# Patient Record
Sex: Male | Born: 1995 | Race: Black or African American | Hispanic: No | Marital: Single | Smoking: Current every day smoker
Health system: Southern US, Community
[De-identification: ages and names within clinical notes are randomized; demographics above are authoritative.]

## PROBLEM LIST (undated history)

## (undated) DIAGNOSIS — T7840XA Allergy, unspecified, initial encounter: Secondary | ICD-10-CM

## (undated) DIAGNOSIS — F32A Depression, unspecified: Secondary | ICD-10-CM

## (undated) DIAGNOSIS — K611 Rectal abscess: Secondary | ICD-10-CM

## (undated) DIAGNOSIS — R51 Headache: Secondary | ICD-10-CM

## (undated) DIAGNOSIS — F909 Attention-deficit hyperactivity disorder, unspecified type: Secondary | ICD-10-CM

## (undated) DIAGNOSIS — F419 Anxiety disorder, unspecified: Secondary | ICD-10-CM

## (undated) DIAGNOSIS — F329 Major depressive disorder, single episode, unspecified: Secondary | ICD-10-CM

## (undated) DIAGNOSIS — F319 Bipolar disorder, unspecified: Secondary | ICD-10-CM

## (undated) DIAGNOSIS — D849 Immunodeficiency, unspecified: Secondary | ICD-10-CM

## (undated) HISTORY — PX: TYMPANOSTOMY TUBE PLACEMENT: SHX32

---
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
## 2016-03-20 ENCOUNTER — Ambulatory Visit (HOSPITAL_COMMUNITY)
Admission: EM | Admit: 2016-03-20 | Discharge: 2016-03-20 | Disposition: A | Discharge status: Home / Self Care | Payer: Managed Care, Other (non HMO) | Attending: Emergency Medicine | Admitting: Emergency Medicine

## 2016-03-20 ENCOUNTER — Encounter (HOSPITAL_COMMUNITY): Payer: Self-pay | Admitting: *Deleted

## 2016-03-20 DIAGNOSIS — H66001 Acute suppurative otitis media without spontaneous rupture of ear drum, right ear: Secondary | ICD-10-CM

## 2016-03-20 HISTORY — DX: Major depressive disorder, single episode, unspecified: F32.9

## 2016-03-20 HISTORY — DX: Depression, unspecified: F32.A

## 2016-03-20 HISTORY — DX: Bipolar disorder, unspecified: F31.9

## 2016-03-20 MED ORDER — CEFDINIR 300 MG PO CAPS: 300.0000 mg | ORAL_CAPSULE | Freq: Two times a day (BID) | ORAL | 0 refills | Status: AC

## 2016-03-20 NOTE — Discharge Instructions (Signed)
You have an ear infection. Take Omnicef twice a day for 10 days. You can do Tylenol, Advil, or ibuprofen as needed for pain. This should improve over the next 24-48 hours. Follow-up as needed.

## 2016-03-20 NOTE — ED Triage Notes (Signed)
Started approx 1 wk ago with "head cold" and minor cough.  Last night started with bilat earache - R>L with decreased hearing.  Denies fevers.  Has been taking Benadryl, Contact.

## 2016-03-20 NOTE — ED Provider Notes (Signed)
[facility]    CSN: [id number] Arrival date & time: 03/20/16  1231     History   Chief Complaint Chief Complaint  Patient presents with  . Otalgia  . Nasal Congestion    HPI Devon Hernandez is a 20 y.o. male.   HPI   Patient is a 20 y.o. male who presents with cough and congestion x 1 week and otalgia since last night.  Cough started out productive of green sputum which has now turned yellow. Congestion is associated with clear nasal drainage. Right ear pain began last night, followed by left ear pain.  Right ear pain has been a constant 9/10 pain while left comes and goes.  Also notes loss of hearing in right ear. Nothing aggravates or relieves the pain. Has taken Benadryl with little relief. Hx of ear infection 7 months ago which he was treated for in the ED. No known sick contacts. No history of asthma. Smokes two black and milds per day x 5 years. Denies any fever, ST, chest pain, shortness of breath, abdominal pain, nausea, vomiting, diarrhea.   Past Medical History:  Diagnosis Date  . ADHD (attention deficit hyperactivity disorder)   . Allergy   . Anxiety   . Bipolar disorder ([place])   . Depression   . [id number])     Patient Active Problem List   Diagnosis Date Noted  . Polysubstance abuse 07/11/2013  . Altered mental status 07/09/2013  . MDD (major depressive disorder), recurrent episode, severe ([place]) 01/18/2013  . ADHD (attention deficit hyperactivity disorder), combined type 01/18/2013  . Conduct disorder, 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    Past Surgical History:  Procedure Laterality Date  . TYMPANOSTOMY TUBE PLACEMENT         Home Medications    Prior to Admission medications   Medication Sig Start Date End Date Taking? Authorizing Provider  cholecalciferol (VITAMIN D) 1000 units tablet Take 1,000 Units by mouth daily.   Yes Historical Provider, MD  docusate sodium (COLACE) 100 MG capsule Take 100 mg by mouth 2 (two) times daily as  needed for mild constipation.    Yes Historical Provider, MD  fluticasone (FLONASE) 50 MCG/ACT nasal spray Place 1 spray into both nostrils daily. As needed for congestion 09/11/15  Yes Roxanna Mew, PA-C  lamoTRIgine (LAMICTAL) 100 MG tablet Take 100 mg by mouth 2 (two) times daily.   Yes Historical Provider, MD  lithium carbonate 300 MG capsule Take 300 mg by mouth 3 (three) times daily with meals.   Yes Historical Provider, MD  MIRTAZAPINE PO Take by mouth.   Yes Historical Provider, MD  cefdinir (OMNICEF) 300 MG capsule Take 1 capsule (300 mg total) by mouth 2 (two) times daily. 03/20/16 03/30/16  Melony Overly, MD  Misc. Devices (HIBICLENS HAND PUMP 32OZ) MISC Bathe or shower using Hibiclens once a week 02/14/14   Lutricia Feil, PA    Family History Family History  Problem Relation Age of Onset  . Depression Mother   . Hypertension Mother   . Depression Father     Hx: PTSD  . Diabetes Maternal Aunt   . AVM Maternal Grandmother   . Aneurysm Maternal Grandmother   . Seizures Maternal Grandmother     Social History Social History  Substance Use Topics  . Smoking status: Current Every Day Smoker    Types: Cigars  . Smokeless tobacco: Never Used  . Alcohol use Yes     Comment: occasionally  Allergies   Amoxicillin and Penicillins   Review of Systems Review of Systems  Constitutional: Negative for activity change, chills, fatigue and fever.  HENT: Positive for congestion, ear pain, hearing loss and postnasal drip. Negative for ear discharge, sinus pain, sneezing, sore throat and tinnitus.   Eyes: Negative for pain.  Respiratory: Positive for cough. Negative for chest tightness, shortness of breath and wheezing.   Cardiovascular: Negative for chest pain.  Gastrointestinal: Negative for abdominal pain, constipation, diarrhea, nausea and vomiting.  Genitourinary: Negative for dysuria and frequency.  Musculoskeletal: Negative for neck stiffness.    Neurological: Negative for dizziness.     Physical Exam Triage Vital Signs ED Triage Vitals  Enc Vitals Group     BP 03/20/16 1334 132/83     Pulse Rate 03/20/16 1334 60     Resp 03/20/16 1334 16     Temp 03/20/16 1334 98.9 F (37.2 C)     Temp Source 03/20/16 1334 Oral     SpO2 03/20/16 1334 100 %     Weight --      Height --      Head Circumference --      Peak Flow --      Pain Score 03/20/16 1340 9     Pain Loc --      Pain Edu? --      Excl. in [place]? --    No data found.   Updated Vital Signs BP 132/83 (BP Location: Left Arm)   Pulse 60   Temp 98.9 F (37.2 C) (Oral)   Resp 16   SpO2 100%   Visual Acuity Right Eye Distance:   Left Eye Distance:   Bilateral Distance:    Right Eye Near:   Left Eye Near:    Bilateral Near:     Physical Exam  Constitutional: He is oriented to person, place, and time. He appears well-developed and well-nourished. No distress.  HENT:  Head: Normocephalic and atraumatic.  Right Ear: External ear and ear canal normal. There is tenderness. Tympanic membrane is erythematous and bulging. A middle ear effusion is present. Decreased hearing is noted.  Left Ear: External ear and ear canal normal. Tympanic membrane is erythematous. A middle ear effusion (clear effusion) is present.  Eyes: EOM are normal. Right eye exhibits no discharge. Left eye exhibits no discharge.  Neck: Normal range of motion. Neck supple.  Cardiovascular: Normal rate, regular rhythm and normal heart sounds.   Pulmonary/Chest: Effort normal and breath sounds normal. No respiratory distress. He has no wheezes. He has no rales.  Musculoskeletal: Normal range of motion.  Lymphadenopathy:    He has no cervical adenopathy.  Neurological: He is alert and oriented to person, place, and time.  Skin: Skin is warm and dry. He is not diaphoretic.  Psychiatric: He has a normal mood and affect. His behavior is normal. Judgment and thought content normal.  Nursing note and  vitals reviewed.    UC Treatments / Results  Labs (all labs ordered are listed, but only abnormal results are displayed) Labs Reviewed - No data to display  EKG  EKG Interpretation None       Radiology No results found.  Procedures Procedures (including critical care time)  Medications Ordered in UC Medications - No data to display   Initial Impression / Assessment and Plan / UC Course  I have reviewed the triage vital signs and the nursing notes.  Pertinent labs & imaging results that were available during my care of  the patient were reviewed by me and considered in my medical decision making (see chart for details).   Clinical Course    20 y.o. Presents for cough, congestion, and ear pain.  Cefdinir 300 mg bid x 10 days prescribed today for OM.   Advised patient that he has an ear infection. Take Omnicef twice a day for 10 days. Advised that he can do Tylenol, Advil, or ibuprofen as needed for pain. This should improve over the next 24-48 hours. Follow-up as needed.  Patient seen and examined with PA student. Note reviewed and edited as needed.  Final Clinical Impressions(s) / UC Diagnoses   Final diagnoses:  Acute suppurative otitis media of right ear without spontaneous rupture of tympanic membrane, recurrence not specified    New Prescriptions Discharge Medication List as of 03/20/2016  2:34 PM       Melony Overly, MD 03/20/16 2056

## 2016-04-21 ENCOUNTER — Emergency Department (HOSPITAL_COMMUNITY)
Admission: EM | Admit: 2016-04-21 | Discharge: 2016-04-21 | Disposition: A | Discharge status: Home / Self Care | Payer: Managed Care, Other (non HMO) | Attending: Emergency Medicine | Admitting: Emergency Medicine

## 2016-04-21 ENCOUNTER — Encounter (HOSPITAL_COMMUNITY): Payer: Self-pay | Admitting: Nurse Practitioner

## 2016-04-21 DIAGNOSIS — K6289 Other specified diseases of anus and rectum: Secondary | ICD-10-CM | POA: Diagnosis present

## 2016-04-21 DIAGNOSIS — F17290 Nicotine dependence, other tobacco product, uncomplicated: Secondary | ICD-10-CM | POA: Insufficient documentation

## 2016-04-21 DIAGNOSIS — F909 Attention-deficit hyperactivity disorder, unspecified type: Secondary | ICD-10-CM | POA: Insufficient documentation

## 2016-04-21 DIAGNOSIS — K5900 Constipation, unspecified: Secondary | ICD-10-CM | POA: Diagnosis not present

## 2016-04-21 DIAGNOSIS — K644 Residual hemorrhoidal skin tags: Secondary | ICD-10-CM

## 2016-04-21 MED ORDER — HYDROCORTISONE 2.5 % RE CREA: TOPICAL_CREAM | RECTAL | 0 refills | Status: DC

## 2016-04-21 MED ORDER — DOCUSATE SODIUM 100 MG PO CAPS: 100.0000 mg | ORAL_CAPSULE | Freq: Two times a day (BID) | ORAL | 0 refills | Status: DC

## 2016-04-21 MED ORDER — MAGNESIUM CITRATE PO SOLN
1.0000 | Freq: Once | ORAL | Status: AC
  Administered 2016-04-21: 1 via ORAL
  Filled 2016-04-21: qty 296

## 2016-04-21 NOTE — ED Triage Notes (Signed)
Pt presents with c/o rectal pain.he reports a four day history of constipation and straining to move his bowels. He noticed a painful "bump" to rectal area a few days ago that has become increasingly painful and swollen since he has been straining.

## 2016-04-21 NOTE — ED Notes (Signed)
I&D tray at bedside.

## 2016-04-21 NOTE — Discharge Instructions (Signed)
It was my pleasure taking care of you today!   Increase hydration and follow a high fiber diet. Use a stool softener such as Colace to help relieve pain with bowel movements. Sitz baths and Epsom salt baths will also help improve symptoms. Apply topical hydrocortisone cream twice daily.   If symptoms persist longer than 1 week, please follow up with your primary care provider. Please return to ER for new or worsening symptoms, any additional concerns.

## 2016-04-21 NOTE — ED Notes (Signed)
Pt is in stable condition upon d/c and ambulates from ED. 

## 2016-04-21 NOTE — ED Provider Notes (Signed)
[facility] DEPT Provider Note   CSN: [id number] Arrival date & time: 04/21/16  1115   By signing my name below, I, [name], attest that this documentation has been prepared under the direction and in the presence of [facility], PA-C. Electronically Signed: [name], Scribe. 04/21/16. 11:58 AM. History   Chief Complaint Chief Complaint  Patient presents with  . Rectal Pain    HPI HPI Comments: Devon Hernandez is a 20 y.o. male who presents to the Emergency Department complaining of a constant, gradually worsening painful bump to the right side of his rectum that he noticed 5 days ago. Pt has associated constipation with his last BM occurring 7 days ago. He is passing gas. Pt denies similar rectal pain in the past. He states standing alleviates his pain; sitting down or touching the area aggravates his pain. Pt denies any fevers or chills.   The history is provided by the patient. No language interpreter was used.    Past Medical History:  Diagnosis Date  . ADHD (attention deficit hyperactivity disorder)   . Allergy   . Anxiety   . Bipolar disorder ([place]on Hernandez)   . Depression   . [id number])     Patient Active Problem List   Diagnosis Date Noted  . Polysubstance abuse 07/11/2013  . Altered mental status 07/09/2013  . MDD (major depressive disorder), recurrent episode, severe (Devon Hernandez) 01/18/2013  . ADHD (attention deficit hyperactivity disorder), combined type 01/18/2013  . Conduct disorder, 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    Past Surgical History:  Procedure Laterality Date  . TYMPANOSTOMY TUBE PLACEMENT         Home Medications    Prior to Admission medications   Medication Sig Start Date End Date Taking? Authorizing Provider  cholecalciferol (VITAMIN D) 1000 units tablet Take 1,000 Units by mouth daily.    Historical Provider, MD  docusate sodium (COLACE) 100 MG capsule Take 1 capsule (100 mg total) by mouth every 12 (twelve) hours. 04/21/16   Jaime Pilcher  Ward, PA-C  fluticasone (FLONASE) 50 MCG/ACT nasal spray Place 1 spray into both nostrils daily. As needed for congestion 09/11/15   Roxanna Mew, PA-C  hydrocortisone (ANUSOL-HC) 2.5 % rectal cream Apply rectally 2 times daily 04/21/16   [facility] Ward, PA-C  lamoTRIgine (LAMICTAL) 100 MG tablet Take 100 mg by mouth 2 (two) times daily.    Historical Provider, MD  lithium carbonate 300 MG capsule Take 300 mg by mouth 3 (three) times daily with meals.    Historical Provider, MD  MIRTAZAPINE PO Take by mouth.    Historical Provider, MD  Misc. Devices (HIBICLENS HAND PUMP 32OZ) MISC Bathe or shower using Hibiclens once a week 02/14/14   Lutricia Feil, PA    Family History Family History  Problem Relation Age of Onset  . Depression Mother   . Hypertension Mother   . Depression Father     Hx: PTSD  . Diabetes Maternal Aunt   . AVM Maternal Grandmother   . Aneurysm Maternal Grandmother   . Seizures Maternal Grandmother     Social History Social History  Substance Use Topics  . Smoking status: Current Every Day Smoker    Types: Cigars  . Smokeless tobacco: Never Used  . Alcohol use Yes     Comment: occasionally     Allergies   Amoxicillin and Penicillins   Review of Systems Review of Systems  Constitutional: Negative for chills and fever.  Gastrointestinal: Positive for constipation and rectal  pain. Negative for abdominal pain and diarrhea.     Physical Exam Updated Vital Signs BP 137/86 (BP Location: Right Arm)   Pulse 67   Temp 97.9 F (36.6 C) (Oral)   Resp 14   SpO2 99%   Physical Exam  Constitutional: He is oriented to person, place, and time. He appears well-developed and well-nourished. No distress.  HENT:  Head: Normocephalic and atraumatic.  Cardiovascular: Normal rate, regular rhythm and normal heart sounds.   No murmur heard. Pulmonary/Chest: Effort normal and breath sounds normal. No respiratory distress.  Abdominal: Soft. He  exhibits no distension. There is no tenderness.  Genitourinary: Rectal exam shows external hemorrhoid and tenderness. Rectal exam shows no internal hemorrhoid and no fissure.  Musculoskeletal: He exhibits no edema.  Neurological: He is alert and oriented to person, place, and time.  Skin: Skin is warm and dry.  Nursing note and vitals reviewed.    ED Treatments / Results   DIAGNOSTIC STUDIES: Oxygen Saturation is 99% on room air, normal by my interpretation.    COORDINATION OF CARE: 11:59 AM Discussed treatment plan with pt at bedside and pt agreed to plan.   Labs (all labs ordered are listed, but only abnormal results are displayed) Labs Reviewed - No data to display  EKG  EKG Interpretation None       Radiology No results found.  Procedures Procedures (including critical care time)  Medications Ordered in ED Medications  magnesium citrate solution 1 Bottle (1 Bottle Oral Given 04/21/16 1207)     Initial Impression / Assessment and Plan / ED Course  I have reviewed the triage vital signs and the nursing notes.  Pertinent labs & imaging results that were available during my care of the patient were reviewed by me and considered in my medical decision making (see chart for details).  Clinical Course    Devon Hernandez is a 20 y.o. male who presents to ED for external hemorrhoid and constipation. Bowel health discussed. Mag citrate given in ED. Stool softener and hydrocortisone rectal cream provided. Miralax PRN. Follow up with PCP if symptoms persist. Return precautions discussed and all questions answered.    Final Clinical Impressions(s) / ED Diagnoses   Final diagnoses:  External hemorrhoid  Constipation, unspecified constipation type    New Prescriptions Discharge Medication List as of 04/21/2016 12:01 PM    START taking these medications   Details  hydrocortisone (ANUSOL-HC) 2.5 % rectal cream Apply rectally 2 times daily, Print       I personally  performed the services described in this documentation, which was scribed in my presence. The recorded information has been reviewed and is accurate.    [facility] Ward, PA-C 04/21/16 1323    Tanna Furry, MD 04/27/16 [phone]

## 2016-06-20 ENCOUNTER — Ambulatory Visit (HOSPITAL_COMMUNITY)
Admission: EM | Admit: 2016-06-20 | Discharge: 2016-06-20 | Disposition: A | Discharge status: Home / Self Care | Payer: PRIVATE HEALTH INSURANCE | Attending: Internal Medicine | Admitting: Internal Medicine

## 2016-06-20 ENCOUNTER — Encounter (HOSPITAL_COMMUNITY): Payer: Self-pay | Admitting: Emergency Medicine

## 2016-06-20 DIAGNOSIS — H00014 Hordeolum externum left upper eyelid: Secondary | ICD-10-CM | POA: Diagnosis not present

## 2016-06-20 MED ORDER — ERYTHROMYCIN 5 MG/GM OP OINT: TOPICAL_OINTMENT | OPHTHALMIC | 0 refills | Status: DC

## 2016-06-20 NOTE — ED Provider Notes (Signed)
CSN: [id number]     Arrival date & time 06/20/16  1806 History   None    Chief Complaint  Patient presents with  . Eye Pain   (Consider location/radiation/quality/duration/timing/severity/associated sxs/prior Treatment) 21 year old male patient presents to clinic with 24-hour history of swelling in the left eye lid. He denies any changes in his patient, no eye pain, no light sensitivity, no eye discharge or crusting. He denies any trauma to the area, however he has been wearing nonprescription contact lenses for cosmetic purposes.   The history is provided by the patient.    Past Medical History:  Diagnosis Date  . ADHD (attention deficit hyperactivity disorder)   . Allergy   . Anxiety   . Bipolar disorder ([place])   . Depression   . [id number])    Past Surgical History:  Procedure Laterality Date  . TYMPANOSTOMY TUBE PLACEMENT     Family History  Problem Relation Age of Onset  . Depression Mother   . Hypertension Mother   . Depression Father     Hx: PTSD  . Diabetes Maternal Aunt   . AVM Maternal Grandmother   . Aneurysm Maternal Grandmother   . Seizures Maternal Grandmother    Social History  Substance Use Topics  . Smoking status: Current Every Day Smoker    Types: Cigars  . Smokeless tobacco: Never Used  . Alcohol use Yes     Comment: occasionally    Review of Systems  Reason unable to perform ROS: as covered in HPI.  All other systems reviewed and are negative.   Allergies  Amoxicillin and Penicillins  Home Medications   Prior to Admission medications   Medication Sig Start Date End Date Taking? Authorizing Provider  erythromycin ophthalmic ointment Place a 1/2 inch ribbon of ointment into the lower eyelid 2-3 times a day. 06/20/16   Barnet Glasgow, NP   Meds Ordered and Administered this Visit  Medications - No data to display  BP 126/73 (BP Location: Right Arm)   Pulse 68   Temp 98.4 F (36.9 C) (Oral)   Resp 16   SpO2 100%  No data  found.   Physical Exam  Constitutional: He is oriented to person, place, and time. He appears well-developed and well-nourished. No distress.  HENT:  Head: Normocephalic and atraumatic.  Right Ear: External ear normal.  Left Ear: External ear normal.  Mouth/Throat: Oropharynx is clear and moist.  Eyes: Pupils are equal, round, and reactive to light. Lids are everted and swept, no foreign bodies found. Left eye exhibits hordeolum. Left eye exhibits no discharge and no exudate. Right conjunctiva is not injected. Right conjunctiva has no hemorrhage. Left conjunctiva is not injected. Left conjunctiva has no hemorrhage. Right eye exhibits normal extraocular motion. Left eye exhibits normal extraocular motion.  Hordeolum present left upper eyelid.  Neurological: He is alert and oriented to person, place, and time.  Skin: Skin is warm and dry. Capillary refill takes less than 2 seconds. He is not diaphoretic.  Psychiatric: He has a normal mood and affect.  Nursing note and vitals reviewed.   Urgent Care Course     Procedures (including critical care time)  Labs Review Labs Reviewed - No data to display  Imaging Review No results found.   Visual Acuity Review  Right Eye Distance: 20/50 Left Eye Distance: 20/40 Bilateral Distance: 20/40  Right Eye Near:   Left Eye Near:    Bilateral Near:         MDM  1. Hordeolum externum of left upper eyelid    You have a style in your left upper eye lid. Apply a warm compress for 15 minutes at a time 4 times a day. I have also prescribed erythromycin eye ointment, apply a 1/2 inch strip to your eye 2-3 times a day. Should your symptoms fail to resolve, follow up with your primary care provider or an ophthalmologist as needed.      Barnet Glasgow, NP 06/20/16 2010

## 2016-06-20 NOTE — Discharge Instructions (Signed)
You have a style in your left upper eye lid. Apply a warm compress for 15 minutes at a time 4 times a day. I have also prescribed erythromycin eye ointment, apply a 1/2 inch strip to your eye 2-3 times a day. Should your symptoms fail to resolve, follow up with your primary care provider or an ophthalmologist as needed.

## 2016-06-20 NOTE — ED Triage Notes (Signed)
The patient presented to the [facility] with a complaint of left eye pain and swelling that started today.

## 2017-02-28 ENCOUNTER — Ambulatory Visit (HOSPITAL_COMMUNITY)
Admission: EM | Admit: 2017-02-28 | Discharge: 2017-02-28 | Disposition: A | Discharge status: Home / Self Care | Payer: 59 | Attending: Family Medicine | Admitting: Family Medicine

## 2017-02-28 ENCOUNTER — Encounter (HOSPITAL_COMMUNITY): Payer: Self-pay | Admitting: Emergency Medicine

## 2017-02-28 DIAGNOSIS — Z9889 Other specified postprocedural states: Secondary | ICD-10-CM | POA: Diagnosis not present

## 2017-02-28 DIAGNOSIS — K629 Disease of anus and rectum, unspecified: Secondary | ICD-10-CM | POA: Insufficient documentation

## 2017-02-28 DIAGNOSIS — F419 Anxiety disorder, unspecified: Secondary | ICD-10-CM | POA: Insufficient documentation

## 2017-02-28 DIAGNOSIS — Z8249 Family history of ischemic heart disease and other diseases of the circulatory system: Secondary | ICD-10-CM | POA: Insufficient documentation

## 2017-02-28 DIAGNOSIS — F319 Bipolar disorder, unspecified: Secondary | ICD-10-CM | POA: Insufficient documentation

## 2017-02-28 DIAGNOSIS — R4182 Altered mental status, unspecified: Secondary | ICD-10-CM | POA: Diagnosis not present

## 2017-02-28 DIAGNOSIS — Z833 Family history of diabetes mellitus: Secondary | ICD-10-CM | POA: Insufficient documentation

## 2017-02-28 DIAGNOSIS — Z818 Family history of other mental and behavioral disorders: Secondary | ICD-10-CM | POA: Insufficient documentation

## 2017-02-28 DIAGNOSIS — Z79899 Other long term (current) drug therapy: Secondary | ICD-10-CM | POA: Diagnosis not present

## 2017-02-28 DIAGNOSIS — F17290 Nicotine dependence, other tobacco product, uncomplicated: Secondary | ICD-10-CM | POA: Diagnosis not present

## 2017-02-28 DIAGNOSIS — K649 Unspecified hemorrhoids: Secondary | ICD-10-CM | POA: Insufficient documentation

## 2017-02-28 DIAGNOSIS — Z820 Family history of epilepsy and other diseases of the nervous system: Secondary | ICD-10-CM | POA: Diagnosis not present

## 2017-02-28 DIAGNOSIS — F909 Attention-deficit hyperactivity disorder, unspecified type: Secondary | ICD-10-CM | POA: Insufficient documentation

## 2017-02-28 DIAGNOSIS — Z880 Allergy status to penicillin: Secondary | ICD-10-CM | POA: Diagnosis not present

## 2017-02-28 NOTE — Discharge Instructions (Signed)
May continue to use preparation h to rectal lesion for symptoms. We will notify you have any positive test results. Please follow on your MyChart. Keep stools soft. Push fluids. If symptoms persist or do not improve follow up with your primary care provider

## 2017-02-28 NOTE — ED Provider Notes (Signed)
[facility]    CSN: [id number] Arrival date & time: 02/28/17  1326     History   Chief Complaint Chief Complaint  Patient presents with  . Hemorrhoids    HPI Devon Hernandez is a 21 y.o. male.   Simran presents with complaints of rectal pain and itching which has been present for the past three weeks. Pain with having BM and with wiping of the area. He states he has multiple loose stools a day. Has had hemorrhoids in the past but states this feels different. Tried preparation h and it caused burning so has not used since. Denies abdominal pain nausea vomiting. He has felt fatigued. Denies penile lesions or discharge. Without fevers. He has not been sexually active in 6 months. He has been sexually active with males, and has engaged in anal intercourse. Denies previous std's or known exposure.   ROS per HPI.       Past Medical History:  Diagnosis Date  . ADHD (attention deficit hyperactivity disorder)   . Allergy   . Anxiety   . Bipolar disorder ([place])   . Depression   . NWGNFAOZ(308.6)     Patient Active Problem List   Diagnosis Date Noted  . Polysubstance abuse ([place]) 07/11/2013  . Altered mental status 07/09/2013  . MDD (major depressive disorder), recurrent episode, severe ([place]) 01/18/2013  . ADHD (attention deficit hyperactivity disorder), combined type 01/18/2013  . Conduct disorder, 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    Past Surgical History:  Procedure Laterality Date  . TYMPANOSTOMY TUBE PLACEMENT         Home Medications    Prior to Admission medications   Medication Sig Start Date End Date Taking? Authorizing Provider  LamoTRIgine (LAMICTAL PO) Take by mouth.   Yes [provider]  LITHIUM PO Take by mouth.   Yes [provider]  Mirtazapine (REMERON PO) Take by mouth.   Yes [provider]  erythromycin ophthalmic ointment Place a 1/2 inch ribbon of ointment into the lower eyelid 2-3 times a day. 06/20/16   Barnet Glasgow, NP    Family History Family History  Problem Relation Age of Onset  . Depression Mother   . Hypertension Mother   . Depression Father        Hx: PTSD  . Diabetes Maternal Aunt   . AVM Maternal Grandmother   . Aneurysm Maternal Grandmother   . Seizures Maternal Grandmother     Social History Social History  Substance Use Topics  . Smoking status: Current Every Day Smoker    Types: Cigars  . Smokeless tobacco: Never Used  . Alcohol use Yes     Comment: occasionally     Allergies   Amoxicillin and Penicillins   Review of Systems Review of Systems   Physical Exam Triage Vital Signs ED Triage Vitals  Enc Vitals Group     BP 02/28/17 1356 (!) 121/58     Pulse Rate 02/28/17 1356 84     Resp 02/28/17 1356 18     Temp 02/28/17 1356 98.8 F (37.1 C)     Temp Source 02/28/17 1356 Oral     SpO2 02/28/17 1356 100 %     Weight --      Height --      Head Circumference --      Peak Flow --      Pain Score 02/28/17 1355 5     Pain Loc --      Pain Edu? --  Excl. in GC? --    No data found.   Updated Vital Signs BP (!) 121/58 (BP Location: Left Arm)   Pulse 84   Temp 98.8 F (37.1 C) (Oral)   Resp 18   SpO2 100%   Visual Acuity Right Eye Distance:   Left Eye Distance:   Bilateral Distance:    Right Eye Near:   Left Eye Near:    Bilateral Near:     Physical Exam  Constitutional: He is oriented to person, place, and time. He appears well-developed and well-nourished. No distress.  Cardiovascular: Normal rate and regular rhythm.   Pulmonary/Chest: Effort normal and breath sounds normal.  Abdominal: Soft. There is no tenderness.  Genitourinary:     Genitourinary Comments: Approximately 1.5 cm flat raised lesion near rectum, appears to be open and moist. Light pink in color.   Neurological: He is oriented to person, place, and time.  Skin: Skin is warm and dry.  Vitals reviewed.    UC Treatments / Results  Labs (all labs ordered are  listed, but only abnormal results are displayed) Labs Reviewed  HSV CULTURE AND TYPING  URINE CYTOLOGY ANCILLARY ONLY    EKG  EKG Interpretation None       Radiology No results found.  Procedures Procedures (including critical care time)  Medications Ordered in UC Medications - No data to display   Initial Impression / Assessment and Plan / UC Course  I have reviewed the triage vital signs and the nursing notes.  Pertinent labs & imaging results that were available during my care of the patient were reviewed by me and considered in my medical decision making (see chart for details).     Swabbed lesion for HSV at this time. Discussed external hemorrhoid vs herpes. Obtained urine STI screen as well as patient unsure when last screen was completed. Recommended supportive cares at this time, preparation h, sitz baths. Will notify of any positive findings. If symptoms worsen or do not improve in the next week to return to be seen or to follow up with PCP. Patient verbalized understanding and agreeable to plan.    Zigmund Gottron, NP 02/28/2017 2:52 PM   Final Clinical Impressions(s) / UC Diagnoses   Final diagnoses:  Rectal lesion    New Prescriptions New Prescriptions   No medications on file     Controlled Substance Prescriptions [place] Controlled Substance Registry consulted? Not Applicable   Zigmund Gottron, NP 02/28/17 1452

## 2017-02-28 NOTE — ED Notes (Signed)
Patient just walked into lobby from outside

## 2017-02-28 NOTE — ED Triage Notes (Addendum)
History of hemorrhoids and says this is what he thinks is going on today.  Patient has multiple complaints.

## 2017-03-01 LAB — URINE CYTOLOGY ANCILLARY ONLY
Chlamydia: NEGATIVE
Neisseria Gonorrhea: NEGATIVE
TRICH (WINDOWPATH): NEGATIVE

## 2017-03-02 LAB — HSV CULTURE AND TYPING

## 2017-03-30 ENCOUNTER — Ambulatory Visit (INDEPENDENT_AMBULATORY_CARE_PROVIDER_SITE_OTHER): Payer: 59 | Admitting: Emergency Medicine

## 2017-03-30 ENCOUNTER — Other Ambulatory Visit: Payer: Self-pay

## 2017-03-30 ENCOUNTER — Encounter: Payer: Self-pay | Admitting: Emergency Medicine

## 2017-03-30 VITALS — BP 120/80 | HR 86 | Temp 98.2°F | Resp 16 | Ht 70.0 in | Wt 131.4 lb

## 2017-03-30 DIAGNOSIS — A630 Anogenital (venereal) warts: Secondary | ICD-10-CM | POA: Diagnosis not present

## 2017-03-30 DIAGNOSIS — B079 Viral wart, unspecified: Secondary | ICD-10-CM | POA: Diagnosis not present

## 2017-03-30 DIAGNOSIS — K6289 Other specified diseases of anus and rectum: Secondary | ICD-10-CM | POA: Insufficient documentation

## 2017-03-30 NOTE — Patient Instructions (Addendum)
     IF you received an x-ray today, you will receive an invoice from [facility]. Please contact [facility] at [phone] with questions or concerns regarding your invoice.   IF you received labwork today, you will receive an invoice from [facility]. Please contact [facility] at [phone] with questions or concerns regarding your invoice.   Our billing staff will not be able to assist you with questions regarding bills from these companies.  You will be contacted with the lab results as soon as they are available. The fastest way to get your results is to activate your My Chart account. Instructions are located on the last page of this paperwork. If you have not heard from [place] regarding the results in 2 weeks, please contact this office.      Human Papillomavirus Human papillomavirus (HPV) is the most common sexually transmitted infection (STI). It is easy to pass it from person to person (contagious). HPV can cause cervical cancer, anal cancer, and genital warts. The genital warts can be seen and felt. Also, there may be wartlike regions in the throat. HPV may not have any symptoms. It is possible to have HPV for a long time and not know it. You may pass HPV on to others without knowing it. Follow these instructions at home:  Take medicines as told by your doctor.  Use over-the-counter creams for itching as told by your doctor.  Keep all follow-up visits. Make sure to get Pap tests as told by your doctor.  Do not touch or scratch the warts.  Do not treat genital warts with medicines used for treating hand warts.  Do not have sex while you are getting treatment.  Do not douche or use tampons during treatment of HPV.  Tell your sex partner about your infection because he or she may also need treatment.  If you get pregnant, tell your doctor that you had HPV. Your doctor will watch your pregnancy closely. This is important to keep your baby safe.  After  treatment, use condoms during sex to prevent future infections.  Have only one sex partner.  Have a sex partner who does not have other sex partners. Contact a doctor if:  The treated skin is red, swollen, or painful.  You have a fever.  You feel ill.  You feel lumps or pimple-like areas in and around your genital area.  You have bleeding of the vagina or the area that was treated.  You have pain during sex. This information is not intended to replace advice given to you by your health care provider. Make sure you discuss any questions you have with your health care provider. Document Released: 03/31/2008 Document Revised: 09/24/2015 Document Reviewed: 07/24/2013 Elsevier Interactive Patient Education  2017 [employer].

## 2017-03-30 NOTE — Progress Notes (Signed)
Devon Hernandez 21 y.o.   Chief Complaint  Patient presents with  . Rectal Pain    x one month, itches     HISTORY OF PRESENT ILLNESS: This is a 21 y.o. male complaining of itchy tender growths to perianal and scrotal areas x several months.  HPI   Prior to Admission medications   Medication Sig Start Date End Date Taking? Authorizing Provider  LamoTRIgine (LAMICTAL PO) Take by mouth.   Yes [provider]  LITHIUM PO Take by mouth.   Yes [provider]  Mirtazapine (REMERON PO) Take by mouth.   Yes [provider]  erythromycin ophthalmic ointment Place a 1/2 inch ribbon of ointment into the lower eyelid 2-3 times a day. Patient not taking: Reported on 03/30/2017 06/20/16   Barnet Glasgow, NP    Allergies  Allergen Reactions  . Amoxicillin Hives and Diarrhea  . Penicillins     Per Dad    Patient Active Problem List   Diagnosis Date Noted  . Polysubstance abuse ([place]) 07/11/2013  . Altered mental status 07/09/2013  . MDD (major depressive disorder), recurrent episode, severe ([place]) 01/18/2013  . ADHD (attention deficit hyperactivity disorder), combined type 01/18/2013  . Conduct disorder, 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    Past Medical History:  Diagnosis Date  . ADHD (attention deficit hyperactivity disorder)   . Allergy   . Anxiety   . Bipolar disorder ([place])   . Depression   . UXLKGMWN(027.2)     Past Surgical History:  Procedure Laterality Date  . TYMPANOSTOMY TUBE PLACEMENT      Social History   Socioeconomic History  . Marital status: Single    Spouse name: Not on file  . Number of children: Not on file  . Years of education: Not on file  . Highest education level: Not on file  Social Needs  . Financial resource strain: Not on file  . Food insecurity - worry: Not on file  . Food insecurity - inability: Not on file  . Transportation needs - medical: Not on file  . Transportation needs - non-medical: Not on file    Occupational History  . Not on file  Tobacco Use  . Smoking status: Current Every Day Smoker    Types: Cigars  . Smokeless tobacco: Never Used  Substance and Sexual Activity  . Alcohol use: Yes    Comment: occasionally  . Drug use: No    Comment: Hx trying THC and smoking marijuana per record  . Sexual activity: No  Other Topics Concern  . Not on file  Social History Narrative   Mom currently has full custody but Devon Hernandez has gone to live with his dad. Tonight was the first night that he was at dad's house. He admitted to marijuana use tonight, unclear when. Smoke exposure at home. No pets at home.    Family History  Problem Relation Age of Onset  . Depression Mother   . Hypertension Mother   . Depression Father        Hx: PTSD  . Diabetes Maternal Aunt   . AVM Maternal Grandmother   . Aneurysm Maternal Grandmother   . Seizures Maternal Grandmother      Review of Systems  Constitutional: Negative for chills and fever.  HENT: Negative for sore throat.   Eyes: Negative for discharge and redness.  Respiratory: Negative for cough and shortness of breath.   Cardiovascular: Negative for palpitations.  Gastrointestinal: Negative for abdominal pain, diarrhea, nausea and vomiting.  Genitourinary: Negative for dysuria, frequency and hematuria.  Skin: Positive for itching and rash.  Neurological: Negative.  Negative for dizziness and headaches.  Endo/Heme/Allergies: Negative.   All other systems reviewed and are negative.  Vitals:   03/30/17 1601  BP: 120/80  Pulse: 86  Resp: 16  Temp: 98.2 F (36.8 C)  SpO2: 98%     Physical Exam  Constitutional: He appears well-developed and well-nourished.  HENT:  Head: Normocephalic and atraumatic.  Eyes: Conjunctivae are normal. Pupils are equal, round, and reactive to light.  Neck: Normal range of motion.  Cardiovascular: Normal rate.  Pulmonary/Chest: Effort normal.  Genitourinary:  Genitourinary Comments: +HPV-like lesions  to perianal area; scrotal area shows hypopigmented areas and some raised lesions   Musculoskeletal: Normal range of motion.  Neurological: He is alert.  Skin: Skin is warm and dry. Capillary refill takes less than 2 seconds.  Psychiatric: He has a normal mood and affect. His behavior is normal.  Vitals reviewed.    ASSESSMENT & PLAN: Devon Hernandez was seen today for rectal pain.  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 Please contact [facility] Radiology at [phone] with questions or concerns regarding your invoice.   IF you received labwork today, you will receive an invoice from [facility]. Please contact [facility] at [phone] with questions or concerns regarding your invoice.   Our billing staff will not be able to assist you with questions regarding bills from these companies.  You will be contacted with the lab results as soon as they are available. The fastest way to get your results is to activate your My Chart account. Instructions are located on the last page of this paperwork. If you have not heard from [place] regarding the results in 2 weeks, please contact this office.      Human Papillomavirus Human papillomavirus (HPV) is the most common sexually transmitted infection (STI). It is easy to pass it from person to person (contagious). HPV can cause cervical cancer, anal cancer, and genital warts. The genital warts can be seen and felt. Also, there may be wartlike regions in the throat. HPV may not have any symptoms. It is possible to have HPV for a long time and not know it. You may pass HPV on to others without knowing it. Follow these instructions at home:  Take medicines as told by your doctor.  Use  over-the-counter creams for itching as told by your doctor.  Keep all follow-up visits. Make sure to get Pap tests as told by your doctor.  Do not touch or scratch the warts.  Do not treat genital warts with medicines used for treating hand warts.  Do not have sex while you are getting treatment.  Do not douche or use tampons during treatment of HPV.  Tell your sex partner about your infection because he or she may also need treatment.  If you get pregnant, tell your doctor that you had HPV. Your doctor will watch your pregnancy closely. This is important to keep your baby safe.  After treatment, use condoms during sex to prevent future infections.  Have only one sex partner.  Have a sex partner who does not have other sex partners. Contact a  doctor if:  The treated skin is red, swollen, or painful.  You have a fever.  You feel ill.  You feel lumps or pimple-like areas in and around your genital area.  You have bleeding of the vagina or the area that was treated.  You have pain during sex. This information is not intended to replace advice given to you by your health care provider. Make sure you discuss any questions you have with your health care provider. Document Released: 03/31/2008 Document Revised: 09/24/2015 Document Reviewed: 07/24/2013 Elsevier Interactive Patient Education  2017 Elsevier Inc.      Agustina Caroli, MD [facility]

## 2017-04-03 ENCOUNTER — Telehealth: Payer: Self-pay | Admitting: Emergency Medicine

## 2017-04-03 NOTE — Telephone Encounter (Signed)
Copied from [place] [phone]. Topic: Referral - Status >> Apr 03, 2017 11:39 AM Burnis Medin, NT wrote: Reason for CRM: Pt. Called back about wanting to check the status of his referral to the Dermatologist. Also pt wanted to know if the referral can be set up with someone that accepts medicaid. Pt would like a call back.

## 2017-04-06 NOTE — Telephone Encounter (Signed)
We are unable to refer with medicaid due to [place] not being primary care on medicaid card. I let pt know this and he was okay with using his other insurance. Sending to [place] and [place] [facility] on 12/6

## 2017-09-09 ENCOUNTER — Ambulatory Visit (HOSPITAL_COMMUNITY)
Admission: EM | Admit: 2017-09-09 | Discharge: 2017-09-09 | Disposition: A | Discharge status: Home / Self Care | Payer: Medicaid Other | Attending: Family Medicine | Admitting: Family Medicine

## 2017-09-09 ENCOUNTER — Other Ambulatory Visit: Payer: Self-pay

## 2017-09-09 ENCOUNTER — Encounter (HOSPITAL_COMMUNITY): Payer: Self-pay

## 2017-09-09 DIAGNOSIS — J029 Acute pharyngitis, unspecified: Secondary | ICD-10-CM

## 2017-09-09 MED ORDER — AZITHROMYCIN 250 MG PO TABS: 250.0000 mg | ORAL_TABLET | Freq: Every day | ORAL | 0 refills | Status: DC

## 2017-09-09 MED ORDER — IPRATROPIUM BROMIDE 0.06 % NA SOLN: 2.0000 | Freq: Four times a day (QID) | NASAL | 0 refills | Status: DC

## 2017-09-09 NOTE — ED Triage Notes (Signed)
Pt presents today with sore throat and what feels like a lump on the right side of his throat. Hurts to swallow and eat. Has had this problem for a month. Has a cough but no other symptoms. States last night he though his throat was going to close.

## 2017-09-09 NOTE — ED Provider Notes (Signed)
[place]    CSN: [id number] Arrival date & time: 09/09/17  1300     History   Chief Complaint Chief Complaint  Patient presents with  . Sore Throat    HPI Devon Hernandez is a 22 y.o. male.   22 year old male comes in for 1 month history of URI symptoms. States has had sore throat with worse to the right side. Also with cough. Denies rhinorrhea, nasal congestion. Denies fever, chills, night sweats. States felt worse last night as if throat was closing and came in for evaluation. Denies trouble breathing, shortness of breath, wheezing. Denies tripoding, drooling. Took aleve with good improvement. Current every day smoker.      Past Medical History:  Diagnosis Date  . ADHD (attention deficit hyperactivity disorder)   . Allergy   . Anxiety   . Bipolar disorder ([place])   . Depression   . MBTDHRCB(638.4)     Patient Active Problem List   Diagnosis Date Noted  . 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 Perianal wart 03/30/2017  . Polysubstance abuse ([place]) 07/11/2013  . Altered mental status 07/09/2013  . MDD (major depressive disorder), recurrent episode, severe ([place]) 01/18/2013  . ADHD (attention deficit hyperactivity disorder), combined type 01/18/2013  . Conduct disorder, 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    Past Surgical History:  Procedure Laterality Date  . TYMPANOSTOMY TUBE PLACEMENT         Home Medications    Prior to Admission medications   Medication Sig Start Date End Date Taking? Authorizing Provider  LamoTRIgine (LAMICTAL PO) Take by mouth.   Yes [provider]  LITHIUM PO Take by mouth.   Yes [provider]  Mirtazapine (REMERON PO) Take by mouth.   Yes [provider]  azithromycin (ZITHROMAX) 250 MG tablet Take 1 tablet (250 mg total) by mouth daily. Take first 2 tablets together, then 1 every day until finished. 09/09/17   Tasia Catchings, Jasmaine Rochel V, PA-C  ipratropium (ATROVENT) 0.06 % nasal spray Place 2  sprays into both nostrils 4 (four) times daily. 09/09/17   Ok Edwards, PA-C    Family History Family History  Problem Relation Age of Onset  . Depression Mother   . Hypertension Mother   . Depression Father        Hx: PTSD  . Diabetes Maternal Aunt   . AVM Maternal Grandmother   . Aneurysm Maternal Grandmother   . Seizures Maternal Grandmother     Social History Social History   Tobacco Use  . Smoking status: Current Every Day Smoker    Types: Cigars  . Smokeless tobacco: Never Used  Substance Use Topics  . Alcohol use: Yes    Comment: occasionally  . Drug use: No    Types: Marijuana    Comment: Hx trying THC and smoking marijuana per record     Allergies   Amoxicillin and Penicillins   Review of Systems Review of Systems  Reason unable to perform ROS: See HPI as above.     Physical Exam Triage Vital Signs ED Triage Vitals [09/09/17 1442]  Enc Vitals Group     BP 120/90     Pulse Rate 96     Resp 16     Temp 98.3 F (36.8 C)     Temp src      SpO2 100 %     Weight      Height      Head Circumference  Peak Flow      Pain Score 4     Pain Loc      Pain Edu?      Excl. in [place]?    No data found.  Updated Vital Signs BP 120/90   Pulse 96   Temp 98.3 F (36.8 C)   Resp 16   SpO2 100%   Physical Exam  Constitutional: He is oriented to person, place, and time. He appears well-developed and well-nourished.  Non-toxic appearance. He does not appear ill. No distress.  HENT:  Head: Normocephalic and atraumatic.  Right Ear: External ear and ear canal normal. Tympanic membrane is not erythematous and not bulging.  Left Ear: External ear and ear canal normal. Tympanic membrane is not erythematous and not bulging.  Nose: Nose normal. Right sinus exhibits no maxillary sinus tenderness and no frontal sinus tenderness. Left sinus exhibits no maxillary sinus tenderness and no frontal sinus tenderness.  Mouth/Throat: Uvula is midline, oropharynx is clear and  moist and mucous membranes are normal. No trismus in the jaw. No uvula swelling. Tonsils are 2+ on the right. Tonsils are 2+ on the left. No tonsillar exudate.  Bilateral TM opaque.   Eyes: Pupils are equal, round, and reactive to light. Conjunctivae are normal.  Neck: Normal range of motion. Neck supple.  Cardiovascular: Normal rate, regular rhythm and normal heart sounds. Exam reveals no gallop and no friction rub.  No murmur heard. Pulmonary/Chest: Effort normal and breath sounds normal. He has no decreased breath sounds. He has no wheezes. He has no rhonchi. He has no rales.  Lymphadenopathy:       Head (right side): Submandibular adenopathy present.       Head (left side): Submandibular adenopathy present.  Neurological: He is alert and oriented to person, place, and time.  Skin: Skin is warm and dry.  Psychiatric: He has a normal mood and affect. His behavior is normal. Judgment normal.     UC Treatments / Results  Labs (all labs ordered are listed, but only abnormal results are displayed) Labs Reviewed - No data to display  EKG None  Radiology No results found.  Procedures Procedures (including critical care time)  Medications Ordered in UC Medications - No data to display  Initial Impression / Assessment and Plan / UC Course  I have reviewed the triage vital signs and the nursing notes.  Pertinent labs & imaging results that were available during my care of the patient were reviewed by me and considered in my medical decision making (see chart for details).    Patient nontoxic appearance. Given 1 month history, will cover for tonsillitis with azithromycin. Other symptomatic treatment discussed. Push fluids. Return precautions given. Patient expresses understanding and agrees to plan.  Final Clinical Impressions(s) / UC Diagnoses   Final diagnoses:  Pharyngitis, unspecified etiology    ED Prescriptions    Medication Sig Dispense Auth. Provider   azithromycin  (ZITHROMAX) 250 MG tablet Take 1 tablet (250 mg total) by mouth daily. Take first 2 tablets together, then 1 every day until finished. 6 tablet Jomari Bartnik V, PA-C   ipratropium (ATROVENT) 0.06 % nasal spray Place 2 sprays into both nostrils 4 (four) times daily. 15 mL Tobin Chad, [place] 09/09/17 1510

## 2017-09-09 NOTE — Discharge Instructions (Signed)
Azithromycin as directed. Start atrovent nasal spray nasal congestion/drainage. You can use over the counter nasal saline rinse such as neti pot for nasal congestion. Keep hydrated, your urine should be clear to pale yellow in color. Tylenol/motrin for fever and pain. Monitor for any worsening of symptoms, chest pain, shortness of breath, wheezing, swelling of the throat, follow up for reevaluation.   For sore throat try using a honey-based tea. Use 3 teaspoons of honey with juice squeezed from half lemon. Place shaved pieces of ginger into 1/2-1 cup of water and warm over stove top. Then mix the ingredients and repeat every 4 hours as needed.

## 2018-01-17 ENCOUNTER — Other Ambulatory Visit: Payer: Self-pay

## 2018-01-17 ENCOUNTER — Encounter (HOSPITAL_COMMUNITY): Payer: Self-pay | Admitting: Emergency Medicine

## 2018-01-17 ENCOUNTER — Emergency Department (HOSPITAL_COMMUNITY)
Admission: EM | Admit: 2018-01-17 | Discharge: 2018-01-18 | Discharge status: Against Medical Advice | Payer: 59 | Attending: Emergency Medicine | Admitting: Emergency Medicine

## 2018-01-17 DIAGNOSIS — Z79899 Other long term (current) drug therapy: Secondary | ICD-10-CM | POA: Insufficient documentation

## 2018-01-17 DIAGNOSIS — Z5329 Procedure and treatment not carried out because of patient's decision for other reasons: Secondary | ICD-10-CM | POA: Insufficient documentation

## 2018-01-17 DIAGNOSIS — R112 Nausea with vomiting, unspecified: Secondary | ICD-10-CM | POA: Insufficient documentation

## 2018-01-17 DIAGNOSIS — R1031 Right lower quadrant pain: Secondary | ICD-10-CM | POA: Insufficient documentation

## 2018-01-17 DIAGNOSIS — F17290 Nicotine dependence, other tobacco product, uncomplicated: Secondary | ICD-10-CM | POA: Diagnosis not present

## 2018-01-17 LAB — URINALYSIS, ROUTINE W REFLEX MICROSCOPIC
Bilirubin Urine: NEGATIVE
Glucose, UA: NEGATIVE mg/dL
Hgb urine dipstick: NEGATIVE
KETONES UR: 20 mg/dL — AB
Leukocytes, UA: NEGATIVE
Nitrite: NEGATIVE
PH: 9 — AB (ref 5.0–8.0)
Protein, ur: 100 mg/dL — AB
Specific Gravity, Urine: 1.026 (ref 1.005–1.030)

## 2018-01-17 NOTE — ED Triage Notes (Signed)
Pt c/o lower abdominal pain and vomiting that started yesterday. States it has been taking him longer to initiate urine stream, also reports that his last BM was yesterday morning.

## 2018-01-18 ENCOUNTER — Emergency Department (HOSPITAL_COMMUNITY): Payer: 59

## 2018-01-18 ENCOUNTER — Emergency Department (HOSPITAL_COMMUNITY)
Admission: EM | Admit: 2018-01-18 | Discharge: 2018-01-18 | Disposition: A | Discharge status: Home / Self Care | Payer: 59 | Attending: Emergency Medicine | Admitting: Emergency Medicine

## 2018-01-18 ENCOUNTER — Encounter (HOSPITAL_COMMUNITY): Payer: Self-pay | Admitting: Emergency Medicine

## 2018-01-18 DIAGNOSIS — Z79899 Other long term (current) drug therapy: Secondary | ICD-10-CM | POA: Insufficient documentation

## 2018-01-18 DIAGNOSIS — R112 Nausea with vomiting, unspecified: Secondary | ICD-10-CM | POA: Insufficient documentation

## 2018-01-18 DIAGNOSIS — R1084 Generalized abdominal pain: Secondary | ICD-10-CM | POA: Insufficient documentation

## 2018-01-18 DIAGNOSIS — F17210 Nicotine dependence, cigarettes, uncomplicated: Secondary | ICD-10-CM | POA: Diagnosis not present

## 2018-01-18 LAB — COMPREHENSIVE METABOLIC PANEL
ALT: 12 U/L (ref 0–44)
AST: 23 U/L (ref 15–41)
Albumin: 4.5 g/dL (ref 3.5–5.0)
Alkaline Phosphatase: 54 U/L (ref 38–126)
Anion gap: 11 (ref 5–15)
BUN: 6 mg/dL (ref 6–20)
CHLORIDE: 101 mmol/L (ref 98–111)
CO2: 27 mmol/L (ref 22–32)
Calcium: 9.5 mg/dL (ref 8.9–10.3)
Creatinine, Ser: 0.92 mg/dL (ref 0.61–1.24)
Glucose, Bld: 128 mg/dL — ABNORMAL HIGH (ref 70–99)
POTASSIUM: 3.2 mmol/L — AB (ref 3.5–5.1)
Sodium: 139 mmol/L (ref 135–145)
Total Bilirubin: 1.5 mg/dL — ABNORMAL HIGH (ref 0.3–1.2)
Total Protein: 7.2 g/dL (ref 6.5–8.1)

## 2018-01-18 LAB — LIPASE, BLOOD: LIPASE: 25 U/L (ref 11–51)

## 2018-01-18 LAB — CBC
HCT: 44.1 % (ref 39.0–52.0)
HEMOGLOBIN: 14.4 g/dL (ref 13.0–17.0)
MCH: 29.1 pg (ref 26.0–34.0)
MCHC: 32.7 g/dL (ref 30.0–36.0)
MCV: 89.3 fL (ref 78.0–100.0)
Platelets: 229 10*3/uL (ref 150–400)
RBC: 4.94 MIL/uL (ref 4.22–5.81)
RDW: 13.2 % (ref 11.5–15.5)
WBC: 23.8 10*3/uL — ABNORMAL HIGH (ref 4.0–10.5)

## 2018-01-18 MED ORDER — ONDANSETRON 4 MG PO TBDP: 4.0000 mg | ORAL_TABLET | Freq: Three times a day (TID) | ORAL | 0 refills | Status: DC | PRN

## 2018-01-18 MED ORDER — IOPAMIDOL (ISOVUE-300) INJECTION 61%
100.0000 mL | Freq: Once | INTRAVENOUS | Status: AC | PRN
  Administered 2018-01-18: 100 mL via INTRAVENOUS

## 2018-01-18 MED ORDER — MORPHINE SULFATE (PF) 4 MG/ML IV SOLN
4.0000 mg | Freq: Once | INTRAVENOUS | Status: DC
  Filled 2018-01-18: qty 1

## 2018-01-18 MED ORDER — ONDANSETRON HCL 4 MG/2ML IJ SOLN
4.0000 mg | Freq: Once | INTRAMUSCULAR | Status: DC
  Filled 2018-01-18: qty 2

## 2018-01-18 MED ORDER — SODIUM CHLORIDE 0.9 % IV BOLUS
500.0000 mL | Freq: Once | INTRAVENOUS | Status: AC
  Administered 2018-01-18: 500 mL via INTRAVENOUS

## 2018-01-18 MED ORDER — DICYCLOMINE HCL 20 MG PO TABS: 20.0000 mg | ORAL_TABLET | Freq: Two times a day (BID) | ORAL | 0 refills | Status: DC | PRN

## 2018-01-18 MED ORDER — MORPHINE SULFATE (PF) 4 MG/ML IV SOLN
4.0000 mg | Freq: Once | INTRAVENOUS | Status: AC
  Administered 2018-01-18: 4 mg via INTRAVENOUS
  Filled 2018-01-18: qty 1

## 2018-01-18 MED ORDER — IOPAMIDOL (ISOVUE-300) INJECTION 61%
INTRAVENOUS | Status: AC
  Filled 2018-01-18: qty 100

## 2018-01-18 NOTE — ED Notes (Signed)
Pt family member is complaining of time. States that they have been here since 10:30. I explained that we had a critical patient we were taking care of. She said that she knows because she was in the hallway and could see that something was going on. Patient then states that she is going to start recording everything and brought out her phone. I then went to charge and got security. Patient then said he was going to leave AMA if she left. She said she was still turning video in to the news.

## 2018-01-18 NOTE — ED Notes (Signed)
Pt mother started to yell at staff about not treating her son yet. Pt mother advised staff was in another critical pt room who was needing life saving measures. Pt mother then got her cell phone out and started to record this nurse and other staff stating that we are not doing anything. Agricultural consultant and security notified of situation. Pt was educated on the need to stay for his scan and medications. Pt then got up and left AMA. MD Ward notified.

## 2018-01-18 NOTE — ED Notes (Signed)
Patient is tolerating oral fluids well, reports no N/V.

## 2018-01-18 NOTE — Discharge Instructions (Signed)
It was my pleasure taking care of you today!   Zofran as needed for nausea. Bentyl as needed for abdominal pain.   At this time, there does not appear to be an acute, emergent cause for your abdominal pain. However, this does not mean that your abdominal pain may not become an emergency in the future. It is VERY important that you monitor your symptoms and return to the Emergency Department if you develop any of the following symptoms:  You keep throwing up and can't keep fluids down.  You pass bloody or black tarry stools.  There is bright red blood in the stool. You do not seem to be getting better.  You have any questions or concerns.

## 2018-01-18 NOTE — ED Notes (Signed)
Assisted patient with ambulation to bathroom.

## 2018-01-18 NOTE — ED Provider Notes (Signed)
[facility] EMERGENCY DEPARTMENT Provider Note   CSN: [id number] Arrival date & time: 01/17/18  2245     History   Chief Complaint Chief Complaint  Patient presents with  . Abdominal Pain  . Emesis    HPI Devon Hernandez is a 22 y.o. male.  Patient presents to the emergency department with a chief complaint of abdominal pain.  He reports right lower and generalized lower abdominal pain which is been worsening since yesterday.  He reports associated nausea and vomiting.  He denies any dysuria or hematuria, but does report some urinary hesitancy.  States last bowel movement was yesterday.  He denies any fevers or chills.  He has not taken anything for his symptoms.  He denies any other associated symptoms.  The history is provided by the patient. No language interpreter was used.    Past Medical History:  Diagnosis Date  . ADHD (attention deficit hyperactivity disorder)   . Allergy   . Anxiety   . Bipolar disorder ([place])   . Depression   . VOJJKKXF(818.2)     Patient Active Problem List   Diagnosis Date Noted  . 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 Perianal wart 03/30/2017  . Polysubstance abuse ([place]) 07/11/2013  . Altered mental status 07/09/2013  . MDD (major depressive disorder), recurrent episode, severe ([place]) 01/18/2013  . ADHD (attention deficit hyperactivity disorder), combined type 01/18/2013  . Conduct disorder, 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    Past Surgical History:  Procedure Laterality Date  . TYMPANOSTOMY TUBE PLACEMENT          Home Medications    Prior to Admission medications   Medication Sig Start Date End Date Taking? Authorizing Provider  azithromycin (ZITHROMAX) 250 MG tablet Take 1 tablet (250 mg total) by mouth daily. Take first 2 tablets together, then 1 every day until finished. 09/09/17   Tasia Catchings, Amy V, PA-C  ipratropium (ATROVENT) 0.06 % nasal spray Place 2 sprays into both nostrils 4 (four) times  daily. 09/09/17   Tasia Catchings, Amy V, PA-C  LamoTRIgine (LAMICTAL PO) Take by mouth.    [provider]  LITHIUM PO Take by mouth.    [provider]  Mirtazapine (REMERON PO) Take by mouth.    [provider]    Family History Family History  Problem Relation Age of Onset  . Depression Mother   . Hypertension Mother   . Depression Father        Hx: PTSD  . Diabetes Maternal Aunt   . AVM Maternal Grandmother   . Aneurysm Maternal Grandmother   . Seizures Maternal Grandmother     Social History Social History   Tobacco Use  . Smoking status: Current Every Day Smoker    Types: Cigars  . Smokeless tobacco: Never Used  Substance Use Topics  . Alcohol use: Yes    Comment: occasionally  . Drug use: No    Types: Marijuana    Comment: Hx trying THC and smoking marijuana per record     Allergies   Amoxicillin and Penicillins   Review of Systems Review of Systems  All other systems reviewed and are negative.    Physical Exam Updated Vital Signs BP 120/74 (BP Location: Right Arm)   Pulse 66   Temp 98.9 F (37.2 C) (Oral)   Resp 16   SpO2 98%   Physical Exam  Constitutional: He is oriented to person, place, and time. He appears well-developed and well-nourished.  HENT:  Head: Normocephalic and atraumatic.  Eyes: Pupils are equal, round, and reactive to light. Conjunctivae and EOM are normal. Right eye exhibits no discharge. Left eye exhibits no discharge. No scleral icterus.  Neck: Normal range of motion. Neck supple. No JVD present.  Cardiovascular: Normal rate, regular rhythm and normal heart sounds. Exam reveals no gallop and no friction rub.  No murmur heard. Pulmonary/Chest: Effort normal and breath sounds normal. No respiratory distress. He has no wheezes. He has no rales. He exhibits no tenderness.  Abdominal: Soft. He exhibits no distension and no mass. There is tenderness in the right lower quadrant. There is no rebound and no guarding.    Musculoskeletal: Normal range of motion. He exhibits no edema or tenderness.  Neurological: He is alert and oriented to person, place, and time.  Skin: Skin is warm and dry.  Psychiatric: He has a normal mood and affect. His behavior is normal. Judgment and thought content normal.  Nursing note and vitals reviewed.    ED Treatments / Results  Labs (all labs ordered are listed, but only abnormal results are displayed) Labs Reviewed  COMPREHENSIVE METABOLIC PANEL - Abnormal; Notable for the following components:      Result Value   Potassium 3.2 (*)    Glucose, Bld 128 (*)    Total Bilirubin 1.5 (*)    All other components within normal limits  CBC - Abnormal; Notable for the following components:   WBC 23.8 (*)    All other components within normal limits  URINALYSIS, ROUTINE W REFLEX MICROSCOPIC - Abnormal; Notable for the following components:   APPearance CLOUDY (*)    pH 9.0 (*)    Ketones, ur 20 (*)    Protein, ur 100 (*)    Bacteria, UA RARE (*)    All other components within normal limits  LIPASE, BLOOD    EKG None  Radiology No results found.  Procedures Procedures (including critical care time)  Medications Ordered in ED Medications  morphine 4 MG/ML injection 4 mg (has no administration in time range)  ondansetron (ZOFRAN) injection 4 mg (has no administration in time range)     Initial Impression / Assessment and Plan / ED Course  I have reviewed the triage vital signs and the nursing notes.  Pertinent labs & imaging results that were available during my care of the patient were reviewed by me and considered in my medical decision making (see chart for details).     Patient with right lower quadrant pain and generalized lower abdominal pain since yesterday.  Pain is been worsening.  Significant leukocytosis to 23.8.  Will check CT to evaluate for appendicitis.  Delay in care due to code in adjacent room which required all resources.  Patient and  family aware.  3:18 AM Notified by RN that patient left AMA after being encouraged to leave by mother.  Informed that the mother started filming other patients and staff. I was informed after the fact and was not present for any the events that transpired. Mother and patient were aware that we were getting a CT to rule out appendicitis.   Final Clinical Impressions(s) / ED Diagnoses   Final diagnoses:  Right lower quadrant abdominal pain  Nausea and vomiting, intractability of vomiting not specified, unspecified vomiting type    ED Discharge Orders    None       Montine Circle, PA-C 01/18/18 [place], Delice Bison, DO 01/18/18 [phone]

## 2018-01-18 NOTE — ED Provider Notes (Signed)
[facility] DEPT Provider Note   CSN: [id number] Arrival date & time: 01/18/18  0316     History   Chief Complaint Chief Complaint  Patient presents with  . Abdominal Pain    HPI Devon Hernandez is a 22 y.o. male.  The history is provided by the patient and medical records. No language interpreter was used.   Devon Hernandez is a 22 y.o. male  with a PMH as listed below who presents to the Emergency Department complaining of generalized abdominal pain which began yesterday.  Associated with nausea and greater than 10 episodes of vomiting.  No bowel movement since yesterday.  No fever or chills.  No medications taken prior to arrival for symptoms.  No alleviating or aggravating factors noted.  Patient went to [facility] earlier, but left due to such a long wait.  He did get labs drawn at that time.   Past Medical History:  Diagnosis Date  . ADHD (attention deficit hyperactivity disorder)   . Allergy   . Anxiety   . Bipolar disorder ([place])   . Depression   . BOFBPZWC(585.2)     Patient Active Problem List   Diagnosis Date Noted  . 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 Perianal wart 03/30/2017  . Polysubstance abuse ([place]) 07/11/2013  . Altered mental status 07/09/2013  . MDD (major depressive disorder), recurrent episode, severe ([place]) 01/18/2013  . ADHD (attention deficit hyperactivity disorder), combined type 01/18/2013  . Conduct disorder, 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    Past Surgical History:  Procedure Laterality Date  . TYMPANOSTOMY TUBE PLACEMENT          Home Medications    Prior to Admission medications   Medication Sig Start Date End Date Taking? Authorizing Provider  azithromycin (ZITHROMAX) 250 MG tablet Take 1 tablet (250 mg total) by mouth daily. Take first 2 tablets together, then 1 every day until finished. Patient not taking: Reported on 01/18/2018 09/09/17   Ok Edwards, PA-C  dicyclomine (BENTYL) 20  MG tablet Take 1 tablet (20 mg total) by mouth 2 (two) times daily as needed for spasms. 01/18/18   Ward, Ozella Almond, PA-C  ipratropium (ATROVENT) 0.06 % nasal spray Place 2 sprays into both nostrils 4 (four) times daily. Patient not taking: Reported on 01/18/2018 09/09/17   Ok Edwards, PA-C  lamoTRIgine (LAMICTAL) 150 MG tablet Take 150 mg by mouth 2 (two) times daily. 11/27/17   [provider]  lithium carbonate 300 MG capsule Take 300 mg by mouth 2 (two) times daily. 11/01/17   [provider]  mirtazapine (REMERON) 15 MG tablet Take 15 mg by mouth at bedtime.    [provider]  ondansetron (ZOFRAN ODT) 4 MG disintegrating tablet Take 1 tablet (4 mg total) by mouth every 8 (eight) hours as needed for nausea or vomiting. 01/18/18   Ward, Ozella Almond, PA-C    Family History Family History  Problem Relation Age of Onset  . Depression Mother   . Hypertension Mother   . Depression Father        Hx: PTSD  . Diabetes Maternal Aunt   . AVM Maternal Grandmother   . Aneurysm Maternal Grandmother   . Seizures Maternal Grandmother     Social History Social History   Tobacco Use  . Smoking status: Current Every Day Smoker    Types: Cigars  . Smokeless tobacco: Never Used  Substance Use Topics  . Alcohol use: Yes  Comment: occasionally  . Drug use: No    Types: Marijuana    Comment: Hx trying THC and smoking marijuana per record     Allergies   Amoxicillin and Penicillins   Review of Systems Review of Systems  Gastrointestinal: Positive for abdominal pain, nausea and vomiting. Negative for constipation and diarrhea.  All other systems reviewed and are negative.    Physical Exam Updated Vital Signs BP 117/73   Pulse 76   Temp 99.5 F (37.5 C) (Oral)   Resp 16   SpO2 100%   Physical Exam  Constitutional: He is oriented to person, place, and time. He appears well-developed and well-nourished. No distress.  HENT:  Head: Normocephalic and  atraumatic.  Cardiovascular: Normal rate, regular rhythm and normal heart sounds.  No murmur heard. Pulmonary/Chest: Effort normal and breath sounds normal. No respiratory distress.  Abdominal: Soft. Bowel sounds are normal. He exhibits no distension.  Generalized abdominal tenderness, most significantly in the right lower quadrant.  Musculoskeletal: Normal range of motion.  Neurological: He is alert and oriented to person, place, and time.  Skin: Skin is warm and dry.  Nursing note and vitals reviewed.    ED Treatments / Results  Labs (all labs ordered are listed, but only abnormal results are displayed) Labs Reviewed - No data to display  EKG None  Radiology Ct Abdomen Pelvis W Contrast  Result Date: 01/18/2018 CLINICAL DATA:  Abdominal pain, nausea, and vomiting. EXAM: CT ABDOMEN AND PELVIS WITH CONTRAST TECHNIQUE: Multidetector CT imaging of the abdomen and pelvis was performed using the standard protocol following bolus administration of intravenous contrast. CONTRAST:  131mL ISOVUE-300 IOPAMIDOL (ISOVUE-300) INJECTION 61% COMPARISON:  None. FINDINGS: Lower chest: Lung bases are clear. Hepatobiliary: No focal liver abnormality is seen. No gallstones, gallbladder wall thickening, or biliary dilatation. Pancreas: Unremarkable. No pancreatic ductal dilatation or surrounding inflammatory changes. Spleen: Normal in size without focal abnormality. Adrenals/Urinary Tract: Adrenal glands are unremarkable. Kidneys are normal, without renal calculi, focal lesion, or hydronephrosis. Bladder is unremarkable. Stomach/Bowel: Stomach, small bowel, and colon are not abnormally distended. Scattered stool throughout the colon. No wall thickening or inflammatory changes are appreciated. Appendix is not specifically identified. Vascular/Lymphatic: No significant vascular findings are present. No enlarged abdominal or pelvic lymph nodes. Reproductive: Prostate is unremarkable. Other: There is a tiny amount  of free fluid in the low pelvis. This could be reactive and can be normal in male patients. No free air in the abdomen. Abdominal wall musculature appears intact. Musculoskeletal: No acute or significant osseous findings. IMPRESSION: No acute process demonstrated in the abdomen or pelvis. No evidence of bowel obstruction or inflammation. Appendix is not specifically identified but no inflammatory changes are appreciated in the right lower quadrant. There is a tiny amount of free fluid in the pelvis which could be reactive or can be normal in male patients. No free air is identified. Electronically Signed   By: Lucienne Capers M.D.   On: 01/18/2018 06:43    Procedures Procedures (including critical care time)  Medications Ordered in ED Medications  iopamidol (ISOVUE-300) 61 % injection (has no administration in time range)  sodium chloride 0.9 % bolus 500 mL (has no administration in time range)  morphine 4 MG/ML injection 4 mg (4 mg Intravenous Given 01/18/18 0539)  iopamidol (ISOVUE-300) 61 % injection 100 mL (100 mLs Intravenous Contrast Given 01/18/18 9233)     Initial Impression / Assessment and Plan / ED Course  I have reviewed the triage vital signs and  the nursing notes.  Pertinent labs & imaging results that were available during my care of the patient were reviewed by me and considered in my medical decision making (see chart for details).    Devon Hernandez is a 22 y.o. male who presents to ED for abdominal pain, nausea and vomiting.  Was seen at [facility] earlier today where labs were obtained.  These were reviewed and notable for leukocytosis of 23.8.  Given his elevated white count and tenderness to the right lower quadrant on exam, CT scan was obtained today.  CT reviewed with attending, Dr. Stark Jock, with no acute findings.  On reevaluation, patient is nontoxic-appearing and tolerating PO. Evaluation does not show pathology that would require ongoing emergent intervention or inpatient  treatment.  Strict return precautions discussed with patient and mother at bedside.  Will treat symptomatically and have patient follow-up with his primary care doctor.  All questions answered.  Final Clinical Impressions(s) / ED Diagnoses   Final diagnoses:  Generalized abdominal pain    ED Discharge Orders         Ordered    ondansetron (ZOFRAN ODT) 4 MG disintegrating tablet  Every 8 hours PRN     01/18/18 0708    dicyclomine (BENTYL) 20 MG tablet  2 times daily PRN     01/18/18 0708           Ward, Ozella Almond, PA-C 01/18/18 8657    Veryl Speak, MD 01/18/18 2300

## 2018-01-18 NOTE — ED Triage Notes (Signed)
Pt BIB mother from [facility]. Patient and mother state that labs were drawn and at midnight were told that they were going to do a CT and give patient medication but at 0300 nothing had been done so they left. Patient pale on arrival. Patient has been having lower abdominal/ pelvic pain since yesterday morning, unable to tolerate PO. Per [place] note, patient also with difficulty initiating urine stream. Last bowel movement yesterday (9/18)  morning

## 2018-03-15 ENCOUNTER — Other Ambulatory Visit: Payer: Self-pay

## 2018-03-15 ENCOUNTER — Ambulatory Visit (HOSPITAL_COMMUNITY)
Admission: EM | Admit: 2018-03-15 | Discharge: 2018-03-15 | Disposition: A | Discharge status: Home / Self Care | Payer: 59 | Attending: Family Medicine | Admitting: Family Medicine

## 2018-03-15 ENCOUNTER — Encounter (HOSPITAL_COMMUNITY): Payer: Self-pay

## 2018-03-15 DIAGNOSIS — K529 Noninfective gastroenteritis and colitis, unspecified: Secondary | ICD-10-CM

## 2018-03-15 DIAGNOSIS — R1084 Generalized abdominal pain: Secondary | ICD-10-CM

## 2018-03-15 MED ORDER — ONDANSETRON 4 MG PO TBDP: 4.0000 mg | ORAL_TABLET | Freq: Three times a day (TID) | ORAL | 0 refills | Status: DC | PRN

## 2018-03-15 NOTE — Discharge Instructions (Addendum)

## 2018-03-15 NOTE — ED Triage Notes (Signed)
Pt cc stomach and back pain started on Monday and by Tuesday the vomiting and diarrhea.

## 2018-03-15 NOTE — ED Provider Notes (Signed)
[place]   [id number] 03/15/18 Arrival Time: [place]:  1. Generalized abdominal pain   2. Gastroenteritis   Expect gradual resolution over the next 24-48 hours. No IVF at this time as he seems to be tolerating PO fluids much better. No signs of significant dehydration. Discussed.  Meds ordered this encounter  Medications  . ondansetron (ZOFRAN-ODT) 4 MG disintegrating tablet    Sig: Take 1 tablet (4 mg total) by mouth every 8 (eight) hours as needed for nausea or vomiting.    Dispense:  15 tablet    Refill:  0   Work note given. Discussed typical duration of symptoms for suspected viral GI illness. Will do his best to ensure adequate fluid intake in order to avoid dehydration. Will proceed to the Emergency Department for evaluation if unable to tolerate PO fluids regularly.  Follow-up Information    Robyne Peers, MD.   Specialty:  Family Medicine Why:  As needed. Contact information: [address] [place] [address] [phone]        [facility] EMERGENCY DEPARTMENT.   Specialty:  Emergency Medicine Why:  If symptoms worsen. Contact information: [id number] [place] [phone]         Reviewed expectations re: course of current medical issues. Questions answered. Outlined signs and symptoms indicating need for more acute intervention. Patient verbalized understanding. After Visit Summary given.   SUBJECTIVE: History from: patient.  NATHANEAL SOMMERS is a 22 y.o. male who presents with complaint of non-bloody intermittent nausea and vomiting of brown material with diarrhea. Onset approx 48-72 hours ago. Generalized bdominal discomfort: mild to moderate and cramping; stable. Symptoms are gradually improving since beginning. Mainly continued diarrhea/loose stools bothering him the most. No emesis over the past 24 hours. Very slight and transient  nausea. Aggravating factors: eating; "goes right through me". Alleviating factors: none. Associated symptoms: fatigue. He denies chills and fever. Appetite: decreased. PO intake: overall decreased; tolerating PO fluids better today. Ambulatory without assistance. Urinary symptoms: none. Last bowel movement this morning without blood. Sick contacts: none. Recent travel or camping: none. OTC treatment: none.  Past Surgical History:  Procedure Laterality Date  . TYMPANOSTOMY TUBE PLACEMENT     ROS: As per HPI. All other systems negative.   OBJECTIVE:  Vitals:   03/15/18 1016 03/15/18 1018  BP:  102/68  Pulse:  89  Resp:  18  Temp:  99.1 F (37.3 C)  TempSrc:  Oral  SpO2:  100%  Weight: 56.2 kg     General appearance: alert; no distress Oropharynx: is moist; no erythema Lungs: clear to auscultation bilaterally; unlabored Heart: regular rate and rhythm without murmer Abdomen: soft; non-distended; mild and vague generalized abdominal tenderness with palpation; reports "cramping" feeling; bowel sounds present; no masses or organomegaly; no guarding or rebound tenderness Back: no CVA tenderness Extremities: no edema; symmetrical with no gross deformities Skin: warm; dry Neurologic: normal gait without difficulty Psychological: alert and cooperative; normal mood and affect   Allergies  Allergen Reactions  . Amoxicillin Hives and Diarrhea  . Penicillins     Per Dad                                               Past Medical History:  Diagnosis Date  . ADHD (attention  deficit hyperactivity disorder)   . Allergy   . Anxiety   . Bipolar disorder ([place])   . Depression   . Headache(784.0)    Social History   Socioeconomic History  . Marital status: Single    Spouse name: Not on file  . Number of children: Not on file  . Years of education: Not on file  . Highest education level: Not on file  Occupational History  . Not on file  Social Needs  . Financial resource strain:  Not on file  . Food insecurity:    Worry: Not on file    Inability: Not on file  . Transportation needs:    Medical: Not on file    Non-medical: Not on file  Tobacco Use  . Smoking status: Current Every Day Smoker    Types: Cigars  . Smokeless tobacco: Never Used  Substance and Sexual Activity  . Alcohol use: Yes    Comment: occasionally  . Drug use: No    Types: Marijuana    Comment: Hx trying THC and smoking marijuana per record  . Sexual activity: Never  Lifestyle  . Physical activity:    Days per week: Not on file    Minutes per session: Not on file  . Stress: Not on file  Relationships  . Social connections:    Talks on phone: Not on file    Gets together: Not on file    Attends religious service: Not on file    Active member of club or organization: Not on file    Attends meetings of clubs or organizations: Not on file    Relationship status: Not on file  . Intimate partner violence:    Fear of current or ex partner: Not on file    Emotionally abused: Not on file    Physically abused: Not on file    Forced sexual activity: Not on file  Other Topics Concern  . Not on file  Social History Narrative   Mom currently has full custody but Khaled has gone to live with his dad. Tonight was the first night that he was at dad's house. He admitted to marijuana use tonight, unclear when. Smoke exposure at home. No pets at home.   Family History  Problem Relation Age of Onset  . Depression Mother   . Hypertension Mother   . Depression Father        Hx: PTSD  . Diabetes Maternal Aunt   . AVM Maternal Grandmother   . Aneurysm Maternal Grandmother   . Seizures Maternal Izetta Dakin, MD 03/15/18 1041

## 2018-06-19 ENCOUNTER — Other Ambulatory Visit: Payer: 59

## 2018-06-19 ENCOUNTER — Ambulatory Visit: Payer: 59

## 2018-06-19 ENCOUNTER — Other Ambulatory Visit: Payer: Self-pay

## 2018-06-19 DIAGNOSIS — B20 Human immunodeficiency virus [HIV] disease: Secondary | ICD-10-CM

## 2018-06-19 DIAGNOSIS — Z79899 Other long term (current) drug therapy: Secondary | ICD-10-CM

## 2018-06-19 DIAGNOSIS — Z113 Encounter for screening for infections with a predominantly sexual mode of transmission: Secondary | ICD-10-CM

## 2018-07-05 ENCOUNTER — Emergency Department (HOSPITAL_COMMUNITY)
Admission: EM | Admit: 2018-07-05 | Discharge: 2018-07-05 | Disposition: A | Discharge status: Home / Self Care | Payer: 59 | Attending: Emergency Medicine | Admitting: Emergency Medicine

## 2018-07-05 ENCOUNTER — Other Ambulatory Visit: Payer: Self-pay

## 2018-07-05 ENCOUNTER — Telehealth: Payer: Self-pay | Admitting: *Deleted

## 2018-07-05 ENCOUNTER — Emergency Department (HOSPITAL_COMMUNITY): Payer: 59

## 2018-07-05 ENCOUNTER — Ambulatory Visit: Payer: 59

## 2018-07-05 ENCOUNTER — Other Ambulatory Visit: Payer: 59

## 2018-07-05 DIAGNOSIS — K5900 Constipation, unspecified: Secondary | ICD-10-CM | POA: Insufficient documentation

## 2018-07-05 DIAGNOSIS — K6289 Other specified diseases of anus and rectum: Secondary | ICD-10-CM | POA: Insufficient documentation

## 2018-07-05 DIAGNOSIS — B20 Human immunodeficiency virus [HIV] disease: Secondary | ICD-10-CM

## 2018-07-05 DIAGNOSIS — Z79899 Other long term (current) drug therapy: Secondary | ICD-10-CM | POA: Insufficient documentation

## 2018-07-05 DIAGNOSIS — F17290 Nicotine dependence, other tobacco product, uncomplicated: Secondary | ICD-10-CM | POA: Insufficient documentation

## 2018-07-05 DIAGNOSIS — Z21 Asymptomatic human immunodeficiency virus [HIV] infection status: Secondary | ICD-10-CM

## 2018-07-05 DIAGNOSIS — Z113 Encounter for screening for infections with a predominantly sexual mode of transmission: Secondary | ICD-10-CM

## 2018-07-05 HISTORY — DX: Human immunodeficiency virus (HIV) disease: B20

## 2018-07-05 HISTORY — DX: Asymptomatic human immunodeficiency virus (hiv) infection status: Z21

## 2018-07-05 LAB — CBC WITH DIFFERENTIAL/PLATELET
Abs Immature Granulocytes: 0.04 10*3/uL (ref 0.00–0.07)
BASOS PCT: 1 %
Basophils Absolute: 0.1 10*3/uL (ref 0.0–0.1)
EOS ABS: 0 10*3/uL (ref 0.0–0.5)
Eosinophils Relative: 0 %
HCT: 44.2 % (ref 39.0–52.0)
Hemoglobin: 13.8 g/dL (ref 13.0–17.0)
Immature Granulocytes: 0 %
Lymphocytes Relative: 24 %
Lymphs Abs: 3.1 10*3/uL (ref 0.7–4.0)
MCH: 28.6 pg (ref 26.0–34.0)
MCHC: 31.2 g/dL (ref 30.0–36.0)
MCV: 91.5 fL (ref 80.0–100.0)
MONO ABS: 1.1 10*3/uL — AB (ref 0.1–1.0)
MONOS PCT: 8 %
NEUTROS PCT: 67 %
Neutro Abs: 8.4 10*3/uL — ABNORMAL HIGH (ref 1.7–7.7)
PLATELETS: 205 10*3/uL (ref 150–400)
RBC: 4.83 MIL/uL (ref 4.22–5.81)
RDW: 13.5 % (ref 11.5–15.5)
WBC: 12.7 10*3/uL — ABNORMAL HIGH (ref 4.0–10.5)
nRBC: 0 % (ref 0.0–0.2)

## 2018-07-05 LAB — BASIC METABOLIC PANEL
Anion gap: 8 (ref 5–15)
BUN: 10 mg/dL (ref 6–20)
CALCIUM: 9.2 mg/dL (ref 8.9–10.3)
CO2: 26 mmol/L (ref 22–32)
Chloride: 103 mmol/L (ref 98–111)
Creatinine, Ser: 0.85 mg/dL (ref 0.61–1.24)
GFR calc Af Amer: >60 mL/min (ref >60)
GLUCOSE: 91 mg/dL (ref 70–99)
Potassium: 3.7 mmol/L (ref 3.5–5.1)
Sodium: 137 mmol/L (ref 135–145)

## 2018-07-05 MED ORDER — LEVOFLOXACIN 750 MG PO TABS: 750.0000 mg | ORAL_TABLET | Freq: Every day | ORAL | 0 refills | Status: AC

## 2018-07-05 MED ORDER — HYDROCORTISONE 2.5 % RE CREA
TOPICAL_CREAM | Freq: Once | RECTAL | Status: AC
  Administered 2018-07-05: 1 via RECTAL
  Filled 2018-07-05: qty 28.35

## 2018-07-05 MED ORDER — SODIUM CHLORIDE (PF) 0.9 % IJ SOLN
INTRAMUSCULAR | Status: AC
  Administered 2018-07-05: 21:00:00
  Filled 2018-07-05: qty 50

## 2018-07-05 MED ORDER — METRONIDAZOLE 500 MG PO TABS: 500.0000 mg | ORAL_TABLET | Freq: Three times a day (TID) | ORAL | 0 refills | Status: AC

## 2018-07-05 MED ORDER — IOPAMIDOL (ISOVUE-300) INJECTION 61%
100.0000 mL | Freq: Once | INTRAVENOUS | Status: AC | PRN
  Administered 2018-07-05: 100 mL via INTRAVENOUS

## 2018-07-05 MED ORDER — IOPAMIDOL (ISOVUE-300) INJECTION 61%
INTRAVENOUS | Status: AC
  Filled 2018-07-05: qty 100

## 2018-07-05 NOTE — Discharge Instructions (Signed)
You have been seen today for rectal pain. Please read and follow all provided instructions. Return to the emergency room for worsening condition or new concerning symptoms.    1. Medications:  -Prescriptions sent to your pharmacy for Flagyl and Levaquin.  Please take as prescribed.  It is very important that you do not drink alcohol while taking Flagyl because of the side effect of extreme nausea and vomiting.  -You can take over-the-counter MiraLAX for constipation.  You should take this daily. Please take as directed.  Continue usual home medications Take medications as prescribed. Please review all of the medicines and only take them if you do not have an allergy to them.   2. Treatment: rest, drink plenty of fluids. You can take Sitz baths to help with pain.  3. Follow Up: You can follow-up outpatient with Dr. Marcello Moores in office for continued rectal pain despite treatment.  Please follow up with your primary doctor in 2-5 days for discussion of your diagnoses and further evaluation after today's visit; Call today to arrange your follow up.    It is also a possibility that you have an allergic reaction to any of the medicines that you have been prescribed - Everybody reacts differently to medications and while MOST people have no trouble with most medicines, you may have a reaction such as nausea, vomiting, rash, swelling, shortness of breath. If this is the case, please stop taking the medicine immediately and contact your physician.   ?

## 2018-07-05 NOTE — ED Triage Notes (Signed)
Started having rectal pain last night; recent bouts of constipation (since Thursday of last week). +bleeding. Rectal pain radiates down left leg. Denies any abdominal pain, nausea/vomiting. Pain 10/10.

## 2018-07-05 NOTE — ED Provider Notes (Signed)
[facility] DEPT Provider Note   CSN: [id number] Arrival date & time: 07/05/18  1650    History   Chief Complaint Chief Complaint  Patient presents with  . Rectal Bleeding    HPI Devon Hernandez is a 23 y.o. male with history of external hemorrhoids, constipation, bipolar disorder, anxiety presenting to emergency department today with chief complaint of rectal pain x1 day.  He describes the pain as sharp and constant.  It radiates down his left leg.  He rates the pain 10 out of 10 in severity. Patient states he first noticed what he thinks to be an external hemorrhoid 4 days ago over a was not painful at that time. He states the pain is worse with movement or touching the area and better when standing.   Patient admits to anal intercourse x1 week ago.  Patient has associated constipation with his last bowel movement being 7 days ago. He admits to passing gas.   He has not tried any over-the-counter medications for his constipation or possible hemorrhoid.  Patient was straining earlier today attempt to have bowel movement and noticed bright red blood on the tissue after wiping. Patient also reports he was getting blood drawn earlier today and had a near syncopal episode.  He denies cardiac history, chest pain, palpitations, shortness of breath. He denies any fever or chills.  History provided by the patient.       Past Medical History:  Diagnosis Date  . ADHD (attention deficit hyperactivity disorder)   . Allergy   . Anxiety   . Bipolar disorder ([place])   . Depression   . POEUMPNT(614.4)     Patient Active Problem List   Diagnosis Date Noted  . 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 Perianal wart 03/30/2017  . Polysubstance abuse ([place]) 07/11/2013  . Altered mental status 07/09/2013  . MDD (major depressive disorder), recurrent episode, severe ([place]) 01/18/2013  . ADHD (attention deficit hyperactivity disorder), combined type  01/18/2013  . Conduct disorder, 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    Past Surgical History:  Procedure Laterality Date  . TYMPANOSTOMY TUBE PLACEMENT          Home Medications    Prior to Admission medications   Medication Sig Start Date End Date Taking? Authorizing Provider  acetaminophen (TYLENOL) 325 MG tablet Take 650 mg by mouth every 6 (six) hours as needed for mild pain or headache.   Yes [provider]  mirtazapine (REMERON) 15 MG tablet Take 15 mg by mouth at bedtime.   Yes [provider]  azithromycin (ZITHROMAX) 250 MG tablet Take 1 tablet (250 mg total) by mouth daily. Take first 2 tablets together, then 1 every day until finished. Patient not taking: Reported on 01/18/2018 09/09/17   Ok Edwards, PA-C  dicyclomine (BENTYL) 20 MG tablet Take 1 tablet (20 mg total) by mouth 2 (two) times daily as needed for spasms. Patient not taking: Reported on 07/05/2018 01/18/18   Ward, Ozella Almond, PA-C  ipratropium (ATROVENT) 0.06 % nasal spray Place 2 sprays into both nostrils 4 (four) times daily. Patient not taking: Reported on 01/18/2018 09/09/17   Ok Edwards, PA-C  levofloxacin (LEVAQUIN) 750 MG tablet Take 1 tablet (750 mg total) by mouth daily for 7 days. 07/05/18 07/12/18  Michal Callicott E, PA-C  metroNIDAZOLE (FLAGYL) 500 MG tablet Take 1 tablet (500 mg total) by mouth 3 (three) times daily for 7 days. 07/05/18 07/12/18  Norah Devin, Harley Hallmark, PA-C  ondansetron (ZOFRAN-ODT) 4 MG disintegrating tablet Take 1 tablet (4 mg total) by mouth every 8 (eight) hours as needed for nausea or vomiting. Patient not taking: Reported on 07/05/2018 03/15/18   Vanessa Kick, MD    Family History Family History  Problem Relation Age of Onset  . Depression Mother   . Hypertension Mother   . Depression Father        Hx: PTSD  . Diabetes Maternal Aunt   . AVM Maternal Grandmother   . Aneurysm Maternal Grandmother   . Seizures Maternal Grandmother     Social History Social History     Tobacco Use  . Smoking status: Current Every Day Smoker    Types: Cigars  . Smokeless tobacco: Never Used  Substance Use Topics  . Alcohol use: Yes    Comment: occasionally  . Drug use: No    Types: Marijuana    Comment: Hx trying THC and smoking marijuana per record     Allergies   Amoxicillin and Penicillins   Review of Systems Review of Systems  Gastrointestinal: Positive for constipation and rectal pain.  All other systems reviewed and are negative.    Physical Exam Updated Vital Signs BP 130/78 (BP Location: Right Arm)   Pulse 79   Temp 99.1 F (37.3 C) (Oral)   Resp 18   Ht 5\' 9"  (1.753 m)   Wt 56.7 kg   SpO2 100%   BMI 18.46 kg/m   Physical Exam Vitals signs and nursing note reviewed.  Constitutional:      Appearance: He is well-developed. He is not toxic-appearing.  HENT:     Head: Normocephalic and atraumatic.  Eyes:     General: No scleral icterus.       Right eye: No discharge.        Left eye: No discharge.     Conjunctiva/sclera: Conjunctivae normal.  Neck:     Musculoskeletal: Normal range of motion.  Cardiovascular:     Rate and Rhythm: Normal rate and regular rhythm.     Pulses: Normal pulses.     Heart sounds: Normal heart sounds.  Pulmonary:     Effort: Pulmonary effort is normal.     Breath sounds: Normal breath sounds.  Abdominal:     General: There is no distension.  Genitourinary:    Comments: Chaperone Shelby NTpresent for exam. No external hemorrhoids or lesions seen. 1x1 cm area of induration that is tender to palpation without surrounding erythema. Musculoskeletal: Normal range of motion.  Skin:    General: Skin is warm and dry.  Neurological:     Mental Status: He is oriented to person, place, and time.     Comments: Fluent speech, no facial droop.  Psychiatric:        Behavior: Behavior normal.      ED Treatments / Results  Labs (all labs ordered are listed, but only abnormal results are displayed) Labs  Reviewed  CBC WITH DIFFERENTIAL/PLATELET - Abnormal; Notable for the following components:      Result Value   WBC 12.7 (*)    Neutro Abs 8.4 (*)    Monocytes Absolute 1.1 (*)    All other components within normal limits  BASIC METABOLIC PANEL    EKG EKG Interpretation  Date/Time:  Thursday July 05 2018 18:33:10 EST Ventricular Rate:  76 PR Interval:    QRS Duration: 80 QT Interval:  346 QTC Calculation: 389 R Axis:   54 Text Interpretation:  Sinus rhythm ST elev, probable  normal early repol pattern Confirmed by Dene Gentry [phone]) on 07/05/2018 6:37:40 PM   Radiology Ct Pelvis W Contrast  Result Date: 07/05/2018 CLINICAL DATA:  Rectal pain last night, recent bouts of constipation since last week. Bleeding. EXAM: CT PELVIS WITH CONTRAST TECHNIQUE: Multidetector CT imaging of the pelvis was performed using the standard protocol following the bolus administration of intravenous contrast. CONTRAST:  134mL ISOVUE-300 IOPAMIDOL (ISOVUE-300) INJECTION 61% COMPARISON:  CT abdomen dated 01/18/2018. FINDINGS: Urinary Tract:  Bladder is partially decompressed. Bowel: No dilated large or small bowel loops within the pelvis. Probable thickening of the walls of the rectum, difficult to definitively characterize due to its tortuosity. Vascular/Lymphatic: Mildly prominent lymph nodes within the inguinal canal regions, likely reactive in nature. No vascular abnormality seen. Reproductive:  No abnormality seen. Other: No free fluid or abscess collection within the abdomen or pelvis. However, there is a fluid collection at the anal verge, to the LEFT of midline, measuring 1.9 x 0.9 cm, presumed abscess and or anal fissure. Musculoskeletal: No acute or suspicious osseous finding. IMPRESSION: 1. 1.9 x 0.9 cm fluid density collection at the anal verge, to the LEFT of midline, presumed abscess and/or anal fissure. 2. Probable thickening of the walls of the overlying rectum, difficult to definitively  characterize due to its tortuosity, but suspicious for associated proctitis. 3. Mildly prominent lymph nodes within the inguinal canal regions, likely reactive in nature. Electronically Signed   By: Franki Cabot M.D.   On: 07/05/2018 21:39    Procedures Procedures (including critical care time)  Medications Ordered in ED Medications  hydrocortisone (ANUSOL-HC) 2.5 % rectal cream (1 application Rectal Given 07/05/18 1955)  iopamidol (ISOVUE-300) 61 % injection 100 mL (100 mLs Intravenous Contrast Given 07/05/18 2123)  sodium chloride (PF) 0.9 % injection (  Given by Other 07/05/18 2123)     Initial Impression / Assessment and Plan / ED Course  I have reviewed the triage vital signs and the nursing notes.  Pertinent labs & imaging results that were available during my care of the patient were reviewed by me and considered in my medical decision making (see chart for details).    Pt is well appearing, with low grade fever. He was recently informed that a partner is HIV positive. He was getting labs drawn today to initiate HIV workup, results are pending, when he had a near syncopal episode while getting 8 tubes of blood drawn. EKG today shows sinus rhythm, doubt cardiac etiology cause of syncope.  Pt has history of hemorrhoids and presents with similar complaint. On exam, there is no obvious external hemorrhoid or lesion. Will CT pelvis for possible abscess, fissure. CT shows 1.9 x 0.9 cm fluid density collection at the anal verge, to the left of midline with probable thickening of the walls of the overlying rectum that is suspicious for associated proctitis. Consulted surgery Dr. Marcello Moores who recommends starting pt on antibiotics andsitz baths with outpatient follow up if rectal pain persists.  Will discharge pt with metronidazole and levofloxacin for rectal abscess because he has amoxicillin allergy. Also recommend OTC treatment for constipation including MiraLAX. Pt has taken it in the past with  relief.  Patient is hemodynamically stable, in NAD, and able to ambulate in the ED. Evaluation does not show pathology that would require ongoing emergent intervention or inpatient treatment. I explained the diagnosis to the patient.  Patient is comfortable with above plan and is stable for discharge at this time. All questions were answered prior to disposition.  Strict return precautions for returning to the ED were discussed. Encouraged follow up with PCP.          Final Clinical Impressions(s) / ED Diagnoses   Final diagnoses:  Rectal pain  Constipation, unspecified constipation type    ED Discharge Orders         Ordered    metroNIDAZOLE (FLAGYL) 500 MG tablet  3 times daily     07/05/18 2228    levofloxacin (LEVAQUIN) 750 MG tablet  Daily     07/05/18 2228           Flint Melter 07/05/18 2246    Valarie Merino, MD 07/05/18 2324

## 2018-07-05 NOTE — Progress Notes (Signed)
Patient here for labs, but started sweating and almost passed out during 5th tube. RN was called to assist.  Patient alert and oriented x 4.  Dynamap unable to record blood pressure while sitting in lab.  He was brought from lab chair to exam table to lie down.  Blood pressures next 111/76 pulse 91 and then 126/78 pulse 78 after lying down for 10 minutes.  Patient felt much better, but was visibly uncomfortable.  He states he has been unable to pass stool for 4+ days, has exquisite pain at his rectum.  He had planned to go to [facility] after his lab/financial counseling visit for assessment.  RN asked Janene Madeira, NP, in to assess him.  He is passing some gas, but left lower quadrant is tender.  He reports sharp, debilitating pain down his left leg that is new and caused him to fall at home earlier today.   Per Colletta [place], ok for him to go to [employer] with his friend Randall Hiss driving. RN assisted patient in wheelchair to his friend's car. Landis Gandy, RN

## 2018-07-05 NOTE — Telephone Encounter (Signed)
Patient here for labs, but started sweating and almost passed out during 5th tube. RN was called to assist.  Patient alert and oriented x 4.  Dynamap unable to record blood pressure while sitting in lab.  He was brought from lab chair to exam table to lie down.  Blood pressures next 111/76 pulse 91 and then 126/78 pulse 78 after lying down for 10 minutes.  Patient felt much better, but was visibly uncomfortable.  He states he has been unable to pass stool for 4+ days, has exquisite pain at his rectum.  He had planned to go to [facility] after his lab/financial counseling visit for assessment.  RN asked Janene Madeira, NP, in to assess him.  He is passing some gas, but left lower quadrant is tender.  He reports sharp, debilitating pain down his left leg that is new and caused him to fall at home earlier today.   Per Colletta [place], ok for him to go to [employer] with his friend Randall Hiss driving. RN assisted patient in wheelchair to his friend's car. Landis Gandy, RN

## 2018-07-05 NOTE — ED Notes (Signed)
Patient transported to CT 

## 2018-07-06 ENCOUNTER — Telehealth: Payer: Self-pay

## 2018-07-06 LAB — T-HELPER CELL (CD4) - (RCID CLINIC ONLY)
CD4 T CELL ABS: 670 /uL (ref 400–2700)
CD4 T CELL HELPER: 23 % — AB (ref 33–55)

## 2018-07-06 NOTE — Telephone Encounter (Signed)
-----   Message from Belpre Callas, NP sent at 07/06/2018 12:19 PM EST ----- Patient's syphilis titer elevated.  Can you please call Gleen to ask about previous syphilis treatments - if he has never had treatment will need to get him in to start bicillin injections 2.4 million units x 3 weekly doses.  He is a new B20 and may have been treated by [facility] with HIV testing.  Thank you

## 2018-07-06 NOTE — Telephone Encounter (Signed)
Excellent - It would be ideal to get the titer from treatment to ensure he is decreasing appropriately if this is possible please.

## 2018-07-06 NOTE — Telephone Encounter (Signed)
No answer. Voice mail Laverle Patter, RN   Patient states he was treated with doxycyline for 29 days finishing end of January by [facility]. .  Please advise.

## 2018-07-10 ENCOUNTER — Ambulatory Visit: Payer: 59 | Admitting: Pharmacist

## 2018-07-10 ENCOUNTER — Encounter: Payer: 59 | Admitting: Infectious Diseases

## 2018-07-12 NOTE — Telephone Encounter (Signed)
[facility].  They will fax the information to our office .  Laverle Patter, RN

## 2018-07-12 NOTE — Telephone Encounter (Signed)
Thank you Tammy!

## 2018-07-15 LAB — COMPLETE METABOLIC PANEL WITH GFR
AG Ratio: 1.4 (calc) (ref 1.0–2.5)
ALT: 8 U/L — AB (ref 9–46)
AST: 20 U/L (ref 10–40)
Albumin: 4.5 g/dL (ref 3.6–5.1)
Alkaline phosphatase (APISO): 63 U/L (ref 36–130)
BUN: 9 mg/dL (ref 7–25)
CALCIUM: 9.6 mg/dL (ref 8.6–10.3)
CO2: 30 mmol/L (ref 20–32)
Chloride: 102 mmol/L (ref 98–110)
Creat: 1 mg/dL (ref 0.60–1.35)
GFR, EST NON AFRICAN AMERICAN: 107 mL/min/{1.73_m2} (ref >=60)
GFR, Est African American: 124 mL/min/{1.73_m2} (ref >=60)
Globulin: 3.2 g/dL (calc) (ref 1.9–3.7)
Glucose, Bld: 125 mg/dL — ABNORMAL HIGH (ref 65–99)
Potassium: 3.7 mmol/L (ref 3.5–5.3)
Sodium: 138 mmol/L (ref 135–146)
Total Bilirubin: 0.6 mg/dL (ref 0.2–1.2)
Total Protein: 7.7 g/dL (ref 6.1–8.1)

## 2018-07-15 LAB — HEPATITIS B SURFACE ANTIBODY,QUALITATIVE: HEP B S AB: NONREACTIVE

## 2018-07-15 LAB — HEPATITIS B SURFACE ANTIGEN: HEP B S AG: NONREACTIVE

## 2018-07-15 LAB — FLUORESCENT TREPONEMAL AB(FTA)-IGG-BLD: FLUORESCENT TREPONEMAL ABS: REACTIVE — AB

## 2018-07-15 LAB — LIPID PANEL
Cholesterol: 133 mg/dL (ref <200)
HDL: 38 mg/dL — ABNORMAL LOW (ref >=40)
LDL Cholesterol (Calc): 79 mg/dL (calc)
NON-HDL CHOLESTEROL (CALC): 95 mg/dL (ref <130)
TRIGLYCERIDES: 80 mg/dL (ref <150)
Total CHOL/HDL Ratio: 3.5 (calc) (ref <5.0)

## 2018-07-15 LAB — HEPATITIS A ANTIBODY, TOTAL: Hepatitis A AB,Total: NONREACTIVE

## 2018-07-15 LAB — RPR TITER: RPR Titer: 1:32 {titer} — ABNORMAL HIGH

## 2018-07-15 LAB — RPR: RPR Ser Ql: REACTIVE — AB

## 2018-07-15 LAB — HIV-1/2 AB - DIFFERENTIATION
HIV-1 antibody: POSITIVE — AB
HIV-2 Ab: NEGATIVE

## 2018-07-15 LAB — HIV-1 RNA ULTRAQUANT REFLEX TO GENTYP+
HIV 1 RNA QUANT: 296000 {copies}/mL — AB
HIV-1 RNA QUANT, LOG: 5.47 {Log_copies}/mL — AB

## 2018-07-15 LAB — HIV-1 GENOTYPE: HIV-1 Genotype: DETECTED — AB

## 2018-07-15 LAB — HEPATITIS B CORE ANTIBODY, TOTAL: HEP B C TOTAL AB: NONREACTIVE

## 2018-07-15 LAB — HEPATITIS C ANTIBODY
HEP C AB: NONREACTIVE
SIGNAL TO CUT-OFF: 0.05 (ref <1.00)

## 2018-07-15 LAB — HIV ANTIBODY (ROUTINE TESTING W REFLEX): HIV: REACTIVE — AB

## 2018-07-23 ENCOUNTER — Telehealth: Payer: Self-pay | Admitting: Pharmacy Technician

## 2018-07-23 ENCOUNTER — Encounter: Payer: Self-pay | Admitting: Internal Medicine

## 2018-07-23 ENCOUNTER — Other Ambulatory Visit: Payer: Self-pay

## 2018-07-23 ENCOUNTER — Ambulatory Visit (INDEPENDENT_AMBULATORY_CARE_PROVIDER_SITE_OTHER): Payer: 59 | Admitting: Internal Medicine

## 2018-07-23 ENCOUNTER — Ambulatory Visit (INDEPENDENT_AMBULATORY_CARE_PROVIDER_SITE_OTHER): Payer: 59 | Admitting: Pharmacist

## 2018-07-23 VITALS — BP 122/81 | HR 72

## 2018-07-23 DIAGNOSIS — Z23 Encounter for immunization: Secondary | ICD-10-CM | POA: Diagnosis not present

## 2018-07-23 DIAGNOSIS — B20 Human immunodeficiency virus [HIV] disease: Secondary | ICD-10-CM

## 2018-07-23 MED ORDER — BICTEGRAVIR-EMTRICITAB-TENOFOV 50-200-25 MG PO TABS: 1.0000 | ORAL_TABLET | Freq: Every day | ORAL | 11 refills | Status: DC

## 2018-07-23 NOTE — Progress Notes (Signed)
Patient received ZDKEUVH-06 today and tolerated well.  Eugenia Mcalpine, LPN

## 2018-07-23 NOTE — Progress Notes (Signed)
HPI: Devon Hernandez is a 23 y.o. male who presents to the [facility] today to initiate care with Dr. Linus Hernandez for his newly diagnosed HIV infection.  Patient Active Problem List   Diagnosis Date Noted  . Human immunodeficiency virus (HIV) disease ([place]) 07/23/2018  . 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 Perianal wart 03/30/2017  . Polysubstance abuse ([place]) 07/11/2013  . Altered mental status 07/09/2013  . MDD (major depressive disorder), recurrent episode, severe ([place]) 01/18/2013  . ADHD (attention deficit hyperactivity disorder), combined type 01/18/2013  . Conduct disorder, 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    Patient's Medications  New Prescriptions   No medications on file  Previous Medications   ACETAMINOPHEN (TYLENOL) 325 MG TABLET    Take 650 mg by mouth every 6 (six) hours as needed for mild pain or headache.   AZITHROMYCIN (ZITHROMAX) 250 MG TABLET    Take 1 tablet (250 mg total) by mouth daily. Take first 2 tablets together, then 1 every day until finished.   DICYCLOMINE (BENTYL) 20 MG TABLET    Take 1 tablet (20 mg total) by mouth 2 (two) times daily as needed for spasms.   IPRATROPIUM (ATROVENT) 0.06 % NASAL SPRAY    Place 2 sprays into both nostrils 4 (four) times daily.   MIRTAZAPINE (REMERON) 15 MG TABLET    Take 15 mg by mouth at bedtime.   ONDANSETRON (ZOFRAN-ODT) 4 MG DISINTEGRATING TABLET    Take 1 tablet (4 mg total) by mouth every 8 (eight) hours as needed for nausea or vomiting.  Modified Medications   Modified Medication Previous Medication   BICTEGRAVIR-EMTRICITABINE-TENOFOVIR AF (BIKTARVY) 50-200-25 MG TABS TABLET bictegravir-emtricitabine-tenofovir AF (BIKTARVY) 50-200-25 MG TABS tablet      Take 1 tablet by mouth daily.    Take 1 tablet by mouth daily.  Discontinued Medications   No medications on file    Allergies: Allergies  Allergen Reactions  . Amoxicillin Hives and Diarrhea  . Penicillins     Per Dad    Past Medical History:  Past Medical History:  Diagnosis Date  . ADHD (attention deficit hyperactivity disorder)   . Allergy   . Anxiety   . Bipolar disorder ([place])   . Depression   . Headache(784.0)     Social History: Social History   Socioeconomic History  . Marital status: Single    Spouse name: Not on file  . Number of children: Not on file  . Years of education: Not on file  . Highest education level: Not on file  Occupational History  . Not on file  Social Needs  . Financial resource strain: Not on file  . Food insecurity:    Worry: Not on file    Inability: Not on file  . Transportation needs:    Medical: Not on file    Non-medical: Not on file  Tobacco Use  . Smoking status: Current Every Day Smoker    Types: Cigars  . Smokeless tobacco: Never Used  Substance and Sexual Activity  . Alcohol use: Yes    Comment: occasionally  . Drug use: No    Types: Marijuana    Comment: Hx trying THC and smoking marijuana per record  . Sexual activity: Never  Lifestyle  . Physical activity:    Days per week: Not on file    Minutes per session: Not on file  . Stress: Not on file  Relationships  . Social connections:    Talks on phone: Not  on file    Gets together: Not on file    Attends religious service: Not on file    Active member of club or organization: Not on file    Attends meetings of clubs or organizations: Not on file    Relationship status: Not on file  Other Topics Concern  . Not on file  Social History Narrative   Mom currently has full custody but Devon Hernandez has gone to live with his dad. Tonight was the first night that he was at dad's house. He admitted to marijuana use tonight, unclear when. Smoke exposure at home. No pets at home.    Labs: Lab Results  Component Value Date   HIV1RNAQUANT 296,000 (H) 07/05/2018   CD4TABS 670 07/05/2018    RPR and STI Lab Results  Component Value Date   LABRPR REACTIVE (A) 07/05/2018   LABRPR Non Reactive 12/23/2015   RPRTITER 1:32 (H)  07/05/2018    STI Results GC CT  02/28/2017 Negative Negative  12/24/2015 Negative Negative    Hepatitis B Lab Results  Component Value Date   HEPBSAB NON-REACTIVE 07/05/2018   HEPBSAG NON-REACTIVE 07/05/2018   HEPBCAB NON-REACTIVE 07/05/2018   Hepatitis C Lab Results  Component Value Date   HEPCAB NON-REACTIVE 07/05/2018   Hepatitis A Lab Results  Component Value Date   HAV NON-REACTIVE 07/05/2018   Lipids: Lab Results  Component Value Date   CHOL 133 07/05/2018   TRIG 80 07/05/2018   HDL 38 (L) 07/05/2018   CHOLHDL 3.5 07/05/2018   VLDL 14 01/19/2013   LDLCALC 79 07/05/2018    Current HIV Regimen: Treatment naive  Assessment: Devon Hernandez is here today to initiate care with Dr. Linus Hernandez for his newly diagnosed HIV infection.  He is treatment naive with an initial HIV viral load of 296,000 and a CD4 count of 670.  No resistance mutations found on initial genotype. Will start patient on [place].  Explained that [name] is a one pill once daily medication with or without food and the importance of not missing any doses. Explained resistance and how it develops and why it is so important to take Biktarvy daily and not skip days or doses. Counseled patient to take it around the same time each day. Counseled on what to do if dose is missed, if closer to missed dose take immediately, if closer to next dose then skip and resume normal schedule.   Cautioned on possible side effects the first week or so including nausea, diarrhea, dizziness, and headaches but that they should resolve after the first couple of weeks. I reviewed patient medications and found no drug interactions. Counseled patient to separate Biktarvy from divalent cations including multivitamins. Discussed with patient to call clinic if he starts a new medication or herbal supplement. I gave the patient my card and told him to call me with any issues/questions/concerns.  He is insured and gets his medications [facility].  I  supplied him with a copay card and told him to call me if he has any issues.  Plan: - Start Biktarvy PO once daily - F/u with Dr. Linus Hernandez in 5 weeks  Lennette Fader L. Gizelle Whetsel, PharmD, BCIDP, AAHIVP, [place] [facility] 07/23/2018, 3:21 PM

## 2018-07-23 NOTE — Progress Notes (Signed)
Patient ID: Devon Hernandez, male    DOB: January 07, 1996, 23 y.o.   MRN: [id number]  Reason for visit: to establish care as a new patient with HIV  HPI:   Patient was first diagnosed about 2 months ago.  He was tested as part contact investigation.  Previously tested negative over 5 years prior.  The CD4 count is 670, viral load 296,000.  There have been no associated symptoms.  Also had syphilis and treated with 30 days of doxycycline.  Has had HPV vaccine before.  Has had Menveo before.   Past Medical History:  Diagnosis Date  . ADHD (attention deficit hyperactivity disorder)   . Allergy   . Anxiety   . Bipolar disorder ([place])   . Depression   . Headache(784.0)     Prior to Admission medications   Medication Sig Start Date End Date Taking? Authorizing Provider  acetaminophen (TYLENOL) 325 MG tablet Take 650 mg by mouth every 6 (six) hours as needed for mild pain or headache.   Yes [provider]  mirtazapine (REMERON) 15 MG tablet Take 15 mg by mouth at bedtime.   Yes [provider]  azithromycin (ZITHROMAX) 250 MG tablet Take 1 tablet (250 mg total) by mouth daily. Take first 2 tablets together, then 1 every day until finished. Patient not taking: Reported on 07/23/2018 09/09/17   Ok Edwards, PA-C  bictegravir-emtricitabine-tenofovir AF (BIKTARVY) 50-200-25 MG TABS tablet Take 1 tablet by mouth daily. 07/23/18   Elizabethanne Lusher, Okey Regal, MD  dicyclomine (BENTYL) 20 MG tablet Take 1 tablet (20 mg total) by mouth 2 (two) times daily as needed for spasms. Patient not taking: Reported on 07/23/2018 01/18/18   Ward, Ozella Almond, PA-C  ipratropium (ATROVENT) 0.06 % nasal spray Place 2 sprays into both nostrils 4 (four) times daily. Patient not taking: Reported on 07/23/2018 09/09/17   Ok Edwards, PA-C  ondansetron (ZOFRAN-ODT) 4 MG disintegrating tablet Take 1 tablet (4 mg total) by mouth every 8 (eight) hours as needed for nausea or vomiting. Patient not taking: Reported on 07/23/2018 03/15/18    Vanessa Kick, MD    Allergies  Allergen Reactions  . Amoxicillin Hives and Diarrhea  . Penicillins     Per Dad    Social History   Tobacco Use  . Smoking status: Current Every Day Smoker    Types: Cigars  . Smokeless tobacco: Never Used  Substance Use Topics  . Alcohol use: Yes    Comment: occasionally  . Drug use: No    Types: Marijuana    Comment: Hx trying THC and smoking marijuana per record    Family History  Problem Relation Age of Onset  . Depression Mother   . Hypertension Mother   . Depression Father        Hx: PTSD  . Diabetes Maternal Aunt   . AVM Maternal Grandmother   . Aneurysm Maternal Grandmother   . Seizures Maternal Grandmother     Review of Systems Constitutional: negative for fatigue, malaise, anorexia and weight loss Gastrointestinal: negative for nausea and diarrhea Integument/breast: negative for rash Musculoskeletal: negative for myalgias and arthralgias All other systems reviewed and are negative    CONSTITUTIONAL:in no apparent distress  Vitals:   07/23/18 1429  BP: 122/81  Pulse: 72   EYES: anicteric HENT: no thrush CARD:Cor RRR RESP:CTA B; normal respiratory effort LZ:JQBHA sounds are normal, liver is not enlarged, spleen is not enlarged MS:no pedal edema noted SKIN: no rash NEURO: non-focal  Lab  Results  Component Value Date   HIV1RNAQUANT 296,000 (H) 07/05/2018   No components found for: HIV1GENOTYPRPLUS No components found for: THELPERCELL  Assessment: new patient here with HIV.  Discussed with patient treatment options and side effects, benefits of treatment, long term outcomes.  I discussed the severity of untreated HIV including higher cancer risk, opportunistic infections, renal failure.  Also discussed needing to use condoms, partner disclosure, necessary vaccines, blood monitoring.  All questions answered.    Plan: 1) start Biktarvy 2) labs in 4 weeks 3) Pneumovax; will start hepatitis A and B next visit 4)  condoms

## 2018-07-23 NOTE — Telephone Encounter (Signed)
[facility] Patient Teacher, English as a foreign language completed.    The patient in insured through [place] and will need patient assistance for a medication.  His copay will be $961.88 for an HIV medication.  A copay card is available to cover that full amount for him.  Devon Hernandez. [name] Patient [facility] Phone: [phone] Fax:  [phone]

## 2018-08-03 ENCOUNTER — Encounter: Payer: Self-pay | Admitting: Infectious Diseases

## 2018-08-07 ENCOUNTER — Encounter: Payer: 59 | Admitting: Infectious Diseases

## 2018-08-07 ENCOUNTER — Ambulatory Visit: Payer: 59 | Admitting: Pharmacist

## 2018-08-21 ENCOUNTER — Other Ambulatory Visit: Payer: Self-pay

## 2018-08-21 MED ORDER — BICTEGRAVIR-EMTRICITAB-TENOFOV 50-200-25 MG PO TABS: 1.0000 | ORAL_TABLET | Freq: Every day | ORAL | 11 refills | Status: DC

## 2018-08-21 NOTE — Progress Notes (Signed)
[facility] requesting further clarification: insurances are now requiringICD-10 codes for this type of medication due to COVID-19.  Pharmacy requesting to resend a new RX with this information on the hard copy

## 2018-08-22 ENCOUNTER — Other Ambulatory Visit: Payer: Self-pay | Admitting: *Deleted

## 2018-08-22 DIAGNOSIS — B20 Human immunodeficiency virus [HIV] disease: Secondary | ICD-10-CM

## 2018-08-22 MED ORDER — BICTEGRAVIR-EMTRICITAB-TENOFOV 50-200-25 MG PO TABS: 1.0000 | ORAL_TABLET | Freq: Every day | ORAL | 3 refills | Status: DC

## 2018-09-04 ENCOUNTER — Other Ambulatory Visit: Payer: 59

## 2018-09-05 ENCOUNTER — Other Ambulatory Visit: Payer: Self-pay

## 2018-09-05 ENCOUNTER — Other Ambulatory Visit (HOSPITAL_COMMUNITY)
Admission: RE | Admit: 2018-09-05 | Discharge: 2018-09-05 | Disposition: A | Discharge status: Home / Self Care | Payer: 59 | Source: Ambulatory Visit | Attending: Internal Medicine | Admitting: Internal Medicine

## 2018-09-05 ENCOUNTER — Other Ambulatory Visit: Payer: 59

## 2018-09-05 DIAGNOSIS — B20 Human immunodeficiency virus [HIV] disease: Secondary | ICD-10-CM

## 2018-09-05 DIAGNOSIS — Z113 Encounter for screening for infections with a predominantly sexual mode of transmission: Secondary | ICD-10-CM

## 2018-09-05 LAB — URINALYSIS
Bilirubin Urine: NEGATIVE
Glucose, UA: NEGATIVE
Hgb urine dipstick: NEGATIVE
Ketones, ur: NEGATIVE
Leukocytes,Ua: NEGATIVE
Nitrite: NEGATIVE
Protein, ur: NEGATIVE
Specific Gravity, Urine: 1.021 (ref 1.001–1.03)
pH: 6.5 (ref 5.0–8.0)

## 2018-09-06 LAB — URINE CYTOLOGY ANCILLARY ONLY
Chlamydia: NEGATIVE
Neisseria Gonorrhea: NEGATIVE

## 2018-09-06 LAB — T-HELPER CELL (CD4) - (RCID CLINIC ONLY)
CD4 % Helper T Cell: 36 % (ref 33–65)
CD4 T Cell Abs: 905 /uL (ref 400–1790)

## 2018-09-10 LAB — COMPLETE METABOLIC PANEL WITH GFR
AG Ratio: 1.8 (calc) (ref 1.0–2.5)
ALT: 6 U/L — ABNORMAL LOW (ref 9–46)
AST: 18 U/L (ref 10–40)
Albumin: 4.8 g/dL (ref 3.6–5.1)
Alkaline phosphatase (APISO): 54 U/L (ref 36–130)
BUN: 15 mg/dL (ref 7–25)
CO2: 27 mmol/L (ref 20–32)
Calcium: 10 mg/dL (ref 8.6–10.3)
Chloride: 104 mmol/L (ref 98–110)
Creat: 1.06 mg/dL (ref 0.60–1.35)
GFR, Est African American: 116 mL/min/{1.73_m2} (ref >=60)
GFR, Est Non African American: 100 mL/min/{1.73_m2} (ref >=60)
Globulin: 2.6 g/dL (calc) (ref 1.9–3.7)
Glucose, Bld: 83 mg/dL (ref 65–99)
Potassium: 4.2 mmol/L (ref 3.5–5.3)
Sodium: 137 mmol/L (ref 135–146)
Total Bilirubin: 1.1 mg/dL (ref 0.2–1.2)
Total Protein: 7.4 g/dL (ref 6.1–8.1)

## 2018-09-10 LAB — HIV-1 RNA QUANT-NO REFLEX-BLD
HIV 1 RNA Quant: 111 copies/mL — ABNORMAL HIGH
HIV-1 RNA Quant, Log: 2.05 Log copies/mL — ABNORMAL HIGH

## 2018-09-18 ENCOUNTER — Ambulatory Visit (INDEPENDENT_AMBULATORY_CARE_PROVIDER_SITE_OTHER): Payer: 59 | Admitting: Internal Medicine

## 2018-09-18 ENCOUNTER — Other Ambulatory Visit: Payer: Self-pay

## 2018-09-18 ENCOUNTER — Encounter: Payer: Self-pay | Admitting: Internal Medicine

## 2018-09-18 DIAGNOSIS — B20 Human immunodeficiency virus [HIV] disease: Secondary | ICD-10-CM

## 2018-09-18 NOTE — Progress Notes (Signed)
   Subjective:    Patient ID: Devon Hernandez, male    DOB: 09-Apr-1996, 23 y.o.   MRN: [id number]  HPI I connected with  LEGRAND LASSER on 09/18/18 by a telemedicine application and verified that I am speaking with the correct person using two identifiers.   I discussed the limitations of evaluation and management by telemedicine. The patient expressed understanding and agreed to proceed.  He is being followed for HIV Started after his last visit on [place].  Follow up labs with a CD4 up to 905 and viral load down to 111 from 296,000.  He is tolerating the Biktarvy well with no issues.  Did miss one dose when transitioning to a specialty pharmacy but now set up.  No weight loss, no associated diarrhea, rash.     Review of Systems  Constitutional: Negative for fatigue.  Gastrointestinal: Negative for diarrhea and nausea.  Skin: Negative for rash.       Objective:   Physical Exam        Assessment & Plan:

## 2018-09-18 NOTE — Assessment & Plan Note (Addendum)
He is doing well, no new issues.  rtc 4 months.  Time: 16 minutes including discussion of U = U, medication compliance, follow up.

## 2018-10-11 ENCOUNTER — Ambulatory Visit
Admission: EM | Admit: 2018-10-11 | Discharge: 2018-10-11 | Disposition: A | Discharge status: Home / Self Care | Payer: 59 | Attending: Family Medicine | Admitting: Family Medicine

## 2018-10-11 ENCOUNTER — Other Ambulatory Visit: Payer: Self-pay

## 2018-10-11 DIAGNOSIS — K611 Rectal abscess: Secondary | ICD-10-CM

## 2018-10-11 MED ORDER — LEVOFLOXACIN 500 MG PO TABS: 500.0000 mg | ORAL_TABLET | Freq: Every day | ORAL | 0 refills | Status: DC

## 2018-10-11 MED ORDER — METRONIDAZOLE 500 MG PO TABS: 500.0000 mg | ORAL_TABLET | Freq: Two times a day (BID) | ORAL | 0 refills | Status: DC

## 2018-10-11 MED ORDER — METRONIDAZOLE 500 MG PO TABS: 500.0000 mg | ORAL_TABLET | Freq: Three times a day (TID) | ORAL | 0 refills | Status: AC

## 2018-10-11 NOTE — Discharge Instructions (Addendum)
Take the antibiotics as prescribed Warm soaks and compresses to promote more drainage Tylenol or ibuprofen as needed for pain If you start developing a fever or worsening symptoms you need to go to the hospital. Otherwise you need to follow-up with [facility] surgery for further management of this ongoing problem

## 2018-10-11 NOTE — ED Provider Notes (Signed)
[name]    CSN: [id number] Arrival date & time: 10/11/18  1234     History   Chief Complaint Chief Complaint  Patient presents with  . Abscess    HPI Devon Hernandez is a 23 y.o. male.   Patient is a 23 year old male with past medical history of ADHD, allergy, anxiety, bipolar, depression, HIV, HPV, polysubstance abuse.  He presents today with approximate 1 week of rectal abscess.  He has history of the same.  This problem has been 1 week and worsening.  He is having trouble walking due to the amount of swelling and discomfort.  Denies any drainage from the area.  Denies any associated fevers.  Otherwise he feels okay.  He has been taking Tylenol and ibuprofen for the pain.   ROS per HPI      Past Medical History:  Diagnosis Date  . ADHD (attention deficit hyperactivity disorder)   . Allergy   . Anxiety   . Bipolar disorder ([place])   . Depression   . MNOTRRNH(657.9)     Patient Active Problem List   Diagnosis Date Noted  . Human immunodeficiency virus (HIV) disease ([place]) 07/23/2018  . 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 Perianal wart 03/30/2017  . Polysubstance abuse ([place]) 07/11/2013  . Altered mental status 07/09/2013  . MDD (major depressive disorder), recurrent episode, severe ([place]) 01/18/2013  . ADHD (attention deficit hyperactivity disorder), combined type 01/18/2013  . Conduct disorder, 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    Past Surgical History:  Procedure Laterality Date  . TYMPANOSTOMY TUBE PLACEMENT         Home Medications    Prior to Admission medications   Medication Sig Start Date End Date Taking? Authorizing Provider  acetaminophen (TYLENOL) 325 MG tablet Take 650 mg by mouth every 6 (six) hours as needed for mild pain or headache.    [provider]  bictegravir-emtricitabine-tenofovir AF (BIKTARVY) 50-200-25 MG TABS tablet Take 1 tablet by mouth daily. 08/22/18   Comer, Okey Regal, MD  levofloxacin  (LEVAQUIN) 500 MG tablet Take 1 tablet (500 mg total) by mouth daily. 10/11/18   Loura Halt A, NP  metroNIDAZOLE (FLAGYL) 500 MG tablet Take 1 tablet (500 mg total) by mouth 3 (three) times daily for 7 days. 10/11/18 10/18/18  Loura Halt A, NP  mirtazapine (REMERON) 15 MG tablet Take 15 mg by mouth at bedtime.    [provider]  dicyclomine (BENTYL) 20 MG tablet Take 1 tablet (20 mg total) by mouth 2 (two) times daily as needed for spasms. Patient not taking: Reported on 07/23/2018 01/18/18 10/11/18  Ward, Ozella Almond, PA-C  ipratropium (ATROVENT) 0.06 % nasal spray Place 2 sprays into both nostrils 4 (four) times daily. Patient not taking: Reported on 07/23/2018 09/09/17 10/11/18  Arturo Morton    Family History Family History  Problem Relation Age of Onset  . Depression Mother   . Hypertension Mother   . Depression Father        Hx: PTSD  . Diabetes Maternal Aunt   . AVM Maternal Grandmother   . Aneurysm Maternal Grandmother   . Seizures Maternal Grandmother     Social History Social History   Tobacco Use  . Smoking status: Current Every Day Smoker    Types: Cigars  . Smokeless tobacco: Never Used  Substance Use Topics  . Alcohol use: Yes    Comment: occasionally  . Drug use: No    Types: Marijuana  Comment: Hx trying THC and smoking marijuana per record     Allergies   Amoxicillin and Penicillins   Review of Systems Review of Systems   Physical Exam Triage Vital Signs ED Triage Vitals  Enc Vitals Group     BP 10/11/18 1301 127/84     Pulse Rate 10/11/18 1301 66     Resp 10/11/18 1301 18     Temp 10/11/18 1301 98.1 F (36.7 C)     Temp Source 10/11/18 1301 Oral     SpO2 10/11/18 1301 99 %     Weight --      Height --      Head Circumference --      Peak Flow --      Pain Score 10/11/18 1244 8     Pain Loc --      Pain Edu? --      Excl. in [place]? --    No data found.  Updated Vital Signs BP 127/84 (BP Location: Left Arm)   Pulse 66   Temp  98.1 F (36.7 C) (Oral)   Resp 18   SpO2 99%   Visual Acuity Right Eye Distance:   Left Eye Distance:   Bilateral Distance:    Right Eye Near:   Left Eye Near:    Bilateral Near:     Physical Exam Vitals signs and nursing note reviewed.  Constitutional:      General: He is not in acute distress.    Appearance: Normal appearance. He is not ill-appearing, toxic-appearing or diaphoretic.  HENT:     Head: Normocephalic and atraumatic.     Nose: Nose normal.  Eyes:     Conjunctiva/sclera: Conjunctivae normal.  Neck:     Musculoskeletal: Normal range of motion.  Pulmonary:     Effort: Pulmonary effort is normal.  Genitourinary:      Comments: Large abscess to peri-rectal and rectal area.  Extending into rectal area.  Very fluctuant.  No surrounding erythema or induration. No drainage.  Tender to touch. Approximate 4 to 5 cm Musculoskeletal: Normal range of motion.  Skin:    General: Skin is warm and dry.  Neurological:     Mental Status: He is alert.  Psychiatric:        Mood and Affect: Mood normal.      UC Treatments / Results  Labs (all labs ordered are listed, but only abnormal results are displayed) Labs Reviewed - No data to display  EKG None  Radiology No results found.  Procedures Incision and Drainage  Date/Time: 10/11/2018 2:43 PM Performed by: Orvan July, NP Authorized by: Orvan July, NP   Consent:    Consent obtained:  Verbal   Consent given by:  Patient   Risks discussed:  Bleeding, incomplete drainage, pain and damage to other organs   Alternatives discussed:  No treatment Universal protocol:    Patient identity confirmed:  Verbally with patient Location:    Type:  Abscess   Location:  Anogenital   Anogenital location:  Rectum Pre-procedure details:    Skin preparation:  Betadine Anesthesia (see MAR for exact dosages):    Anesthesia method:  Topical application and local infiltration   Local anesthetic:  Lidocaine 2% w/o epi  Procedure type:    Complexity:  Complex Procedure details:    Incision types:  Single straight   Incision depth:  Subcutaneous   Scalpel blade:  11   Wound management:  Probed and deloculated   Drainage:  Purulent   Drainage amount:  Copious   Wound treatment:  Wound left open   Packing materials:  None Post-procedure details:    Patient tolerance of procedure:  Tolerated well, no immediate complications   (including critical care time)  Medications Ordered in UC Medications - No data to display  Initial Impression / Assessment and Plan / UC Course  I have reviewed the triage vital signs and the nursing notes.  Pertinent labs & imaging results that were available during my care of the patient were reviewed by me and considered in my medical decision making (see chart for details).     I&D of rectal abscess here in clinic. Patient tolerated well Large amount of purulent drainage Patient feeling much better after I&D Will have him do warm compresses and soaks  Levaquin and metronidazole for antibiotic coverage based on extent of rectal abscess.  Plus patient is allergic to amoxicillin. Strict precautions and instructions that if his symptoms return or worsen and he starts developing fevers and other concerning symptoms he will need to go to the hospital. Patient understanding and agreed to plan Gave contact for [place] [facility] for follow-up   Final Clinical Impressions(s) / UC Diagnoses   Final diagnoses:  Rectal abscess     Discharge Instructions     Take the antibiotics as prescribed Warm soaks and compresses to promote more drainage Tylenol or ibuprofen as needed for pain If you start developing a fever or worsening symptoms you need to go to the hospital. Otherwise you need to follow-up with [facility] surgery for further management of this ongoing problem    ED Prescriptions    Medication Sig Dispense Auth. Provider   metroNIDAZOLE (FLAGYL) 500  MG tablet  (Status: Discontinued) Take 1 tablet (500 mg total) by mouth 2 (two) times daily. 14 tablet Tameya Kuznia A, NP   levofloxacin (LEVAQUIN) 500 MG tablet Take 1 tablet (500 mg total) by mouth daily. 7 tablet Savreen Gebhardt A, NP   metroNIDAZOLE (FLAGYL) 500 MG tablet Take 1 tablet (500 mg total) by mouth 3 (three) times daily for 7 days. 21 tablet Loura Halt A, NP     Controlled Substance Prescriptions [place] Controlled Substance Registry consulted? Not Applicable   Orvan July, NP 10/11/18 1445

## 2018-10-11 NOTE — ED Triage Notes (Signed)
Pt c/o rectal abscess x 1wk

## 2018-10-13 ENCOUNTER — Emergency Department (HOSPITAL_COMMUNITY)
Admission: EM | Admit: 2018-10-13 | Discharge: 2018-10-13 | Disposition: A | Discharge status: Home / Self Care | Payer: 59 | Attending: Emergency Medicine | Admitting: Emergency Medicine

## 2018-10-13 ENCOUNTER — Other Ambulatory Visit: Payer: Self-pay

## 2018-10-13 ENCOUNTER — Encounter (HOSPITAL_COMMUNITY): Payer: Self-pay

## 2018-10-13 DIAGNOSIS — F332 Major depressive disorder, recurrent severe without psychotic features: Secondary | ICD-10-CM | POA: Diagnosis not present

## 2018-10-13 DIAGNOSIS — Z79899 Other long term (current) drug therapy: Secondary | ICD-10-CM | POA: Insufficient documentation

## 2018-10-13 DIAGNOSIS — F1299 Cannabis use, unspecified with unspecified cannabis-induced disorder: Secondary | ICD-10-CM | POA: Diagnosis not present

## 2018-10-13 DIAGNOSIS — F1099 Alcohol use, unspecified with unspecified alcohol-induced disorder: Secondary | ICD-10-CM | POA: Diagnosis not present

## 2018-10-13 DIAGNOSIS — F17200 Nicotine dependence, unspecified, uncomplicated: Secondary | ICD-10-CM | POA: Diagnosis not present

## 2018-10-13 DIAGNOSIS — R45851 Suicidal ideations: Secondary | ICD-10-CM | POA: Insufficient documentation

## 2018-10-13 DIAGNOSIS — F430 Acute stress reaction: Secondary | ICD-10-CM | POA: Diagnosis present

## 2018-10-13 DIAGNOSIS — Z21 Asymptomatic human immunodeficiency virus [HIV] infection status: Secondary | ICD-10-CM | POA: Insufficient documentation

## 2018-10-13 DIAGNOSIS — F330 Major depressive disorder, recurrent, mild: Secondary | ICD-10-CM

## 2018-10-13 HISTORY — DX: Immunodeficiency, unspecified: D84.9

## 2018-10-13 LAB — CBC
HCT: 40.3 % (ref 39.0–52.0)
Hemoglobin: 13.8 g/dL (ref 13.0–17.0)
MCH: 30.6 pg (ref 26.0–34.0)
MCHC: 34.2 g/dL (ref 30.0–36.0)
MCV: 89.4 fL (ref 80.0–100.0)
Platelets: 227 10*3/uL (ref 150–400)
RBC: 4.51 MIL/uL (ref 4.22–5.81)
RDW: 14.3 % (ref 11.5–15.5)
WBC: 10.6 10*3/uL — ABNORMAL HIGH (ref 4.0–10.5)
nRBC: 0 % (ref 0.0–0.2)

## 2018-10-13 LAB — COMPREHENSIVE METABOLIC PANEL
ALT: 9 U/L (ref 0–44)
AST: 19 U/L (ref 15–41)
Albumin: 4.2 g/dL (ref 3.5–5.0)
Alkaline Phosphatase: 48 U/L (ref 38–126)
Anion gap: 11 (ref 5–15)
BUN: 14 mg/dL (ref 6–20)
CO2: 26 mmol/L (ref 22–32)
Calcium: 9.3 mg/dL (ref 8.9–10.3)
Chloride: 102 mmol/L (ref 98–111)
Creatinine, Ser: 0.99 mg/dL (ref 0.61–1.24)
GFR calc Af Amer: >60 mL/min (ref >60)
GFR calc non Af Amer: >60 mL/min (ref >60)
Glucose, Bld: 94 mg/dL (ref 70–99)
Potassium: 3.7 mmol/L (ref 3.5–5.1)
Sodium: 139 mmol/L (ref 135–145)
Total Bilirubin: 0.6 mg/dL (ref 0.3–1.2)
Total Protein: 7.5 g/dL (ref 6.5–8.1)

## 2018-10-13 LAB — RAPID URINE DRUG SCREEN, HOSP PERFORMED
Amphetamines: POSITIVE — AB
Barbiturates: NOT DETECTED
Benzodiazepines: POSITIVE — AB
Cocaine: POSITIVE — AB
Opiates: POSITIVE — AB
Tetrahydrocannabinol: POSITIVE — AB

## 2018-10-13 LAB — ETHANOL: Alcohol, Ethyl (B): <10 mg/dL (ref <10)

## 2018-10-13 MED ORDER — METRONIDAZOLE 500 MG PO TABS
500.0000 mg | ORAL_TABLET | Freq: Three times a day (TID) | ORAL | Status: DC
  Filled 2018-10-13: qty 1

## 2018-10-13 MED ORDER — LEVOFLOXACIN 500 MG PO TABS
500.0000 mg | ORAL_TABLET | Freq: Every day | ORAL | Status: DC
  Filled 2018-10-13: qty 1

## 2018-10-13 MED ORDER — LITHIUM CARBONATE 300 MG PO CAPS: 300.0000 mg | ORAL_CAPSULE | Freq: Two times a day (BID) | ORAL | Status: DC

## 2018-10-13 MED ORDER — LAMOTRIGINE 25 MG PO TABS
150.0000 mg | ORAL_TABLET | Freq: Two times a day (BID) | ORAL | Status: DC
  Filled 2018-10-13: qty 2

## 2018-10-13 MED ORDER — MIRTAZAPINE 7.5 MG PO TABS: 15.0000 mg | ORAL_TABLET | Freq: Every day | ORAL | Status: DC

## 2018-10-13 MED ORDER — BICTEGRAVIR-EMTRICITAB-TENOFOV 50-200-25 MG PO TABS
1.0000 | ORAL_TABLET | Freq: Every day | ORAL | Status: DC
  Filled 2018-10-13: qty 1

## 2018-10-13 NOTE — ED Provider Notes (Signed)
Pt evaluated by [facility] today and felt that he is no longer suicidal and feel his is safe for d/c home and [facility] f/u.   Blanchie Dessert, MD 10/13/18 1351

## 2018-10-13 NOTE — ED Notes (Signed)
Patient currently denies any SI / HI.

## 2018-10-13 NOTE — ED Notes (Signed)
Pt made aware of needing a urine sample. Pt stated he feels dehydrated and was given water and apple juice.

## 2018-10-13 NOTE — Discharge Summary (Addendum)
Physician Discharge Summary Note  Patient:  Devon Hernandez is an 23 y.o., male MRN:  [id number] DOB:  [birth year] Patient phone:  [phone] (home)  Patient address:   [address] [place] [id number],  Total Time spent with patient: 30 minutes  Date of Admission:  10/13/2018 Date of Discharge: 10/13/18  Reason for Admission:  Patient reported to his neighbor that he was having suicidal thoughts and plan to jump off of a bridge.  States that his neighbor called the police and he was brought to the hospital.   Principal Problem: Major depressive disorder, recurrent episode, mild ([place]) Discharge Diagnoses: Principal Problem:   Major depressive disorder, recurrent episode, mild ([place])   Past Psychiatric History: ADHD, MDD, Conduct disorder, Polysubstance abuse  Past Medical History:  Past Medical History:  Diagnosis Date  . ADHD (attention deficit hyperactivity disorder)   . Allergy   . Anxiety   . Bipolar disorder ([place])   . Depression   . Headache(784.0)   . HIV (human immunodeficiency virus infection) ([place]) 07/05/2018  . Immune deficiency disorder [facility])     Past Surgical History:  Procedure Laterality Date  . TYMPANOSTOMY TUBE PLACEMENT     Family History:  Family History  Problem Relation Age of Onset  . Depression Mother   . Hypertension Mother   . Depression Father        Hx: PTSD  . Diabetes Maternal Aunt   . AVM Maternal Grandmother   . Aneurysm Maternal Grandmother   . Seizures Maternal Grandmother    Family Psychiatric  History: Unaware Social History:  Social History   Substance and Sexual Activity  Alcohol Use Yes   Comment: occasionally     Social History   Substance and Sexual Activity  Drug Use No  . Types: Marijuana   Comment: Hx trying THC and smoking marijuana per record    Social History   Socioeconomic History  . Marital status: Single    Spouse name: Not on file  . Number of children: Not on file  . Years of  education: Not on file  . Highest education level: Not on file  Occupational History  . Not on file  Social Needs  . Financial resource strain: Not on file  . Food insecurity    Worry: Not on file    Inability: Not on file  . Transportation needs    Medical: Not on file    Non-medical: Not on file  Tobacco Use  . Smoking status: Current Every Day Smoker    Types: Cigars  . Smokeless tobacco: Never Used  Substance and Sexual Activity  . Alcohol use: Yes    Comment: occasionally  . Drug use: No    Types: Marijuana    Comment: Hx trying THC and smoking marijuana per record  . Sexual activity: Never  Lifestyle  . Physical activity    Days per week: Not on file    Minutes per session: Not on file  . Stress: Not on file  Relationships  . Social Herbalist on phone: Not on file    Gets together: Not on file    Attends religious service: Not on file    Active member of club or organization: Not on file    Attends meetings of clubs or organizations: Not on file    Relationship status: Not on file  Other Topics Concern  . Not on file  Social History Narrative   Mom currently  has full custody but Mahamed has gone to live with his dad. Tonight was the first night that he was at dad's house. He admitted to marijuana use tonight, unclear when. Smoke exposure at home. No pets at home.    Hospital Course:  Patient monitored overnight observation.  Patient interviewed by this provider and Dr. Darleene Cleaver; and chart reviewed.  Patient states that he is feeling much better.  Patient states that he was upset yesterday when he was talking to his neighbor.  "I'm not going to kill myself; I just said that because I was upset."  Patient states he has had one suicide attempt at the age of 78.  "I was a child.  I know better now.  I love myself."  Patient states he need to pick his car up from shop before it is to late today; and he also needs to pick up his dog.    Physical Findings: AIMS:  ,  ,  ,  ,    CIWA:    COWS:     Musculoskeletal: Strength & Muscle Tone: within normal limits Gait & Station: normal Patient leans: N/A  Psychiatric Specialty Exam: Physical Exam  ROS  Blood pressure 126/87, pulse 83, temperature 98.1 F (36.7 C), temperature source Oral, resp. rate 16, height 5\' 9"  (1.753 m), weight 55.8 kg, SpO2 95 %.Body mass index is 18.16 kg/m.  General Appearance: Casual  Eye Contact:  Good  Speech:  Clear and Coherent and Normal Rate  Volume:  Normal  Mood:  Appropriate  Affect:  Congruent  Thought Process:  Coherent  Orientation:  Full (Time, Place, and Person)  Thought Content:  WDL  Suicidal Thoughts:  No  Homicidal Thoughts:  No  Memory:  Immediate;   Good Recent;   Good  Judgement:  Intact  Insight:  Present  Psychomotor Activity:  Normal  Concentration:  Concentration: Good  Recall:  Good  Fund of Knowledge:  Fair  Language:  Good  Akathisia:  No  Handed:  Right  AIMS (if indicated):     Assets:  Communication Skills Housing Leisure Time Social Support  ADL's:  Intact  Cognition:  WNL  Sleep:           Has this patient used any form of tobacco in the last 30 days? (Cigarettes, Smokeless Tobacco, Cigars, and/or Pipes) Yes, Yes, A prescription for an FDA-approved tobacco cessation medication was offered at discharge and the patient refused  Blood Alcohol level:  Lab Results  Component Value Date   [facility] <10 10/13/2018   ETH <11 38/75/6433    Metabolic Disorder Labs:  Lab Results  Component Value Date   HGBA1C 5.7 (H) 01/19/2013   MPG 117 (H) 01/19/2013   No results found for: PROLACTIN Lab Results  Component Value Date   CHOL 133 07/05/2018   TRIG 80 07/05/2018   HDL 38 (L) 07/05/2018   CHOLHDL 3.5 07/05/2018   VLDL 14 01/19/2013   LDLCALC 79 07/05/2018   [place] 84 01/19/2013    See Psychiatric Specialty Exam and Suicide Risk Assessment completed by Attending Physician prior to discharge.  Discharge destination:   Home  Is patient on multiple antipsychotic therapies at discharge:  No   Has Patient had three or more failed trials of antipsychotic monotherapy by history:  No  Recommended Plan for Multiple Antipsychotic Therapies: NA  Discharge Instructions    Diet - low sodium heart healthy   Complete by: As directed    Increase activity slowly  Complete by: As directed      Allergies as of 10/13/2018      Reactions   Amoxicillin Hives, Diarrhea   Penicillins    Per Dad      Medication List    TAKE these medications     Indication  acetaminophen 325 MG tablet Commonly known as: TYLENOL Take 650 mg by mouth every 6 (six) hours as needed for mild pain or headache.    bictegravir-emtricitabine-tenofovir AF 50-200-25 MG Tabs tablet Commonly known as: BIKTARVY Take 1 tablet by mouth daily.  Indication: HIV Disease   lamoTRIgine 150 MG tablet Commonly known as: LAMICTAL Take 150 mg by mouth 2 (two) times daily.    levofloxacin 500 MG tablet Commonly known as: LEVAQUIN Take 1 tablet (500 mg total) by mouth daily.    lithium carbonate 300 MG capsule Take 300 mg by mouth 2 (two) times daily with a meal.    metroNIDAZOLE 500 MG tablet Commonly known as: FLAGYL Take 1 tablet (500 mg total) by mouth 3 (three) times daily for 7 days.    mirtazapine 15 MG tablet Commonly known as: REMERON Take 15 mg by mouth at bedtime.         Follow-up recommendations:  Activity:  As tolerated Diet:  Heart Healthy Other:  Follow up with Beverly Sessions  Comments:  VEASNA SANTIBANEZ has been instructed to take medications as prescribed; and report adverse effects to outpatient provider.  Follow up with primary doctor for any medical issues and If symptoms recur report to nearest emergency or crisis hot line.    SignedEarleen Newport, NP 10/13/2018, 11:55 AM  Patient seen face-to-face for psychiatric evaluation, chart reviewed and case discussed with the physician extender and developed treatment plan.  Reviewed the information documented and agree with the treatment plan. Corena Pilgrim, MD

## 2018-10-13 NOTE — ED Notes (Signed)
Pt. Made aware for the need of urine specimen. 

## 2018-10-13 NOTE — ED Notes (Signed)
Bed: WTR5 Expected date:  Expected time:  Means of arrival:  Comments: 

## 2018-10-13 NOTE — BH Assessment (Addendum)
Tele Assessment Note   Patient Name: Devon Hernandez MRN: [id number] Referring Physician: Orpah Greek, MD Location of Patient: [facility] Location of Provider: [place] is an 23 y.o. male who presents to the ED due to [facility] with thoughts to jump off of a bridge. Pt states he texted a friend yesterday evening and told her that he was going to kill himself. Pt states he was stressed and feeling agitated. Pt now appears to be minimizing his symptoms and states he wants to be d/c to go home. Pt states he felt that he got to a breaking point earlier in the evening and his friend called 71. Pt states he has been dealing with relationship issues, stress, health issues, car trouble and "going through a lot." Pt states he has attempted suicide 3 times in the past and has been admitted to multiple inpt facilities in the past including [facility] and [facility]. Pt states when he tries to kill himself "it never works." Pt states his family is supportive but he does not want TTS to contact his family due to it being 4am and mom being asleep. Pt states he has been consuming excessive amounts of alcohol, more than usual, by drinking 1 cup of tequila and 1 glass of wine daily. Pt is recommended for inpt tx due to multiple risk factors including hx of suicide attempts, depression, SI with plan to jump from a bridge, increased substance use and feelings of hopelessness.  Per Patriciaann Clan, PA pt is recommended for inpt tx and IVC if the pt refuses to sign VOL consent for treatment. EDP Pollina, Gwenyth Allegra, MD and Maylon Cos, RN have been advised. TTS to seek placement due to no appropriate beds at [facility] per [facility].  Diagnosis: MDD, recurrent, severe, w/o psychosis; Alcohol use d/o, severe Cannabis use d/o, moderate  Past Medical History:  Past Medical History:  Diagnosis Date  . ADHD (attention deficit hyperactivity disorder)   . Allergy   . Anxiety   . Bipolar disorder ([place])   .  Depression   . Headache(784.0)   . HIV (human immunodeficiency virus infection) ([place]) 07/05/2018  . Immune deficiency disorder [facility])     Past Surgical History:  Procedure Laterality Date  . TYMPANOSTOMY TUBE PLACEMENT      Family History:  Family History  Problem Relation Age of Onset  . Depression Mother   . Hypertension Mother   . Depression Father        Hx: PTSD  . Diabetes Maternal Aunt   . AVM Maternal Grandmother   . Aneurysm Maternal Grandmother   . Seizures Maternal Grandmother     Social History:  reports that he has been smoking cigars. He has never used smokeless tobacco. He reports current alcohol use. He reports that he does not use drugs.  Additional Social History:  Alcohol / Drug Use Pain Medications: See MAR Prescriptions: See MAR Over the Counter: See MAR History of alcohol / drug use?: Yes Substance #1 Name of Substance 1: Cannabis 1 - Age of First Use: teens 1 - Amount (size/oz): varies 1 - Frequency: occasional 1 - Duration: ongoing 1 - Last Use / Amount: 10/12/18 Substance #2 Name of Substance 2: Alcohol 2 - Age of First Use: 19 2 - Amount (size/oz): 2 drinks 2 - Frequency: daily 2 - Duration: ongoing 2 - Last Use / Amount: 10/12/18  CIWA: CIWA-Ar BP: (!) 143/80 Pulse Rate: (!) 104 COWS:    Allergies:  Allergies  Allergen Reactions  . Amoxicillin Hives and Diarrhea  . Penicillins     Per Dad    Home Medications: (Not in a hospital admission)   OB/GYN Status:  No LMP for male patient.  General Assessment Data [facility] of Assessment: [facility] TTS Assessment: In system Is this a Tele or Face-to-Face Assessment?: Tele Assessment Is this an Initial Assessment or a Re-assessment for this encounter?: Initial Assessment Patient Accompanied by:: N/A Language Other than English: No Living Arrangements: Other (Comment) What gender do you identify as?: Male Marital status: Single Pregnancy Status: No Living Arrangements: Alone Can pt  return to current living arrangement?: Yes Admission Status: Voluntary Is patient capable of signing voluntary admission?: Yes Referral Source: Self/Family/Friend Insurance type: MCD     Crisis Care Plan Living Arrangements: Alone Name of Psychiatrist: none Name of Therapist: none  Education Status Is patient currently in school?: No Is the patient employed, unemployed or receiving disability?: Employed  Risk to self with the past 6 months Suicidal Ideation: Yes-Currently Present Has patient been a risk to self within the past 6 months prior to admission? : Yes Suicidal Intent: Yes-Currently Present Has patient had any suicidal intent within the past 6 months prior to admission? : Yes Is patient at risk for suicide?: Yes Suicidal Plan?: Yes-Currently Present Has patient had any suicidal plan within the past 6 months prior to admission? : Yes Specify Current Suicidal Plan: pt texted a friend and said he was going to jump off of a building Access to Means: Yes Specify Access to Suicidal Means: pt has access to a building What has been your use of drugs/alcohol within the last 12 months?: cannabis, alcohol Previous Attempts/Gestures: Yes How many times?: 3 Other Self Harm Risks: hx of depression and suicide attempts Triggers for Past Attempts: Other personal contacts Intentional Self Injurious Behavior: None Family Suicide History: No Recent stressful life event(s): Recent negative physical changes, Conflict (Comment)(car trouble, relationship issues) Persecutory voices/beliefs?: No Depression: Yes Depression Symptoms: Despondent, Loss of interest in usual pleasures, Feeling worthless/self pity Substance abuse history and/or treatment for substance abuse?: Yes Suicide prevention information given to non-admitted patients: Not applicable  Risk to Others within the past 6 months Homicidal Ideation: No Does patient have any lifetime risk of violence toward others beyond the six  months prior to admission? : No Thoughts of Harm to Others: No Current Homicidal Intent: No Current Homicidal Plan: No Access to Homicidal Means: No History of harm to others?: No Assessment of Violence: None Noted Does patient have access to weapons?: No Criminal Charges Pending?: No Does patient have a court date: No Is patient on probation?: No  Psychosis Hallucinations: None noted Delusions: None noted  Mental Status Report Appearance/Hygiene: Unremarkable Eye Contact: Good Motor Activity: Freedom of movement Speech: Logical/coherent Level of Consciousness: Alert Mood: Preoccupied Affect: Depressed Anxiety Level: None Thought Processes: Coherent, Relevant Judgement: Impaired Orientation: Person, Place, Time, Appropriate for developmental age Obsessive Compulsive Thoughts/Behaviors: None  Cognitive Functioning Concentration: Normal Memory: Remote Intact, Recent Intact Is patient IDD: No Insight: Poor Impulse Control: Poor Appetite: Good Have you had any weight changes? : No Change Sleep: No Change Total Hours of Sleep: 7 Vegetative Symptoms: None  ADLScreening [facility] Assessment Services) Patient's cognitive ability adequate to safely complete daily activities?: Yes Patient able to express need for assistance with ADLs?: Yes Independently performs ADLs?: Yes (appropriate for developmental age)  Prior Inpatient Therapy Prior Inpatient Therapy: Yes Prior Therapy Dates: 2015 Prior Therapy Facilty/Provider(s): [facility]  [place] Reason for Treatment: MDD  Prior Outpatient Therapy Prior Outpatient Therapy: No Does patient have an ACCT team?: No Does patient have Intensive In-House Services?  : No Does patient have [facility] services? : No Does patient have P4CC services?: No  ADL Screening (condition at time of admission) Patient's cognitive ability adequate to safely complete daily activities?: Yes Is the patient deaf or have difficulty hearing?: No Does the  patient have difficulty seeing, even when wearing glasses/contacts?: No Does the patient have difficulty concentrating, remembering, or making decisions?: No Patient able to express need for assistance with ADLs?: Yes Does the patient have difficulty dressing or bathing?: No Independently performs ADLs?: Yes (appropriate for developmental age) Does the patient have difficulty walking or climbing stairs?: No Weakness of Legs: None Weakness of Arms/Hands: None  Home Assistive Devices/Equipment Home Assistive Devices/Equipment: None    Abuse/Neglect Assessment (Assessment to be complete while patient is alone) Abuse/Neglect Assessment Can Be Completed: Yes Physical Abuse: Denies Verbal Abuse: Denies Sexual Abuse: Denies Exploitation of patient/patient's resources: Denies Self-Neglect: Denies     Regulatory affairs officer (For Healthcare) Does Patient Have a Medical Advance Directive?: No Would patient like information on creating a medical advance directive?: No - Patient declined          Disposition: Per Patriciaann Clan, PA pt is recommended for inpt tx and IVC if the pt refuses to sign VOL consent for treatment. EDP Pollina, Gwenyth Allegra, MD and Maylon Cos, RN have been advised. TTS to seek placement due to no appropriate beds at [facility] per [facility].  Disposition Initial Assessment Completed for this Encounter: Yes Disposition of Patient: Admit Type of inpatient treatment program: Adult Patient refused recommended treatment: No(pt to be IVC'd if he refuses to sign VOL consent)  This service was provided via telemedicine using a 2-way, interactive audio and video technology.  Names of all persons participating in this telemedicine service and their role in this encounter. Name: Devon Hernandez Role: Patient  Name: Lind Covert Role: TTS          Lyanne Co 10/13/2018 5:04 AM

## 2018-10-13 NOTE — ED Notes (Signed)
TTS Computer at bedside.

## 2018-10-13 NOTE — [facility] Suicide Risk Assessment (Cosign Needed)
Suicide Risk Assessment  Discharge Assessment   [facility] Discharge Suicide Risk Assessment   Principal Problem: Major depressive disorder, recurrent episode, mild ([place]) Discharge Diagnoses: Principal Problem:   Major depressive disorder, recurrent episode, mild ([place])   Total Time spent with patient: 30 minutes  Musculoskeletal: Strength & Muscle Tone: within normal limits Gait & Station: normal Patient leans: N/A  Psychiatric Specialty Exam: Physical Exam  ROS  Blood pressure 126/87, pulse 83, temperature 98.1 F (36.7 C), temperature source Oral, resp. rate 16, height 5\' 9"  (1.753 m), weight 55.8 kg, SpO2 95 %.Body mass index is 18.16 kg/m.  General Appearance: Casual  Eye Contact:  Good  Speech:  Clear and Coherent and Normal Rate  Volume:  Normal  Mood:  Appropriate  Affect:  Congruent  Thought Process:  Coherent  Orientation:  Full (Time, Place, and Person)  Thought Content:  WDL  Suicidal Thoughts:  No  Homicidal Thoughts:  No  Memory:  Immediate;   Good Recent;   Good  Judgement:  Intact  Insight:  Present  Psychomotor Activity:  Normal  Concentration:  Concentration: Good  Recall:  Good  Fund of Knowledge:  Fair  Language:  Good  Akathisia:  No  Handed:  Right  AIMS (if indicated):     Assets:  Armed forces logistics/support/administrative officer Housing Leisure Time Social Support  ADL's:  Intact  Cognition:  WNL  Sleep:        Mental Status Per Nursing Assessment::   On Admission:     Demographic Factors:  Male  Loss Factors: NA  Historical Factors: NA  Risk Reduction Factors:   Religious beliefs about death, Living with another person, especially a relative and Positive social support  Continued Clinical Symptoms:  Previous Psychiatric Diagnoses and Treatments  Cognitive Features That Contribute To Risk:  None    Suicide Risk:  Minimal: No identifiable suicidal ideation.  Patients presenting with no risk factors but with morbid ruminations; may be classified as  minimal risk based on the severity of the depressive symptoms    Plan Of Care/Follow-up recommendations:  Activity:  As tolerated Diet:  Heart Healthy Other:  Follow up with Warnell Forester Rankin, NP 10/13/2018, 12:01 PM

## 2018-10-13 NOTE — ED Notes (Signed)
Sitter at bedside.

## 2018-10-13 NOTE — ED Triage Notes (Signed)
Pt reports increased stress over the past few days. He states that tonight, he texted someone saying that he was going to jump off a bridge. His friend called the sheriff.  He now states that that was just a build up of his stress and not how he truly feels. He states that he feels fine now and denies SI/HI.

## 2018-10-13 NOTE — Progress Notes (Signed)
Per Patriciaann Clan, PA pt is recommended for inpt tx and IVC if the pt refuses to sign VOL consent for treatment. EDP Pollina, Gwenyth Allegra, MD and Maylon Cos, RN have been advised. TTS to seek placement due to no appropriate beds at [facility] per [facility].  Lind Covert, MSW, LCSW Therapeutic Triage Specialist  [phone]
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
## 2018-11-21 ENCOUNTER — Other Ambulatory Visit: Payer: Self-pay | Admitting: Internal Medicine

## 2018-11-21 DIAGNOSIS — B20 Human immunodeficiency virus [HIV] disease: Secondary | ICD-10-CM

## 2018-12-28 ENCOUNTER — Other Ambulatory Visit: Payer: Self-pay | Admitting: *Deleted

## 2018-12-28 DIAGNOSIS — B20 Human immunodeficiency virus [HIV] disease: Secondary | ICD-10-CM

## 2018-12-28 DIAGNOSIS — Z113 Encounter for screening for infections with a predominantly sexual mode of transmission: Secondary | ICD-10-CM

## 2018-12-31 ENCOUNTER — Other Ambulatory Visit: Payer: 59

## 2019-01-14 ENCOUNTER — Ambulatory Visit: Payer: 59 | Admitting: Internal Medicine

## 2019-02-12 ENCOUNTER — Other Ambulatory Visit: Payer: Self-pay

## 2019-02-12 ENCOUNTER — Encounter (HOSPITAL_COMMUNITY): Payer: Self-pay | Admitting: Emergency Medicine

## 2019-02-12 ENCOUNTER — Ambulatory Visit (HOSPITAL_COMMUNITY)
Admission: EM | Admit: 2019-02-12 | Discharge: 2019-02-12 | Disposition: A | Discharge status: Home / Self Care | Payer: BC Managed Care – PPO | Attending: Emergency Medicine | Admitting: Emergency Medicine

## 2019-02-12 DIAGNOSIS — R109 Unspecified abdominal pain: Secondary | ICD-10-CM

## 2019-02-12 DIAGNOSIS — M546 Pain in thoracic spine: Secondary | ICD-10-CM

## 2019-02-12 MED ORDER — TIZANIDINE HCL 4 MG PO TABS: 4.0000 mg | ORAL_TABLET | Freq: Three times a day (TID) | ORAL | 0 refills | Status: DC | PRN

## 2019-02-12 MED ORDER — LIDOCAINE 5 % EX PTCH: 1.0000 | MEDICATED_PATCH | CUTANEOUS | 0 refills | Status: DC

## 2019-02-12 NOTE — ED Triage Notes (Signed)
Patient has lower back pain that started after backing into a counter top, 02/10/2019.  Abdomen started hurting yesterday, upper abdomen, all the way across.  No nausea no vomiting

## 2019-02-12 NOTE — ED Provider Notes (Signed)
HPI  SUBJECTIVE:  Devon Hernandez is a 23 y.o. male who presents with nonmigratory, nonradiating lower thoracic, upper lumbar pain after backing up against the edge of a countertop.  States that he hit his spine on the edge of a counter.  He denies fall.  This occurred 2 days ago.  He describes the pain is intermittent, lasting up to 10 to 15 minutes, sharp, stabbing.  No fevers.  No saddle anesthesia, leg weakness, distal numbness or tingling, urinary or fecal incontinence, bruising.  He has tried 1000 mg of Tylenol twice a day with some improvement in his symptoms.  Symptoms are worse with bending forward.  Is not associated with torso rotation.  He also reports some abdominal pain that he attributes to constipation.  He has had abdominal pain like this before.  States that it is getting better after taking 2 stool softeners and having a bowel movement this morning.  He has a past medical history of HIV, polysubstance abuse, bipolar, schizophrenia.  No history of IV drug use, diabetes, hypertension, osteoporosis.  [id number], Wynonia Musty, MD     Past Medical History:  Diagnosis Date  . ADHD (attention deficit hyperactivity disorder)   . Allergy   . Anxiety   . Bipolar disorder ([place])   . Depression   . Headache(784.0)   . HIV (human immunodeficiency virus infection) ([place]) 07/05/2018  . Immune deficiency disorder [facility])     Past Surgical History:  Procedure Laterality Date  . TYMPANOSTOMY TUBE PLACEMENT      Family History  Problem Relation Age of Onset  . Depression Mother   . Hypertension Mother   . Depression Father        Hx: PTSD  . Diabetes Maternal Aunt   . AVM Maternal Grandmother   . Aneurysm Maternal Grandmother   . Seizures Maternal Grandmother     Social History   Tobacco Use  . Smoking status: Current Every Day Smoker    Types: Cigars  . Smokeless tobacco: Never Used  Substance Use Topics  . Alcohol use: Yes    Comment: occasionally  . Drug use: No    Types:  Marijuana    Comment: Hx trying THC and smoking marijuana per record    No current facility-administered medications for this encounter.   Current Outpatient Medications:  .  BIKTARVY 50-200-25 MG TABS tablet, TAKE 1 TABLET BY MOUTH DAILY., Disp: 30 tablet, Rfl: 3 .  lithium carbonate 300 MG capsule, Take 300 mg by mouth 2 (two) times daily with a meal., Disp: , Rfl:  .  mirtazapine (REMERON) 15 MG tablet, Take 15 mg by mouth at bedtime., Disp: , Rfl:  .  risperiDONE (RISPERDAL PO), Take by mouth., Disp: , Rfl:  .  lamoTRIgine (LAMICTAL) 150 MG tablet, Take 150 mg by mouth 2 (two) times daily., Disp: , Rfl:  .  lidocaine (LIDODERM) 5 %, Place 1 patch onto the skin daily. Remove & Discard patch within 12 hours or as directed by MD, Disp: 30 patch, Rfl: 0 .  tiZANidine (ZANAFLEX) 4 MG tablet, Take 1 tablet (4 mg total) by mouth every 8 (eight) hours as needed for muscle spasms., Disp: 30 tablet, Rfl: 0  Allergies  Allergen Reactions  . Amoxicillin Hives and Diarrhea  . Penicillins     Per Dad     ROS  As noted in HPI.   Physical Exam  BP (!) 107/57 (BP Location: Right Arm)   Pulse (!) 53   Temp 98.7  F (37.1 C) (Oral)   Resp 18   SpO2 99%   Constitutional: Well developed, well nourished, no acute distress Eyes:  EOMI, conjunctiva normal bilaterally HENT: Normocephalic, atraumatic,mucus membranes moist Respiratory: Normal inspiratory effort Cardiovascular: Normal rate GI: nondistended, appearance, soft, nontender.  No guarding, rebound. Back: Positive midline tenderness at T12/L1.  No bruising, swelling.  Bilateral paralumbar tenderness, muscle spasm.  No CVAT.  Sensation intact in bilateral bilateral lower extremities, baseline ROM with intact DP pulses, No pain with int/ext rotation flex/extension hips bilaterally. SLR neg bilaterally, DTR's symmetric and intact bilaterally KJ, Motor symmetric bilateral 5/5 hip flexion, quadriceps, hamstrings, EHL, foot dorsiflexion, foot  plantarflexion skin: No rash, skin intact Musculoskeletal: no deformities Neurologic: Alert & oriented x 3, no focal neuro deficits Psychiatric: Speech and behavior appropriate   ED Course   Medications - No data to display  No orders of the defined types were placed in this encounter.   No results found for this or any previous visit (from the past 24 hour(s)). No results found.  ED Clinical Impression  1. Acute midline thoracic back pain      ED Assessment/Plan  Patient attributes the abdominal pain he reported in the triage note to constipation.  States that he feels much better after taking 2 stool softeners and having a bowel movement today.  His abdomen is benign here.  Doubt vertebral fracture, so imaging was deferred today.  And agrees with this.  Sending home with ibuprofen 600 mg combined with 1 g of Tylenol 3-4 times a day as needed for pain, Zanaflex, patient is requiring lidocaine patches.  Will send home with a work note for today.  Follow-up with PMD as needed.  Discussed MDM, treatment plan, with patient.  patient agrees with plan.   Meds ordered this encounter  Medications  . tiZANidine (ZANAFLEX) 4 MG tablet    Sig: Take 1 tablet (4 mg total) by mouth every 8 (eight) hours as needed for muscle spasms.    Dispense:  30 tablet    Refill:  0  . lidocaine (LIDODERM) 5 %    Sig: Place 1 patch onto the skin daily. Remove & Discard patch within 12 hours or as directed by MD    Dispense:  30 patch    Refill:  0    *This clinic note was created using [employer]. Therefore, there may be occasional mistakes despite careful proofreading.   ?    Melynda Ripple, MD 02/13/19 [phone]

## 2019-02-12 NOTE — Discharge Instructions (Addendum)
Unfortunately, I cannot give you any NSAIDs because it interacts with your lithium and Biktarvy.  1 g of Tylenol 3 or 4 times a day as needed for pain.  The lidocaine patches and Zanaflex will also help.  Apply ice packs for the first 72 hours, then heat.

## 2019-03-11 ENCOUNTER — Other Ambulatory Visit: Payer: Self-pay

## 2019-03-11 ENCOUNTER — Ambulatory Visit
Admission: EM | Admit: 2019-03-11 | Discharge: 2019-03-11 | Disposition: A | Discharge status: Home / Self Care | Payer: BC Managed Care – PPO | Attending: Physician Assistant | Admitting: Physician Assistant

## 2019-03-11 DIAGNOSIS — R109 Unspecified abdominal pain: Secondary | ICD-10-CM | POA: Diagnosis not present

## 2019-03-11 MED ORDER — TIZANIDINE HCL 4 MG PO TABS: 4.0000 mg | ORAL_TABLET | Freq: Three times a day (TID) | ORAL | 0 refills | Status: DC | PRN
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
## 2019-03-11 NOTE — Discharge Instructions (Signed)
No alarming signs on exam. Ibuprofen/tylenol as needed for pain. Tizanidine as needed, this can make you drowsy, so do not take if you are going to drive, operate heavy machinery, or make important decisions. Ice/heat compresses as needed. This can take up to 3-4 weeks to completely resolve, but you should be feeling better each week. If having worsening abdominal pain, nausea/vomiting, blood in stool or urine, go to the ED for further evaluation. Otherwise follow up with PCP for reevaluation as needed.
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
## 2019-04-04 ENCOUNTER — Other Ambulatory Visit: Payer: 59

## 2019-04-05 ENCOUNTER — Encounter (HOSPITAL_COMMUNITY): Payer: Self-pay | Admitting: Emergency Medicine

## 2019-04-05 ENCOUNTER — Other Ambulatory Visit: Payer: Self-pay

## 2019-04-05 ENCOUNTER — Ambulatory Visit (HOSPITAL_COMMUNITY)
Admission: EM | Admit: 2019-04-05 | Discharge: 2019-04-05 | Disposition: A | Discharge status: Home / Self Care | Payer: BC Managed Care – PPO | Attending: Emergency Medicine | Admitting: Emergency Medicine

## 2019-04-05 ENCOUNTER — Other Ambulatory Visit: Payer: 59

## 2019-04-05 DIAGNOSIS — R432 Parageusia: Secondary | ICD-10-CM

## 2019-04-05 DIAGNOSIS — Z20828 Contact with and (suspected) exposure to other viral communicable diseases: Secondary | ICD-10-CM

## 2019-04-05 DIAGNOSIS — U071 COVID-19: Secondary | ICD-10-CM | POA: Insufficient documentation

## 2019-04-05 DIAGNOSIS — Z20822 Contact with and (suspected) exposure to covid-19: Secondary | ICD-10-CM

## 2019-04-05 DIAGNOSIS — R112 Nausea with vomiting, unspecified: Secondary | ICD-10-CM | POA: Diagnosis not present

## 2019-04-05 DIAGNOSIS — R430 Anosmia: Secondary | ICD-10-CM | POA: Insufficient documentation

## 2019-04-05 MED ORDER — ONDANSETRON HCL 4 MG PO TABS: 4.0000 mg | ORAL_TABLET | Freq: Three times a day (TID) | ORAL | 0 refills | Status: DC | PRN

## 2019-04-05 NOTE — ED Notes (Signed)
Wishes to speak with provider. Did not ask any other questions.

## 2019-04-05 NOTE — Discharge Instructions (Addendum)

## 2019-04-05 NOTE — ED Triage Notes (Signed)
Pt. States his mom got tested on 11/27 & it came back POSITIVE. His last contact with his mom was 11/28. He has loss all taste & smell, diaherra, vomiting, fatigue since 12/1. Wants COVID testing.

## 2019-04-05 NOTE — ED Provider Notes (Signed)
[place]    CSN: [id number] Arrival date & time: 04/05/19  1042      History   Chief Complaint Chief Complaint  Patient presents with  . COVID EXPOSURE    HPI Devon Hernandez is a 23 y.o. male.   Devon Hernandez 34 y old with Hx of HIV  presents to the urgent care with a complaint of nausea, vomiting, loss of taste and smell, was exposed to his stepdad that tested positive for COVID-19 on 03/29/19. Denies sick exposure to  flu or strep.  Denies recent travel. Denies aggravating or alleviating symptoms. Denies previous COVID infection.   Denies fever, chills, fatigue, nasal congestion, rhinorrhea, sore throat, cough, SOB, wheezing, chest pain,changes in bowel or bladder habits.  Patient stated he is stressed and worried due to been HIV positive. However, he stated he doesn't have a succidal though neither have a plan to harm himself or other  The history is provided by the patient. No language interpreter was used.    Past Medical History:  Diagnosis Date  . ADHD (attention deficit hyperactivity disorder)   . Allergy   . Anxiety   . Bipolar disorder ([place])   . Depression   . Headache(784.0)   . HIV (human immunodeficiency virus infection) ([place]) 07/05/2018  . Immune deficiency disorder [facility])     Patient Active Problem List   Diagnosis Date Noted  . Major depressive disorder, recurrent episode, mild ([place]) 10/13/2018  . Human immunodeficiency virus (HIV) disease ([place]) 07/23/2018  . 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 Perianal wart 03/30/2017  . Polysubstance abuse ([place]) 07/11/2013  . Altered mental status 07/09/2013  . MDD (major depressive disorder), recurrent episode, severe ([place]) 01/18/2013  . ADHD (attention deficit hyperactivity disorder), combined type 01/18/2013  . Conduct disorder, 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    Past Surgical History:  Procedure Laterality Date  . TYMPANOSTOMY TUBE PLACEMENT         Home Medications     Prior to Admission medications   Medication Sig Start Date End Date Taking? Authorizing Provider  BIKTARVY 50-200-25 MG TABS tablet TAKE 1 TABLET BY MOUTH DAILY. 11/21/18   Thayer Headings, MD  lamoTRIgine (LAMICTAL) 150 MG tablet Take 150 mg by mouth 2 (two) times daily.    [provider]  lithium carbonate 300 MG capsule Take 300 mg by mouth 2 (two) times daily with a meal.    [provider]  mirtazapine (REMERON) 15 MG tablet Take 15 mg by mouth at bedtime.    [provider]  risperiDONE (RISPERDAL PO) Take by mouth.    [provider]  tiZANidine (ZANAFLEX) 4 MG tablet Take 1 tablet (4 mg total) by mouth every 8 (eight) hours as needed for muscle spasms. 03/11/19   Tasia Catchings, Amy V, PA-C  dicyclomine (BENTYL) 20 MG tablet Take 1 tablet (20 mg total) by mouth 2 (two) times daily as needed for spasms. Patient not taking: Reported on 07/23/2018 01/18/18 10/11/18  Ward, Ozella Almond, PA-C  ipratropium (ATROVENT) 0.06 % nasal spray Place 2 sprays into both nostrils 4 (four) times daily. Patient not taking: Reported on 07/23/2018 09/09/17 10/11/18  Arturo Morton    Family History Family History  Problem Relation Age of Onset  . Depression Mother   . Hypertension Mother   . Depression Father        Hx: PTSD  . Diabetes Maternal Aunt   . AVM Maternal Grandmother   .  Aneurysm Maternal Grandmother   . Seizures Maternal Grandmother     Social History Social History   Tobacco Use  . Smoking status: Current Every Day Smoker    Types: Cigars  . Smokeless tobacco: Never Used  Substance Use Topics  . Alcohol use: Yes    Comment: occasionally  . Drug use: Yes    Types: Marijuana    Comment: Hx trying THC and smoking marijuana per record     Allergies   Amoxicillin and Penicillins   Review of Systems Review of Systems  Constitutional: Negative.   HENT: Negative.   Respiratory: Negative.   Cardiovascular: Negative.   Gastrointestinal: Positive for  nausea and vomiting.       Loss of taste and smell  ROS ALL other are negatives   Physical Exam Triage Vital Signs ED Triage Vitals  Enc Vitals Group     BP      Pulse      Resp      Temp      Temp src      SpO2      Weight      Height      Head Circumference      Peak Flow      Pain Score      Pain Loc      Pain Edu?      Excl. in [place]?    No data found.  Updated Vital Signs BP 115/83 (BP Location: Left Arm)   Pulse 67   Temp 99.1 F (37.3 C) (Oral)   Resp 17   Wt 130 lb (59 kg)   SpO2 100%   BMI 19.20 kg/m   Visual Acuity Right Eye Distance:   Left Eye Distance:   Bilateral Distance:    Right Eye Near:   Left Eye Near:    Bilateral Near:     Physical Exam Vitals signs and nursing note reviewed.  Constitutional:      General: He is not in acute distress.    Appearance: Normal appearance. He is normal weight. He is not ill-appearing.  HENT:     Head: Normocephalic.     Right Ear: Tympanic membrane, ear canal and external ear normal. There is no impacted cerumen.     Left Ear: Tympanic membrane, ear canal and external ear normal. There is no impacted cerumen.     Mouth/Throat:     Mouth: Mucous membranes are moist.     Pharynx: Oropharynx is clear.  Cardiovascular:     Rate and Rhythm: Normal rate and regular rhythm.     Pulses: Normal pulses.     Heart sounds: Normal heart sounds. No murmur.  Pulmonary:     Effort: Pulmonary effort is normal. No respiratory distress.     Breath sounds: Normal breath sounds.  Chest:     Chest wall: No tenderness.  Abdominal:     General: Abdomen is flat. Bowel sounds are normal.     Palpations: Abdomen is soft.  Neurological:     Mental Status: He is alert and oriented to person, place, and time.      UC Treatments / Results  Labs (all labs ordered are listed, but only abnormal results are displayed) Labs Reviewed  NOVEL CORONAVIRUS, NAA (HOSP ORDER, SEND-OUT TO REF LAB; TAT 18-24 HRS)    EKG    Radiology No results found.  Procedures Procedures (including critical care time)  Medications Ordered in UC Medications - No data to display  Initial Impression / Assessment and Plan / UC Course  I have reviewed the triage vital signs and the nursing notes.  Pertinent labs & imaging results that were available during my care of the patient were reviewed by me and considered in my medical decision making (see chart for details)  Patient is stable at discharge. Will call if COVID-19 test is positive. Patient was advised to follow up with PCP and behavioral health if needed   Final Clinical Impressions(s) / UC Diagnoses   Final diagnoses:  Non-intractable vomiting with nausea, unspecified vomiting type  Loss of taste  Loss of smell  Suspected COVID-19 virus infection     Discharge Instructions     COVID testing ordered.  It will take between 2-7 days for test results.  Someone will contact you regarding abnormal results.    In the meantime: You should remain isolated in your home for 10 days from symptom onset AND greater than 72 hours after symptoms resolution (absence of fever without the use of fever-reducing medication and improvement in respiratory symptoms), whichever is longer Get plenty of rest and push fluids Use medications daily for symptom relief Use OTC medications like ibuprofen or tylenol as needed fever or pain Call or go to the ED if you have any new or worsening symptoms such as fever, worsening cough, shortness of breath, chest tightness, chest pain, turning blue, changes in mental status, etc...    ED Prescriptions    None     PDMP not reviewed this encounter.   Emerson Monte, [place] 04/05/19 1421

## 2019-04-07 LAB — NOVEL CORONAVIRUS, NAA (HOSP ORDER, SEND-OUT TO REF LAB; TAT 18-24 HRS): SARS-CoV-2, NAA: DETECTED — AB

## 2019-04-08 ENCOUNTER — Telehealth (HOSPITAL_COMMUNITY): Payer: Self-pay | Admitting: Emergency Medicine

## 2019-04-08 NOTE — Telephone Encounter (Signed)
Your test for COVID-19 was positive, meaning that you were infected with the novel coronavirus and could give the germ to others.  Please continue isolation at home for at least 10 days since the start of your symptoms. If you do not have symptoms, please isolate at home for 10 days from the day you were tested. Once you complete your 10 day quarantine, you may return to normal activities as long as you've not had a fever for over 24 hours(without taking fever reducing medicine) and your symptoms are improving. Please continue good preventive care measures, including:  frequent hand-washing, avoid touching your face, cover coughs/sneezes, stay out of crowds and keep a 6 foot distance from others.  Go to the nearest hospital emergency room if fever/cough/breathlessness are severe or illness seems like a threat to life.  Patient contacted by phone and made aware of    results. Pt verbalized understanding and had all questions answered. Quarantine ends dec 10th

## 2019-04-19 ENCOUNTER — Telehealth: Payer: Self-pay

## 2019-04-19 NOTE — Telephone Encounter (Signed)
COVID-19 Pre-Screening Questions:  Do you currently have a fever (>100 F), chills or unexplained body aches? NO   Are you currently experiencing new cough, shortness of breath, sore throat, runny nose?NO .  Have you recently travelled outside the state of [place] in the last 14 days? NO .  Have you been in contact with someone that is currently pending confirmation of Covid19 testing or has been confirmed to have the Covid19 virus?  NO  **If the patient answers NO to ALL questions -  advise the patient to please call the clinic before coming to the office should any symptoms develop.     

## 2019-04-22 ENCOUNTER — Encounter: Payer: Self-pay | Admitting: Internal Medicine

## 2019-04-22 ENCOUNTER — Other Ambulatory Visit (HOSPITAL_COMMUNITY)
Admission: RE | Admit: 2019-04-22 | Discharge: 2019-04-22 | Disposition: A | Discharge status: Home / Self Care | Payer: BC Managed Care – PPO | Source: Ambulatory Visit | Attending: Internal Medicine | Admitting: Internal Medicine

## 2019-04-22 ENCOUNTER — Other Ambulatory Visit: Payer: Self-pay

## 2019-04-22 ENCOUNTER — Ambulatory Visit (INDEPENDENT_AMBULATORY_CARE_PROVIDER_SITE_OTHER): Payer: 59 | Admitting: Internal Medicine

## 2019-04-22 VITALS — BP 116/76 | HR 82 | Temp 97.5°F | Wt 139.0 lb

## 2019-04-22 DIAGNOSIS — B20 Human immunodeficiency virus [HIV] disease: Secondary | ICD-10-CM | POA: Insufficient documentation

## 2019-04-22 DIAGNOSIS — Z79899 Other long term (current) drug therapy: Secondary | ICD-10-CM | POA: Insufficient documentation

## 2019-04-22 DIAGNOSIS — Z113 Encounter for screening for infections with a predominantly sexual mode of transmission: Secondary | ICD-10-CM | POA: Diagnosis present

## 2019-04-22 NOTE — Progress Notes (Signed)
   Subjective:    Patient ID: Devon Hernandez, male    DOB: [birth year], 23 y.o.   MRN: [id number]  HPI Here for follow up of HIV He continues on Biktarvy and denies any missed doses.  Recently diagnosed with COVID, mild disease, no fever.  Just loss of smell/taste and fatigue.  Was tested on 12/4.  No new issues otherwise.  + syphilis in March.  Back on mental health medications via [facility] and doing well with that.    Review of Systems  Constitutional: Positive for fatigue. Negative for fever and unexpected weight change.  Gastrointestinal: Negative for diarrhea.  Psychiatric/Behavioral: Negative for dysphoric mood.       Objective:   Physical Exam Constitutional:      Appearance: Normal appearance.  Eyes:     General: No scleral icterus. Skin:    Findings: No rash.  Neurological:     General: No focal deficit present.     Mental Status: He is alert.  Psychiatric:        Mood and Affect: Mood normal.   SH: + tobacco        Assessment & Plan:

## 2019-04-22 NOTE — Assessment & Plan Note (Signed)
Will check the lipid panel today 

## 2019-04-22 NOTE — Assessment & Plan Note (Signed)
He is doing well with this and no issues with the [place].  Will check labs today and rtc 6 months.

## 2019-04-22 NOTE — Assessment & Plan Note (Addendum)
Will screen today Recent treated syphilis.

## 2019-04-23 LAB — URINE CYTOLOGY ANCILLARY ONLY
Chlamydia: NEGATIVE
Comment: NEGATIVE
Comment: NORMAL
Neisseria Gonorrhea: NEGATIVE

## 2019-04-23 LAB — T-HELPER CELL (CD4) - (RCID CLINIC ONLY)
CD4 % Helper T Cell: 38 % (ref 33–65)
CD4 T Cell Abs: 1583 /uL (ref 400–1790)

## 2019-04-23 LAB — CYTOLOGY, (ORAL, ANAL, URETHRAL) ANCILLARY ONLY
Chlamydia: NEGATIVE
Chlamydia: NEGATIVE
Comment: NEGATIVE
Comment: NEGATIVE
Comment: NORMAL
Comment: NORMAL
Neisseria Gonorrhea: NEGATIVE
Neisseria Gonorrhea: NEGATIVE

## 2019-04-24 ENCOUNTER — Other Ambulatory Visit: Payer: Self-pay | Admitting: Internal Medicine

## 2019-04-24 DIAGNOSIS — B20 Human immunodeficiency virus [HIV] disease: Secondary | ICD-10-CM

## 2019-04-30 ENCOUNTER — Telehealth: Payer: Self-pay | Admitting: *Deleted

## 2019-04-30 LAB — CBC WITH DIFFERENTIAL/PLATELET
Absolute Monocytes: 955 cells/uL — ABNORMAL HIGH (ref 200–950)
Basophils Absolute: 56 cells/uL (ref 0–200)
Basophils Relative: 0.5 %
Eosinophils Absolute: 155 cells/uL (ref 15–500)
Eosinophils Relative: 1.4 %
HCT: 41.6 % (ref 38.5–50.0)
Hemoglobin: 14.3 g/dL (ref 13.2–17.1)
Lymphs Abs: 4351 cells/uL — ABNORMAL HIGH (ref 850–3900)
MCH: 31.6 pg (ref 27.0–33.0)
MCHC: 34.4 g/dL (ref 32.0–36.0)
MCV: 92 fL (ref 80.0–100.0)
MPV: 10.2 fL (ref 7.5–12.5)
Monocytes Relative: 8.6 %
Neutro Abs: 5583 cells/uL (ref 1500–7800)
Neutrophils Relative %: 50.3 %
Platelets: 224 10*3/uL (ref 140–400)
RBC: 4.52 10*6/uL (ref 4.20–5.80)
RDW: 12.7 % (ref 11.0–15.0)
Total Lymphocyte: 39.2 %
WBC: 11.1 10*3/uL — ABNORMAL HIGH (ref 3.8–10.8)

## 2019-04-30 LAB — FLUORESCENT TREPONEMAL AB(FTA)-IGG-BLD: Fluorescent Treponemal ABS: REACTIVE — AB

## 2019-04-30 LAB — LIPID PANEL
Cholesterol: 131 mg/dL (ref <200)
HDL: 41 mg/dL (ref >=40)
LDL Cholesterol (Calc): 57 mg/dL (calc)
Non-HDL Cholesterol (Calc): 90 mg/dL (calc) (ref <130)
Total CHOL/HDL Ratio: 3.2 (calc) (ref <5.0)
Triglycerides: 279 mg/dL — ABNORMAL HIGH (ref <150)

## 2019-04-30 LAB — HIV-1 RNA QUANT-NO REFLEX-BLD
HIV 1 RNA Quant: 120 copies/mL — ABNORMAL HIGH
HIV-1 RNA Quant, Log: 2.08 Log copies/mL — ABNORMAL HIGH

## 2019-04-30 LAB — COMPLETE METABOLIC PANEL WITH GFR
AG Ratio: 2 (calc) (ref 1.0–2.5)
ALT: 7 U/L — ABNORMAL LOW (ref 9–46)
AST: 16 U/L (ref 10–40)
Albumin: 4.5 g/dL (ref 3.6–5.1)
Alkaline phosphatase (APISO): 58 U/L (ref 36–130)
BUN: 15 mg/dL (ref 7–25)
CO2: 26 mmol/L (ref 20–32)
Calcium: 9.6 mg/dL (ref 8.6–10.3)
Chloride: 106 mmol/L (ref 98–110)
Creat: 1.12 mg/dL (ref 0.60–1.35)
GFR, Est African American: 107 mL/min/{1.73_m2} (ref >=60)
GFR, Est Non African American: 93 mL/min/{1.73_m2} (ref >=60)
Globulin: 2.3 g/dL (calc) (ref 1.9–3.7)
Glucose, Bld: 86 mg/dL (ref 65–99)
Potassium: 4.2 mmol/L (ref 3.5–5.3)
Sodium: 141 mmol/L (ref 135–146)
Total Bilirubin: 0.5 mg/dL (ref 0.2–1.2)
Total Protein: 6.8 g/dL (ref 6.1–8.1)

## 2019-04-30 LAB — RPR TITER: RPR Titer: 1:8 {titer} — ABNORMAL HIGH

## 2019-04-30 LAB — RPR: RPR Ser Ql: REACTIVE — AB
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
## 2019-05-04 NOTE — Telephone Encounter (Signed)
1:8 is an improvement from previous and c/w successful treatment.

## 2019-05-06 NOTE — Telephone Encounter (Signed)
Relayed to patient. Thanks! 

## 2019-06-26 ENCOUNTER — Other Ambulatory Visit: Payer: Self-pay

## 2019-06-26 ENCOUNTER — Encounter: Payer: Self-pay | Admitting: Emergency Medicine

## 2019-06-26 ENCOUNTER — Ambulatory Visit
Admission: EM | Admit: 2019-06-26 | Discharge: 2019-06-26 | Disposition: A | Discharge status: Home / Self Care | Payer: BC Managed Care – PPO

## 2019-06-26 DIAGNOSIS — R1033 Periumbilical pain: Secondary | ICD-10-CM | POA: Diagnosis not present

## 2019-06-26 NOTE — Discharge Instructions (Signed)
No alarming signs on exam. Continue to monitor. If sudden worsening of abdominal pain, with return of nausea/vomiting, go to the emergency department for further evalution.
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
## 2019-06-30 ENCOUNTER — Encounter: Payer: Self-pay | Admitting: Emergency Medicine

## 2019-06-30 ENCOUNTER — Other Ambulatory Visit: Payer: Self-pay

## 2019-06-30 ENCOUNTER — Ambulatory Visit
Admission: EM | Admit: 2019-06-30 | Discharge: 2019-06-30 | Disposition: A | Discharge status: Home / Self Care | Payer: BC Managed Care – PPO

## 2019-06-30 DIAGNOSIS — S3991XD Unspecified injury of abdomen, subsequent encounter: Secondary | ICD-10-CM

## 2019-06-30 DIAGNOSIS — R1012 Left upper quadrant pain: Secondary | ICD-10-CM | POA: Diagnosis not present

## 2019-06-30 DIAGNOSIS — R1013 Epigastric pain: Secondary | ICD-10-CM | POA: Diagnosis not present

## 2019-06-30 NOTE — ED Triage Notes (Addendum)
Pt presents to [facility] for assessment of strong waves of pain continued after his visit a few days ago.  Patient states it has been intermittent in nature, and he will get sharp pains that last approx 60 seconds or less.  Patient states he had a very large bowel movement today and the pain became very intense, and now has remained constant.

## 2019-06-30 NOTE — Discharge Instructions (Addendum)
Recommend you go to ER for further evaluation 
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
## 2019-09-23 ENCOUNTER — Other Ambulatory Visit: Payer: Self-pay | Admitting: Internal Medicine

## 2019-09-23 DIAGNOSIS — B20 Human immunodeficiency virus [HIV] disease: Secondary | ICD-10-CM

## 2019-10-01 ENCOUNTER — Other Ambulatory Visit: Payer: 59

## 2019-10-04 ENCOUNTER — Ambulatory Visit
Admission: EM | Admit: 2019-10-04 | Discharge: 2019-10-04 | Disposition: A | Discharge status: Home / Self Care | Payer: BC Managed Care – PPO

## 2019-10-04 DIAGNOSIS — K610 Anal abscess: Secondary | ICD-10-CM

## 2019-10-04 MED ORDER — AMOXICILLIN-POT CLAVULANATE 500-125 MG PO TABS: 1.0000 | ORAL_TABLET | Freq: Three times a day (TID) | ORAL | 0 refills | Status: DC

## 2019-10-04 MED ORDER — LIDOCAINE VISCOUS HCL 2 % MT SOLN: 15.0000 mL | OROMUCOSAL | 0 refills | Status: DC | PRN

## 2019-10-04 NOTE — ED Provider Notes (Signed)
[name]    CSN: [id number] Arrival date & time: 10/04/19  1427      History   Chief Complaint Chief Complaint  Patient presents with  . Recurrent Skin Infections    HPI Devon Hernandez is a 24 y.o. male with history of HIV, bipolar disorder, allergies, ADHD presenting for recurrent perianal abscess.  States he noticed pain and mild swelling yesterday.  Denies fluctuance, discharge, painful bowel movements.  No trauma to area.  No blood in stool or on toilet paper.  Denies fever, abdominal pain, back pain.  States this is now the fourth time this is happened.  Patient last seen for rectal abscess 10/11/2018: Underwent I&D and completed course of Levaquin and metronidazole.  States he tolerated this regimen well.  Was instructed to follow-up with [place] surgery: Has not done so.  Past Medical History:  Diagnosis Date  . ADHD (attention deficit hyperactivity disorder)   . Allergy   . Anxiety   . Bipolar disorder ([place])   . Depression   . Headache(784.0)   . HIV (human immunodeficiency virus infection) ([place]) 07/05/2018  . Immune deficiency disorder [facility])     Patient Active Problem List   Diagnosis Date Noted  . Routine screening for STI (sexually transmitted infection) 04/22/2019  . Encounter for long-term (current) use of high-risk medication 04/22/2019  . Major depressive disorder, recurrent episode, mild ([place]) 10/13/2018  . Human immunodeficiency virus (HIV) disease ([place]) 07/23/2018  . 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 Perianal wart 03/30/2017  . Polysubstance abuse ([place]) 07/11/2013  . Altered mental status 07/09/2013  . MDD (major depressive disorder), recurrent episode, severe ([place]) 01/18/2013  . ADHD (attention deficit hyperactivity disorder), combined type 01/18/2013  . Conduct disorder, 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    Past Surgical History:  Procedure Laterality Date  . TYMPANOSTOMY TUBE PLACEMENT         Home  Medications    Prior to Admission medications   Medication Sig Start Date End Date Taking? Authorizing Provider  hydrOXYzine (ATARAX/VISTARIL) 25 MG tablet Take 25 mg by mouth 3 (three) times daily as needed.   Yes [provider]  amoxicillin-clavulanate (AUGMENTIN) 500-125 MG tablet Take 1 tablet (500 mg total) by mouth every 8 (eight) hours. 10/04/19   Hall-Potvin, [name], PA-C  BIKTARVY 50-200-25 MG TABS tablet TAKE 1 TABLET BY MOUTH DAILY. 09/23/19   Thayer Headings, MD  lamoTRIgine (LAMICTAL) 150 MG tablet Take 150 mg by mouth 2 (two) times daily.    [provider]  lidocaine (XYLOCAINE) 2 % solution Use as directed 15 mLs in the mouth or throat as needed for mouth pain. 10/04/19   Hall-Potvin, [name], PA-C  lithium carbonate 300 MG capsule Take 300 mg by mouth 2 (two) times daily with a meal.    [provider]  mirtazapine (REMERON) 15 MG tablet Take 15 mg by mouth at bedtime.    [provider]  risperiDONE (RISPERDAL PO) Take by mouth.    [provider]  dicyclomine (BENTYL) 20 MG tablet Take 1 tablet (20 mg total) by mouth 2 (two) times daily as needed for spasms. Patient not taking: Reported on 07/23/2018 01/18/18 10/11/18  Ward, Ozella Almond, PA-C  ipratropium (ATROVENT) 0.06 % nasal spray Place 2 sprays into both nostrils 4 (four) times daily. Patient not taking: Reported on 07/23/2018 09/09/17 10/11/18  Arturo Morton    Family History Family History  Problem Relation Age of Onset  .  Depression Mother   . Hypertension Mother   . Depression Father        Hx: PTSD  . Diabetes Maternal Aunt   . AVM Maternal Grandmother   . Aneurysm Maternal Grandmother   . Seizures Maternal Grandmother     Social History Social History   Tobacco Use  . Smoking status: Current Every Day Smoker    Types: Cigars  . Smokeless tobacco: Never Used  . Tobacco comment: Down to 1 a week  Substance Use Topics  . Alcohol use: Yes    Comment:  occasionally  . Drug use: Yes    Types: Marijuana    Comment: Hx trying THC and smoking marijuana per record     Allergies   Amoxicillin and Penicillins   Review of Systems As per HPI   Physical Exam Triage Vital Signs ED Triage Vitals [10/04/19 1436]  Enc Vitals Group     BP 132/78     Pulse Rate 85     Resp 16     Temp 98.3 F (36.8 C)     Temp Source Oral     SpO2 97 %     Weight      Height      Head Circumference      Peak Flow      Pain Score 3     Pain Loc      Pain Edu?      Excl. in [place]?    No data found.  Updated Vital Signs BP 132/78 (BP Location: Left Arm)   Pulse 85   Temp 98.3 F (36.8 C) (Oral)   Resp 16   SpO2 97%   Visual Acuity Right Eye Distance:   Left Eye Distance:   Bilateral Distance:    Right Eye Near:   Left Eye Near:    Bilateral Near:     Physical Exam Constitutional:      General: He is not in acute distress. HENT:     Head: Normocephalic and atraumatic.  Eyes:     General: No scleral icterus.    Pupils: Pupils are equal, round, and reactive to light.  Cardiovascular:     Rate and Rhythm: Normal rate.  Pulmonary:     Effort: Pulmonary effort is normal. No respiratory distress.     Breath sounds: No wheezing.  Genitourinary:    Comments: No external hemorrhoids, fissures, fistulas noted.  Patient does have left anterolateral anal abscess, approximately 1 cm in diameter, firm.  No fluctuance, opening, discharge. Skin:    Coloration: Skin is not jaundiced or pale.  Neurological:     Mental Status: He is alert and oriented to person, place, and time.      UC Treatments / Results  Labs (all labs ordered are listed, but only abnormal results are displayed) Labs Reviewed - No data to display  EKG   Radiology No results found.  Procedures Procedures (including critical care time)  Medications Ordered in UC Medications - No data to display  Initial Impression / Assessment and Plan / UC Course  I have  reviewed the triage vital signs and the nursing notes.  Pertinent labs & imaging results that were available during my care of the patient were reviewed by me and considered in my medical decision making (see chart for details).     Patient afebrile, nontoxic in office today.  Abscess x2 days without fluctuance.  Given location, induration, will avoid I&D at this time.  Discussed antibiotic regimen  with mother and patient: Augmentin VS ciprofloxacin plus metronidazole.  Mother denies true allergy to penicillin, amoxicillin.  Patient willing to try Augmentin, will take Benadryl and stop medication if any ADRs occur.  Would consider ciprofloxacin and metronidazole x1 week at that time.  Stressed importance of follow-up with surgery to evaluate for possible underlying fistula given recurrence.  Return precautions discussed, patient verbalized understanding and is agreeable to plan. Final Clinical Impressions(s) / UC Diagnoses   Final diagnoses:  Perianal abscess     Discharge Instructions     Keep area(s) clean and dry. Apply hot compress / towel for 5-10 minutes 3-5 times daily.  May also try sitz bath. Take antibiotic as prescribed with food - important to complete course. Return for worsening pain, redness, swelling, discharge, fever.    ED Prescriptions    Medication Sig Dispense Auth. Provider   amoxicillin-clavulanate (AUGMENTIN) 500-125 MG tablet Take 1 tablet (500 mg total) by mouth every 8 (eight) hours. 21 tablet Hall-Potvin, [name], PA-C   lidocaine (XYLOCAINE) 2 % solution Use as directed 15 mLs in the mouth or throat as needed for mouth pain. 100 mL Hall-Potvin, [name], PA-C     PDMP not reviewed this encounter.   Hall-Potvin, [name], [place] 10/04/19 1509

## 2019-10-04 NOTE — Discharge Instructions (Signed)
Keep area(s) clean and dry. Apply hot compress / towel for 5-10 minutes 3-5 times daily.  May also try sitz bath. Take antibiotic as prescribed with food - important to complete course. Return for worsening pain, redness, swelling, discharge, fever.
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
## 2019-10-16 ENCOUNTER — Encounter: Payer: 59 | Admitting: Internal Medicine

## 2019-10-18 ENCOUNTER — Other Ambulatory Visit: Payer: Self-pay

## 2019-10-18 ENCOUNTER — Ambulatory Visit (INDEPENDENT_AMBULATORY_CARE_PROVIDER_SITE_OTHER): Payer: BC Managed Care – PPO

## 2019-10-18 ENCOUNTER — Encounter: Payer: Self-pay | Admitting: Emergency Medicine

## 2019-10-18 ENCOUNTER — Ambulatory Visit
Admission: EM | Admit: 2019-10-18 | Discharge: 2019-10-18 | Disposition: A | Discharge status: Home / Self Care | Payer: BC Managed Care – PPO | Attending: Emergency Medicine | Admitting: Emergency Medicine

## 2019-10-18 DIAGNOSIS — R059 Cough, unspecified: Secondary | ICD-10-CM

## 2019-10-18 DIAGNOSIS — B20 Human immunodeficiency virus [HIV] disease: Secondary | ICD-10-CM | POA: Diagnosis not present

## 2019-10-18 DIAGNOSIS — Z20828 Contact with and (suspected) exposure to other viral communicable diseases: Secondary | ICD-10-CM | POA: Diagnosis not present

## 2019-10-18 DIAGNOSIS — R05 Cough: Secondary | ICD-10-CM | POA: Diagnosis not present

## 2019-10-18 LAB — POC SARS CORONAVIRUS 2 AG -  ED: SARS Coronavirus 2 Ag: NEGATIVE

## 2019-10-18 MED ORDER — BENZONATATE 100 MG PO CAPS: 100.0000 mg | ORAL_CAPSULE | Freq: Three times a day (TID) | ORAL | 0 refills | Status: DC

## 2019-10-18 MED ORDER — ALBUTEROL SULFATE HFA 108 (90 BASE) MCG/ACT IN AERS: 2.0000 | INHALATION_SPRAY | RESPIRATORY_TRACT | 0 refills | Status: DC | PRN

## 2019-10-18 MED ORDER — AEROCHAMBER PLUS FLO-VU MEDIUM MISC: 1.0000 | Freq: Once | 0 refills | Status: AC

## 2019-10-18 MED ORDER — DOXYCYCLINE HYCLATE 100 MG PO CAPS: 100.0000 mg | ORAL_CAPSULE | Freq: Two times a day (BID) | ORAL | 0 refills | Status: AC

## 2019-10-18 NOTE — Discharge Instructions (Signed)
Antibiotic twice daily with food x 5 days.  Tessalon for cough. Start flonase, atrovent nasal spray for nasal congestion/drainage. You can use over the counter nasal saline rinse such as neti pot for nasal congestion. Keep hydrated, your urine should be clear to pale yellow in color. Tylenol/motrin for fever and pain. Monitor for any worsening of symptoms, chest pain, shortness of breath, wheezing, swelling of the throat, go to the emergency department for further evaluation needed.
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
## 2019-10-20 LAB — NOVEL CORONAVIRUS, NAA: SARS-CoV-2, NAA: NOT DETECTED

## 2019-10-20 LAB — SARS-COV-2, NAA 2 DAY TAT

## 2019-10-23 ENCOUNTER — Encounter: Payer: BC Managed Care – PPO | Admitting: Internal Medicine

## 2019-12-25 ENCOUNTER — Other Ambulatory Visit: Payer: Self-pay | Admitting: Internal Medicine

## 2019-12-25 DIAGNOSIS — B20 Human immunodeficiency virus [HIV] disease: Secondary | ICD-10-CM

## 2020-01-15 ENCOUNTER — Other Ambulatory Visit: Payer: Self-pay | Admitting: Internal Medicine

## 2020-01-15 DIAGNOSIS — B20 Human immunodeficiency virus [HIV] disease: Secondary | ICD-10-CM

## 2020-02-05 ENCOUNTER — Other Ambulatory Visit: Payer: Self-pay

## 2020-02-05 DIAGNOSIS — Z113 Encounter for screening for infections with a predominantly sexual mode of transmission: Secondary | ICD-10-CM

## 2020-02-05 DIAGNOSIS — Z79899 Other long term (current) drug therapy: Secondary | ICD-10-CM

## 2020-02-05 DIAGNOSIS — B20 Human immunodeficiency virus [HIV] disease: Secondary | ICD-10-CM

## 2020-02-12 ENCOUNTER — Ambulatory Visit (INDEPENDENT_AMBULATORY_CARE_PROVIDER_SITE_OTHER): Payer: 59 | Admitting: Internal Medicine

## 2020-02-12 ENCOUNTER — Encounter: Payer: Self-pay | Admitting: Internal Medicine

## 2020-02-12 ENCOUNTER — Other Ambulatory Visit: Payer: 59

## 2020-02-12 ENCOUNTER — Other Ambulatory Visit: Payer: Self-pay

## 2020-02-12 ENCOUNTER — Other Ambulatory Visit (HOSPITAL_COMMUNITY)
Admission: RE | Admit: 2020-02-12 | Discharge: 2020-02-12 | Disposition: A | Discharge status: Home / Self Care | Payer: 59 | Source: Ambulatory Visit | Attending: Internal Medicine | Admitting: Internal Medicine

## 2020-02-12 VITALS — BP 142/86 | HR 69 | Temp 98.4°F | Ht 70.0 in

## 2020-02-12 DIAGNOSIS — Z113 Encounter for screening for infections with a predominantly sexual mode of transmission: Secondary | ICD-10-CM | POA: Diagnosis not present

## 2020-02-12 DIAGNOSIS — Z79899 Other long term (current) drug therapy: Secondary | ICD-10-CM

## 2020-02-12 DIAGNOSIS — B20 Human immunodeficiency virus [HIV] disease: Secondary | ICD-10-CM | POA: Diagnosis not present

## 2020-02-12 NOTE — Assessment & Plan Note (Signed)
Will screen today 

## 2020-02-12 NOTE — Assessment & Plan Note (Addendum)
He appears to be doing well with this and will confirm with labs today.  He will return in 6 months unless concerns.  He will come back and get the flu shot and COVID vaccine here this week together.

## 2020-02-12 NOTE — Progress Notes (Signed)
   Subjective:    Patient ID: Devon Hernandez, male    DOB: 10/09/1995, 24 y.o.   MRN: [id number]  HPI Here for follow up of HIV He continues on Biktarvy and denies any missed doses, despite sporadic follow up.  No labs prior to the visit.  Last CD4 was 1583 and viral load was 120.  He continues working two jobs, no longer at [employer].  He is interested in the hepatitis B vaccine study and met with the study coordinator.  He is in counseling and is intermittent with his Remeron and other medications.  He has not had the COVID or flu vaccine yet.    Review of Systems  Gastrointestinal: Negative for diarrhea and nausea.  Skin: Negative for rash.       Objective:   Physical Exam Constitutional:      Appearance: Normal appearance.  Eyes:     General: No scleral icterus. Pulmonary:     Effort: Pulmonary effort is normal.  Neurological:     General: No focal deficit present.     Mental Status: He is alert.  Psychiatric:        Mood and Affect: Mood normal.   SH: + tobacco        Assessment & Plan:

## 2020-02-12 NOTE — Assessment & Plan Note (Signed)
Lipid panel today

## 2020-02-13 ENCOUNTER — Other Ambulatory Visit: Payer: Self-pay | Admitting: Internal Medicine

## 2020-02-13 ENCOUNTER — Ambulatory Visit: Payer: 59

## 2020-02-13 ENCOUNTER — Telehealth: Payer: Self-pay | Admitting: *Deleted

## 2020-02-13 LAB — CYTOLOGY, (ORAL, ANAL, URETHRAL) ANCILLARY ONLY
Chlamydia: NEGATIVE
Chlamydia: POSITIVE — AB
Comment: NEGATIVE
Comment: NEGATIVE
Comment: NORMAL
Comment: NORMAL
Neisseria Gonorrhea: NEGATIVE
Neisseria Gonorrhea: NEGATIVE

## 2020-02-13 LAB — URINE CYTOLOGY ANCILLARY ONLY
Chlamydia: NEGATIVE
Comment: NEGATIVE
Comment: NORMAL
Neisseria Gonorrhea: NEGATIVE

## 2020-02-13 LAB — T-HELPER CELL (CD4) - (RCID CLINIC ONLY)
CD4 % Helper T Cell: 38 % (ref 33–65)
CD4 T Cell Abs: 1250 /uL (ref 400–1790)

## 2020-02-13 MED ORDER — AZITHROMYCIN 500 MG PO TABS: 1000.0000 mg | ORAL_TABLET | Freq: Once | ORAL | 0 refills | Status: AC

## 2020-02-13 NOTE — Telephone Encounter (Signed)
Relayed to patient. He will pick up the medication today. Landis Gandy, RN

## 2020-02-13 NOTE — Telephone Encounter (Signed)
-----   Message from Thayer Headings, MD sent at 02/13/2020 11:45 AM EDT ----- He is positive for chlamydia and I have sent in azithromycin to his pharmacy. thanks

## 2020-02-14 ENCOUNTER — Ambulatory Visit: Payer: 59

## 2020-02-15 LAB — CBC WITH DIFFERENTIAL/PLATELET
Absolute Monocytes: 664 cells/uL (ref 200–950)
Basophils Absolute: 49 cells/uL (ref 0–200)
Basophils Relative: 0.6 %
Eosinophils Absolute: 130 cells/uL (ref 15–500)
Eosinophils Relative: 1.6 %
HCT: 45.2 % (ref 38.5–50.0)
Hemoglobin: 15.7 g/dL (ref 13.2–17.1)
Lymphs Abs: 3726 cells/uL (ref 850–3900)
MCH: 31.8 pg (ref 27.0–33.0)
MCHC: 34.7 g/dL (ref 32.0–36.0)
MCV: 91.5 fL (ref 80.0–100.0)
MPV: 10.3 fL (ref 7.5–12.5)
Monocytes Relative: 8.2 %
Neutro Abs: 3532 cells/uL (ref 1500–7800)
Neutrophils Relative %: 43.6 %
Platelets: 257 10*3/uL (ref 140–400)
RBC: 4.94 10*6/uL (ref 4.20–5.80)
RDW: 12.9 % (ref 11.0–15.0)
Total Lymphocyte: 46 %
WBC: 8.1 10*3/uL (ref 3.8–10.8)

## 2020-02-15 LAB — RPR TITER: RPR Titer: 1:4 {titer} — ABNORMAL HIGH

## 2020-02-15 LAB — COMPLETE METABOLIC PANEL WITH GFR
AG Ratio: 2.1 (calc) (ref 1.0–2.5)
ALT: 11 U/L (ref 9–46)
AST: 22 U/L (ref 10–40)
Albumin: 5.2 g/dL — ABNORMAL HIGH (ref 3.6–5.1)
Alkaline phosphatase (APISO): 64 U/L (ref 36–130)
BUN: 9 mg/dL (ref 7–25)
CO2: 28 mmol/L (ref 20–32)
Calcium: 9.9 mg/dL (ref 8.6–10.3)
Chloride: 105 mmol/L (ref 98–110)
Creat: 1.1 mg/dL (ref 0.60–1.35)
GFR, Est African American: 109 mL/min/{1.73_m2} (ref >=60)
GFR, Est Non African American: 94 mL/min/{1.73_m2} (ref >=60)
Globulin: 2.5 g/dL (calc) (ref 1.9–3.7)
Glucose, Bld: 97 mg/dL (ref 65–99)
Potassium: 3.8 mmol/L (ref 3.5–5.3)
Sodium: 140 mmol/L (ref 135–146)
Total Bilirubin: 1.4 mg/dL — ABNORMAL HIGH (ref 0.2–1.2)
Total Protein: 7.7 g/dL (ref 6.1–8.1)

## 2020-02-15 LAB — LIPID PANEL
Cholesterol: 149 mg/dL (ref <200)
HDL: 45 mg/dL (ref >=40)
LDL Cholesterol (Calc): 88 mg/dL (calc)
Non-HDL Cholesterol (Calc): 104 mg/dL (calc) (ref <130)
Total CHOL/HDL Ratio: 3.3 (calc) (ref <5.0)
Triglycerides: 70 mg/dL (ref <150)

## 2020-02-15 LAB — HIV-1 RNA QUANT-NO REFLEX-BLD
HIV 1 RNA Quant: <20 Copies/mL
HIV-1 RNA Quant, Log: <1.3 Log cps/mL

## 2020-02-15 LAB — FLUORESCENT TREPONEMAL AB(FTA)-IGG-BLD: Fluorescent Treponemal ABS: REACTIVE — AB

## 2020-02-15 LAB — RPR: RPR Ser Ql: REACTIVE — AB

## 2020-02-17 ENCOUNTER — Other Ambulatory Visit: Payer: Self-pay

## 2020-02-17 ENCOUNTER — Encounter (INDEPENDENT_AMBULATORY_CARE_PROVIDER_SITE_OTHER): Payer: Self-pay | Admitting: *Deleted

## 2020-02-17 VITALS — BP 119/75 | HR 75 | Temp 98.6°F | Ht 70.0 in | Wt 127.2 lb

## 2020-02-17 DIAGNOSIS — Z006 Encounter for examination for normal comparison and control in clinical research program: Secondary | ICD-10-CM

## 2020-02-17 NOTE — Research (Signed)
Webber came in today to screen for the [employer] study. He had taken the consent home and reviewed it before coming in. He understands the study and is willing to be consented. Informed consent was obtained after we discussed the study in detail and answered all his questions. He agrees to the optional next day blood draw if he is randomized to that arm of the study. He has a history of ADHD, Bipolar depression. He said he was admitted to [facility] in [facility] for mental health and had been on treatment, but has not needed any in a long time. Denies active depression or other problems currently. The only medication he takes regularly is Biktarvy and an occassional tylenol, ibuprofen or claritin for allergies. He has documented evidence of being immunized for Hep B as a young child and tested negative for the antibody last year.  He knows he needs to get vaccinated for Covid and flu, but is willing to wait until after he enrolls on study. He is returning tomorrow for an exam with Claiborne Billings.

## 2020-02-18 ENCOUNTER — Encounter: Payer: 59 | Admitting: Physician Assistant

## 2020-02-18 LAB — CK: Total CK: 118 U/L (ref 44–196)

## 2020-02-18 LAB — BILIRUBIN, DIRECT: Bilirubin, Direct: 0.4 mg/dL — ABNORMAL HIGH (ref 0.0–0.2)

## 2020-02-18 LAB — COMPREHENSIVE METABOLIC PANEL
AG Ratio: 1.9 (calc) (ref 1.0–2.5)
ALT: 9 U/L (ref 9–46)
AST: 19 U/L (ref 10–40)
Albumin: 5.2 g/dL — ABNORMAL HIGH (ref 3.6–5.1)
Alkaline phosphatase (APISO): 60 U/L (ref 36–130)
BUN: 8 mg/dL (ref 7–25)
CO2: 26 mmol/L (ref 20–32)
Calcium: 10.3 mg/dL (ref 8.6–10.3)
Chloride: 103 mmol/L (ref 98–110)
Creat: 1.04 mg/dL (ref 0.60–1.35)
Globulin: 2.8 g/dL (calc) (ref 1.9–3.7)
Glucose, Bld: 93 mg/dL (ref 65–99)
Potassium: 4 mmol/L (ref 3.5–5.3)
Sodium: 138 mmol/L (ref 135–146)
Total Bilirubin: 2.1 mg/dL — ABNORMAL HIGH (ref 0.2–1.2)
Total Protein: 8 g/dL (ref 6.1–8.1)

## 2020-02-18 LAB — PROTIME-INR
INR: 1.1
Prothrombin Time: 12.2 s — ABNORMAL HIGH (ref 9.0–11.5)

## 2020-02-18 LAB — HEPATITIS B CORE ANTIBODY, TOTAL: Hep B Core Total Ab: NONREACTIVE

## 2020-02-18 LAB — HEPATITIS B SURFACE ANTIGEN: Hepatitis B Surface Ag: NONREACTIVE

## 2020-02-18 LAB — HEPATITIS B SURFACE ANTIBODY,QUALITATIVE: Hep B S Ab: NONREACTIVE

## 2020-02-18 LAB — PHOSPHORUS: Phosphorus: 3.1 mg/dL (ref 2.5–4.5)

## 2020-02-26 ENCOUNTER — Other Ambulatory Visit: Payer: Self-pay | Admitting: Internal Medicine

## 2020-02-26 DIAGNOSIS — B20 Human immunodeficiency virus [HIV] disease: Secondary | ICD-10-CM

## 2020-03-04 ENCOUNTER — Encounter: Payer: Self-pay | Admitting: *Deleted

## 2020-03-05 ENCOUNTER — Telehealth: Payer: Self-pay

## 2020-03-05 ENCOUNTER — Encounter: Payer: Self-pay | Admitting: *Deleted

## 2020-03-05 ENCOUNTER — Ambulatory Visit: Payer: 59

## 2020-03-05 NOTE — Telephone Encounter (Signed)
[facility] Patient Teacher, English as a foreign language completed.    The patient is insured through [employer].   [facility].  If patient need co-pay card I can supply one to make the co-pay $0.00.  We will continue to follow to see if copay assistance is needed.  Ileene Patrick, [facility] Patient [facility] Phone: [phone] Fax:  [phone]

## 2020-03-06 ENCOUNTER — Encounter: Payer: Self-pay | Admitting: *Deleted

## 2020-04-01 IMAGING — CT CT PELVIS W/ CM
2 of 3 series · 17 of 46 positions shown, 19 images · IV contrast (iopamidol)
Comparison: CT abdomen dated 01/18/2018.

CLINICAL DATA: Rectal pain last night, recent bouts of constipation
since last week. Bleeding.

EXAM:
CT PELVIS WITH CONTRAST
TECHNIQUE: Multidetector CT imaging of the pelvis was performed using the
standard protocol following the bolus administration of intravenous
contrast.
CONTRAST:  100mL [id number]-[id number] IOPAMIDOL ([id number]-RYY) INJECTION 61%

[Series 2: axial st · axial · 0.72mm/px · z∈[-841,-616]mm · 14 of 53 slices shown, 16 images]
[im 4/53  soft-tissue]
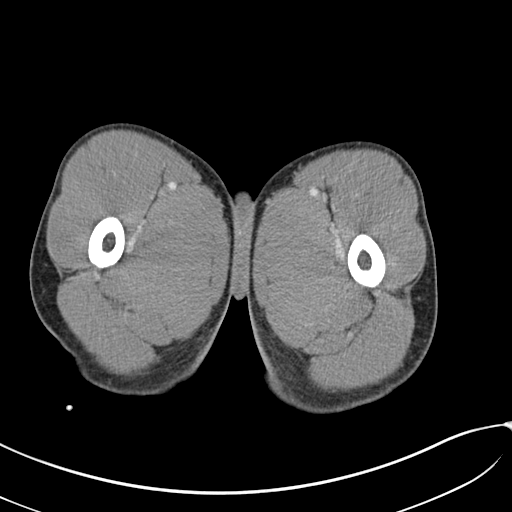
[im 4/53  bone]
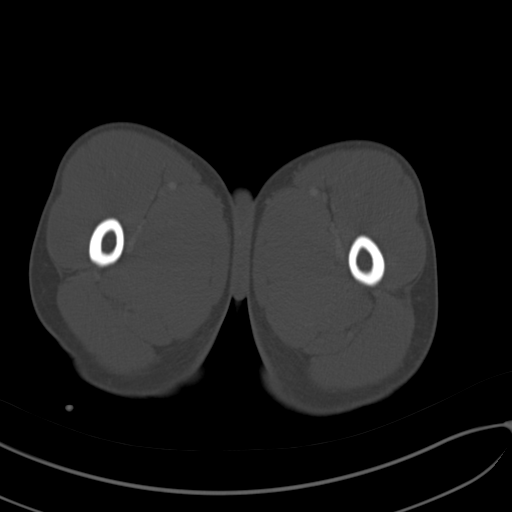
[im 7/53  soft-tissue]
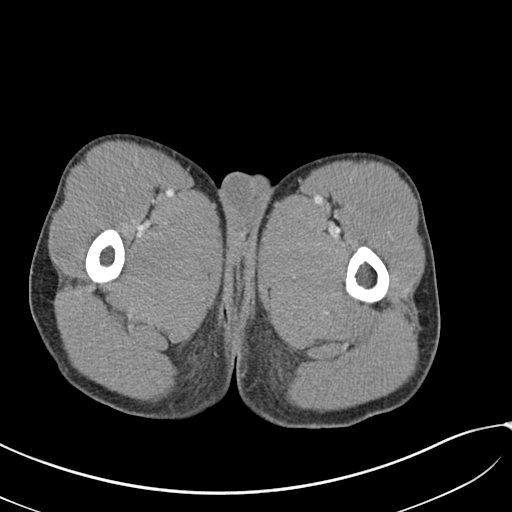
[im 11/53  soft-tissue]
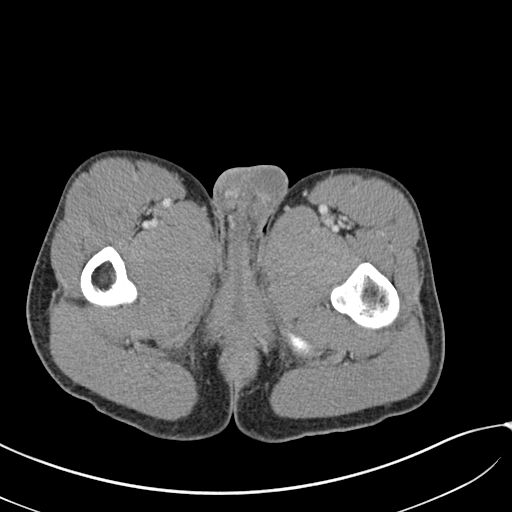
[im 14/53  soft-tissue]
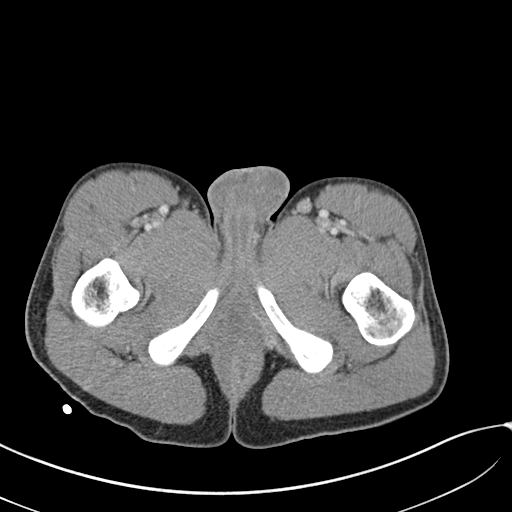
[im 17/53  soft-tissue]
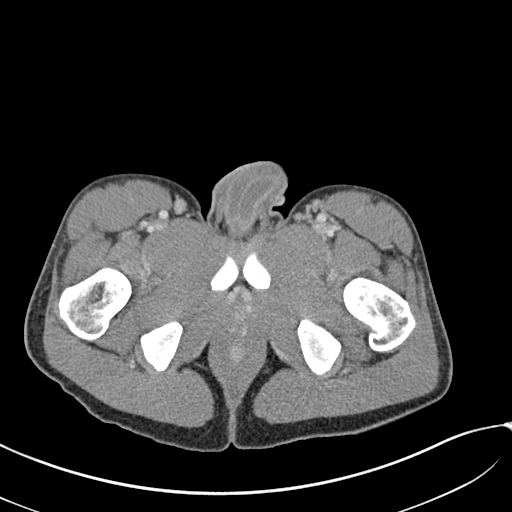
[im 21/53  soft-tissue]
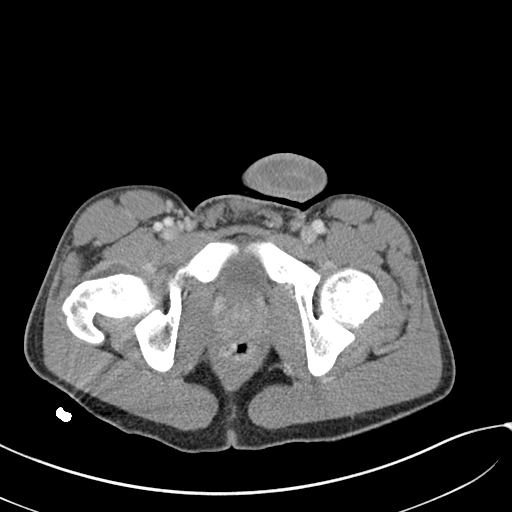
[im 24/53  soft-tissue]
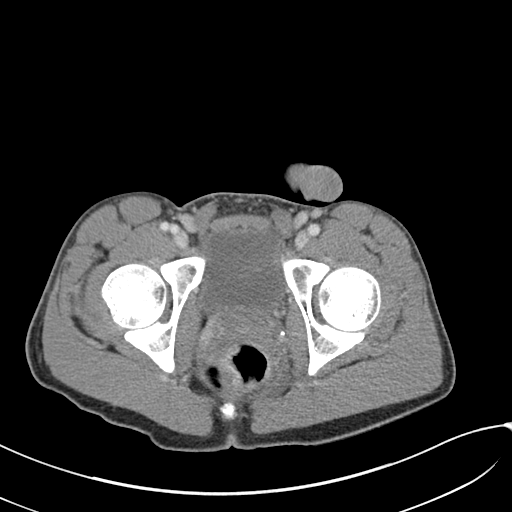
[im 29/53  soft-tissue]
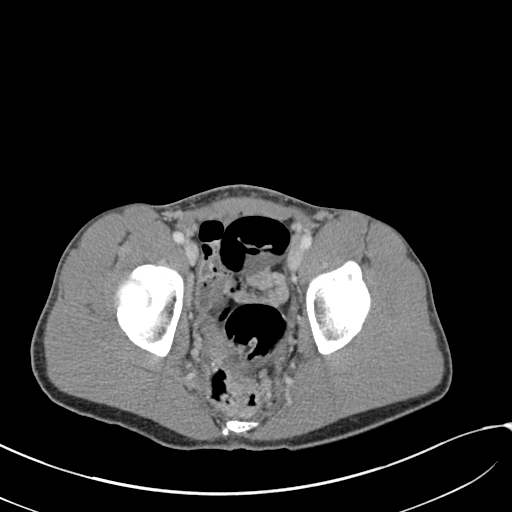
[im 32/53  soft-tissue]
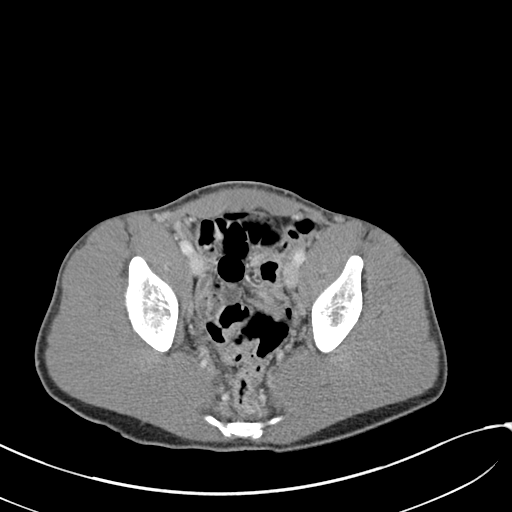
[im 32/53  bone]
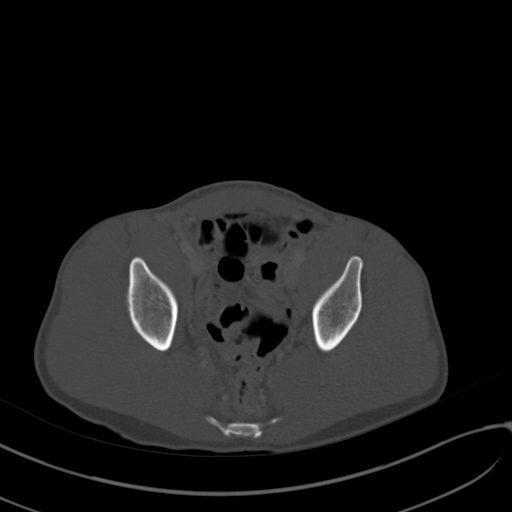
[im 36/53  soft-tissue]
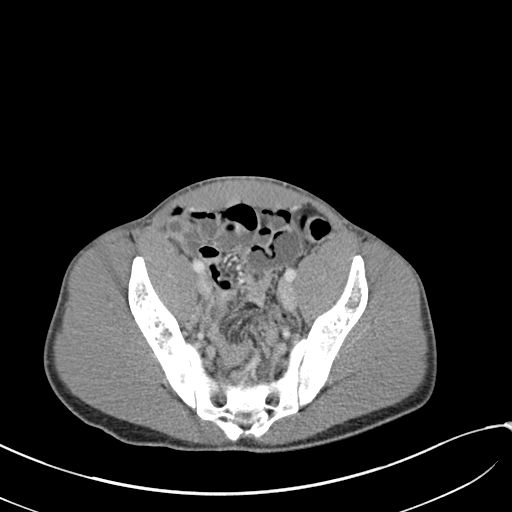
[im 39/53  soft-tissue]
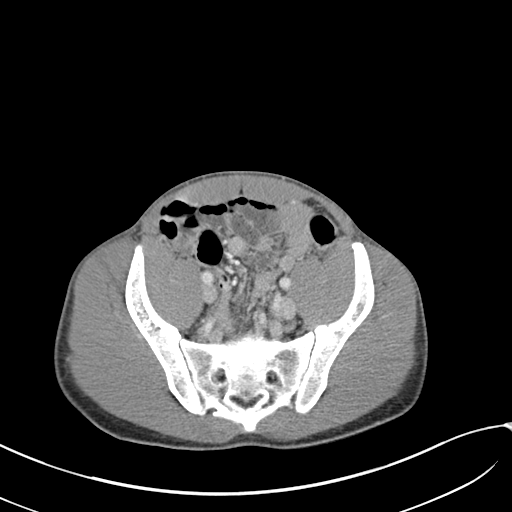
[im 42/53  soft-tissue]
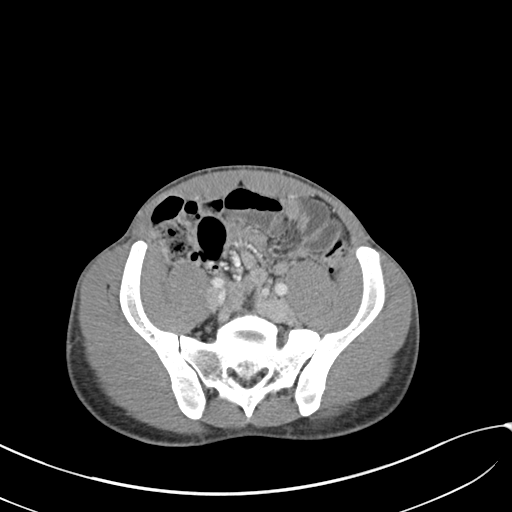
[im 46/53  soft-tissue]
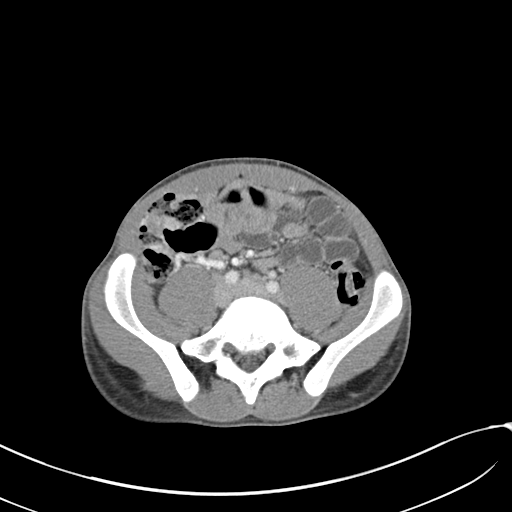
[im 49/53  soft-tissue]
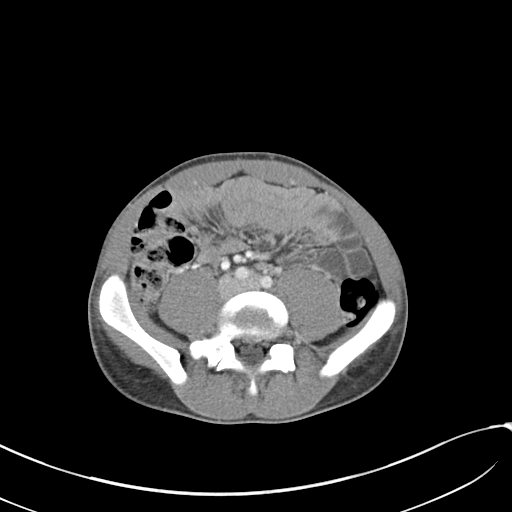

[Series 5: coronal st · coronal · 0.52mm/px · 3 of 110 slices shown]
[im 37/110  soft-tissue]
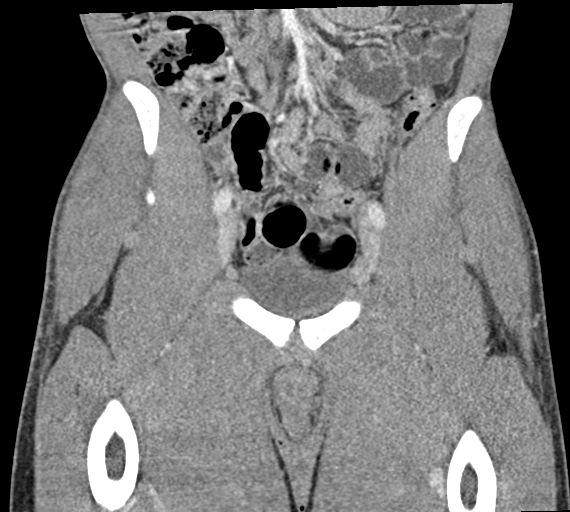
[im 49/110  soft-tissue]
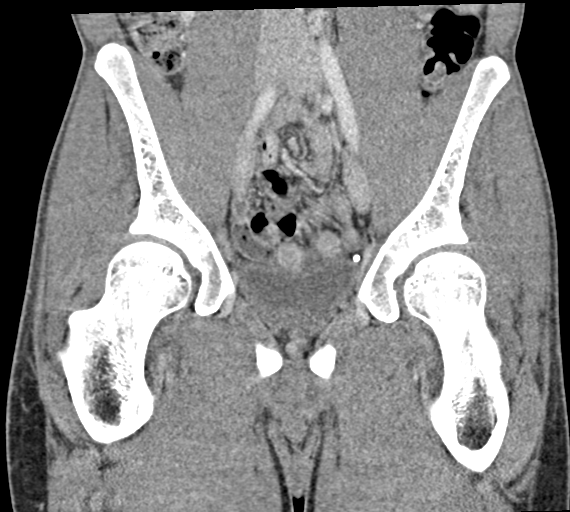
[im 61/110  soft-tissue]
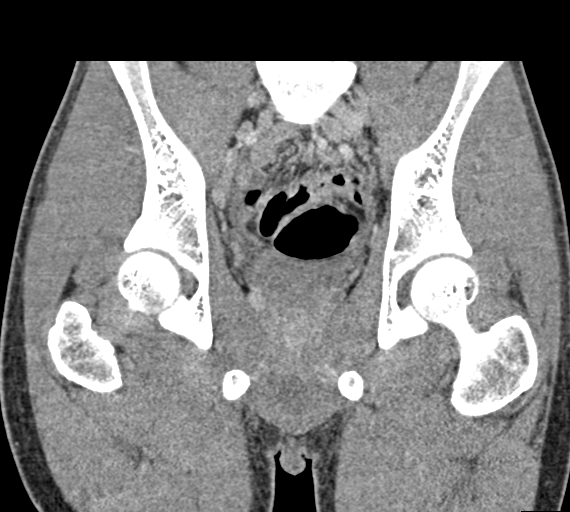

[17 of 46 positions shown; findings below may reference images not displayed]

FINDINGS: Urinary Tract:  Bladder is partially decompressed.

Bowel: No dilated large or small bowel loops within the pelvis.
Probable thickening of the walls of the rectum, difficult to
definitively characterize due to its tortuosity.

Vascular/Lymphatic: Mildly prominent lymph nodes within the inguinal
canal regions, likely reactive in nature. No vascular abnormality
seen.

Reproductive:  No abnormality seen.

Other: No free fluid or abscess collection within the abdomen or
pelvis.

However, there is a fluid collection at the anal verge, to the LEFT
of midline, measuring 1.9 x 0.9 cm, presumed abscess and or anal
fissure.

Musculoskeletal: No acute or suspicious osseous finding.
IMPRESSION: 1. 1.9 x 0.9 cm fluid density collection at the anal verge, to the
LEFT of midline, presumed abscess and/or anal fissure.
2. Probable thickening of the walls of the overlying rectum,
difficult to definitively characterize due to its tortuosity, but
suspicious for associated proctitis.
3. Mildly prominent lymph nodes within the inguinal canal regions,
likely reactive in nature.

## 2020-04-14 ENCOUNTER — Encounter (HOSPITAL_COMMUNITY): Payer: Self-pay | Admitting: Emergency Medicine

## 2020-04-14 ENCOUNTER — Emergency Department (HOSPITAL_COMMUNITY)
Admission: EM | Admit: 2020-04-14 | Discharge: 2020-04-14 | Disposition: A | Discharge status: Home / Self Care | Payer: 59 | Attending: Emergency Medicine | Admitting: Emergency Medicine

## 2020-04-14 DIAGNOSIS — Z5321 Procedure and treatment not carried out due to patient leaving prior to being seen by health care provider: Secondary | ICD-10-CM | POA: Diagnosis not present

## 2020-04-14 DIAGNOSIS — S3992XA Unspecified injury of lower back, initial encounter: Secondary | ICD-10-CM | POA: Insufficient documentation

## 2020-04-14 DIAGNOSIS — S4991XA Unspecified injury of right shoulder and upper arm, initial encounter: Secondary | ICD-10-CM | POA: Diagnosis not present

## 2020-04-14 NOTE — ED Notes (Signed)
Pt stated he didn't want to wait any longer and would follow up with urgent care tomorrow, pt left
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
## 2020-06-07 ENCOUNTER — Other Ambulatory Visit: Payer: Self-pay

## 2020-06-07 ENCOUNTER — Encounter: Payer: Self-pay | Admitting: *Deleted

## 2020-06-07 ENCOUNTER — Ambulatory Visit
Admission: EM | Admit: 2020-06-07 | Discharge: 2020-06-07 | Disposition: A | Discharge status: Home / Self Care | Payer: Managed Care, Other (non HMO) | Attending: Emergency Medicine | Admitting: Emergency Medicine

## 2020-06-07 DIAGNOSIS — K611 Rectal abscess: Secondary | ICD-10-CM | POA: Diagnosis not present

## 2020-06-07 HISTORY — DX: Rectal abscess: K61.1

## 2020-06-07 MED ORDER — CEFTRIAXONE SODIUM 1 G IJ SOLR
1.0000 g | Freq: Once | INTRAMUSCULAR | Status: AC
  Administered 2020-06-07: 1 g via INTRAMUSCULAR

## 2020-06-07 MED ORDER — CLINDAMYCIN HCL 300 MG PO CAPS: 300.0000 mg | ORAL_CAPSULE | Freq: Three times a day (TID) | ORAL | 0 refills | Status: AC

## 2020-06-07 NOTE — ED Provider Notes (Signed)
[name]    CSN: [id number] Arrival date & time: 06/07/20  1039      History   Chief Complaint Chief Complaint  Patient presents with  . Rectal Pain    HPI Devon Hernandez is a 25 y.o. male history of HIV, presenting today for evaluation of buttock pain.  Reports over the past 2 weeks has had slight increase in discomfort rectally, for the past 5 days has noticed area of swelling and increased rectal pain similar to prior rectal abscesses.  Reports in past have resolved with antibiotics alone, has needed it I&D previously.  Denies any pus with bowel movements.  Denies fevers.  HPI  Past Medical History:  Diagnosis Date  . ADHD (attention deficit hyperactivity disorder)   . Allergy   . Anxiety   . Bipolar disorder ([place])   . Depression   . Headache(784.0)   . HIV (human immunodeficiency virus infection) ([place]) 07/05/2018  . Immune deficiency disorder ([place])   . Rectal abscess     Patient Active Problem List   Diagnosis Date Noted  . Routine screening for STI (sexually transmitted infection) 04/22/2019  . Encounter for long-term (current) use of high-risk medication 04/22/2019  . Major depressive disorder, recurrent episode, mild ([place]) 10/13/2018  . Human immunodeficiency virus (HIV) disease ([place]) 07/23/2018  . 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 Perianal wart 03/30/2017  . Polysubstance abuse ([place]) 07/11/2013  . MDD (major depressive disorder), recurrent episode, severe ([place]) 01/18/2013  . ADHD (attention deficit hyperactivity disorder), combined type 01/18/2013  . Conduct disorder, 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    Past Surgical History:  Procedure Laterality Date  . TYMPANOSTOMY TUBE PLACEMENT         Home Medications    Prior to Admission medications   Medication Sig Start Date End Date Taking? Authorizing Provider  BIKTARVY 50-200-25 MG TABS tablet TAKE 1 TABLET BY MOUTH 1 TIME A DAY. 02/26/20  Yes Comer, Okey Regal, MD   clindamycin (CLEOCIN) 300 MG capsule Take 1 capsule (300 mg total) by mouth 3 (three) times daily for 7 days. 06/07/20 06/14/20 Yes Trevone Prestwood C, PA-C  hydrOXYzine (ATARAX/VISTARIL) 25 MG tablet Take 25 mg by mouth 3 (three) times daily as needed.   Yes [provider]  lamoTRIgine (LAMICTAL) 150 MG tablet Take 150 mg by mouth 2 (two) times daily.   Yes [provider]  lithium carbonate 300 MG capsule Take 300 mg by mouth 2 (two) times daily with a meal.   Yes [provider]  mirtazapine (REMERON) 15 MG tablet Take 15 mg by mouth at bedtime.   Yes [provider]  risperiDONE (RISPERDAL PO) Take by mouth.   Yes [provider]  albuterol (VENTOLIN HFA) 108 (90 Base) MCG/ACT inhaler Inhale 2 puffs into the lungs every 4 (four) hours as needed for wheezing or shortness of breath. Patient not taking: Reported on 02/12/2020 10/18/19 06/07/20  Hall-Potvin, [name], PA-C  dicyclomine (BENTYL) 20 MG tablet Take 1 tablet (20 mg total) by mouth 2 (two) times daily as needed for spasms. Patient not taking: Reported on 07/23/2018 01/18/18 10/11/18  Ward, Ozella Almond, PA-C  ipratropium (ATROVENT) 0.06 % nasal spray Place 2 sprays into both nostrils 4 (four) times daily. Patient not taking: Reported on 07/23/2018 09/09/17 10/11/18  Arturo Morton    Family History Family History  Problem Relation Age of Onset  . Depression Mother   . Hypertension Mother   . Depression  Father        Hx: PTSD  . Diabetes Maternal Aunt   . AVM Maternal Grandmother   . Aneurysm Maternal Grandmother   . Seizures Maternal Grandmother     Social History Social History   Tobacco Use  . Smoking status: Former Smoker    Types: Cigars, Cigarettes  . Smokeless tobacco: Never Used  . Tobacco comment: smokes every couple of months    Vaping Use  . Vaping Use: Every day  Substance Use Topics  . Alcohol use: Yes    Comment: occasionally  . Drug use: Yes    Types: Marijuana      Allergies   Amoxicillin and Penicillins   Review of Systems Review of Systems  Constitutional: Negative for fatigue and fever.  Eyes: Negative for redness, itching and visual disturbance.  Respiratory: Negative for shortness of breath.   Cardiovascular: Negative for chest pain and leg swelling.  Gastrointestinal: Positive for rectal pain. Negative for nausea and vomiting.  Musculoskeletal: Negative for arthralgias and myalgias.  Skin: Negative for color change, rash and wound.  Neurological: Negative for dizziness, syncope, weakness, light-headedness and headaches.     Physical Exam Triage Vital Signs ED Triage Vitals [06/07/20 1114]  Enc Vitals Group     BP (!) 141/91     Pulse Rate 88     Resp 18     Temp 98.7 F (37.1 C)     Temp Source Oral     SpO2 99 %     Weight      Height      Head Circumference      Peak Flow      Pain Score      Pain Loc      Pain Edu?      Excl. in [place]?    No data found.  Updated Vital Signs BP (!) 141/91 (BP Location: Left Arm)   Pulse 88   Temp 98.7 F (37.1 C) (Oral)   Resp 18   SpO2 99%   Visual Acuity Right Eye Distance:   Left Eye Distance:   Bilateral Distance:    Right Eye Near:   Left Eye Near:    Bilateral Near:     Physical Exam Vitals and nursing note reviewed.  Constitutional:      Appearance: He is well-developed and well-nourished.     Comments: No acute distress  HENT:     Head: Normocephalic and atraumatic.     Nose: Nose normal.  Eyes:     Conjunctiva/sclera: Conjunctivae normal.  Cardiovascular:     Rate and Rhythm: Normal rate.  Pulmonary:     Effort: Pulmonary effort is normal. No respiratory distress.  Abdominal:     General: There is no distension.  Genitourinary:    Comments: Rectum with area of fluctuance mild induration with tenderness noted to 9 o'clock position, discomfort on DRE, but no palpable tract, no pus noted on glove, does not extend onto perineum Musculoskeletal:         General: Normal range of motion.     Cervical back: Neck supple.  Skin:    General: Skin is warm and dry.  Neurological:     Mental Status: He is alert and oriented to person, place, and time.  Psychiatric:        Mood and Affect: Mood and affect normal.      UC Treatments / Results  Labs (all labs ordered are listed, but only abnormal results are displayed)  Labs Reviewed - No data to display  EKG   Radiology No results found.  Procedures Procedures (including critical care time)  Medications Ordered in UC Medications  cefTRIAXone (ROCEPHIN) injection 1 g (1 g Intramuscular Given 06/07/20 1159)    Initial Impression / Assessment and Plan / UC Course  I have reviewed the triage vital signs and the nursing notes.  Pertinent labs & imaging results that were available during my care of the patient were reviewed by me and considered in my medical decision making (see chart for details).     Perirectal abscess-discussed complications of perirectal abscess including fistula and vascular area, deferring I&D, discussed following up in emergency room for CT scan and further drainage if patient able today, did provide him Rocephin prior to discharge and clindamycin in the case that patient does not present to ED today, warm compresses and soaks.  Patient to go to emergency room with any worsening symptoms.  Discussed strict return precautions. Patient verbalized understanding and is agreeable with plan.  Final Clinical Impressions(s) / UC Diagnoses   Final diagnoses:  Perirectal abscess     Discharge Instructions     Please follow-up in emergency room for further imaging and drainage of abscess  We gave you a shot of Rocephin Begin clindamycin every 8 hours for the next week Warm compresses and soaks    ED Prescriptions    Medication Sig Dispense Auth. Provider   clindamycin (CLEOCIN) 300 MG capsule Take 1 capsule (300 mg total) by mouth 3 (three) times daily for 7  days. 21 capsule Kordae Buonocore, Beverly C, PA-C     PDMP not reviewed this encounter.   Janith Lima, PA-C 06/07/20 1253

## 2020-06-07 NOTE — Discharge Instructions (Signed)
Please follow-up in emergency room for further imaging and drainage of abscess  We gave you a shot of Rocephin Begin clindamycin every 8 hours for the next week Warm compresses and soaks

## 2020-06-07 NOTE — ED Triage Notes (Signed)
C/O rectal pain x approx 5 days without fevers.  STates has had same pain in past and has had rectal abscesses.

## 2020-07-23 ENCOUNTER — Other Ambulatory Visit: Payer: Self-pay

## 2020-07-23 DIAGNOSIS — Z79899 Other long term (current) drug therapy: Secondary | ICD-10-CM

## 2020-07-23 DIAGNOSIS — B20 Human immunodeficiency virus [HIV] disease: Secondary | ICD-10-CM

## 2020-07-23 DIAGNOSIS — Z113 Encounter for screening for infections with a predominantly sexual mode of transmission: Secondary | ICD-10-CM

## 2020-07-28 ENCOUNTER — Other Ambulatory Visit: Payer: Managed Care, Other (non HMO)

## 2020-07-29 ENCOUNTER — Other Ambulatory Visit: Payer: Managed Care, Other (non HMO)

## 2020-07-29 ENCOUNTER — Other Ambulatory Visit: Payer: Self-pay

## 2020-07-29 ENCOUNTER — Other Ambulatory Visit (HOSPITAL_COMMUNITY)
Admission: RE | Admit: 2020-07-29 | Discharge: 2020-07-29 | Disposition: A | Discharge status: Home / Self Care | Payer: Managed Care, Other (non HMO) | Source: Ambulatory Visit | Attending: Internal Medicine | Admitting: Internal Medicine

## 2020-07-29 DIAGNOSIS — Z113 Encounter for screening for infections with a predominantly sexual mode of transmission: Secondary | ICD-10-CM | POA: Insufficient documentation

## 2020-07-29 DIAGNOSIS — B20 Human immunodeficiency virus [HIV] disease: Secondary | ICD-10-CM

## 2020-07-29 DIAGNOSIS — Z79899 Other long term (current) drug therapy: Secondary | ICD-10-CM

## 2020-07-30 LAB — URINE CYTOLOGY ANCILLARY ONLY
Chlamydia: NEGATIVE
Comment: NEGATIVE
Comment: NORMAL
Neisseria Gonorrhea: NEGATIVE

## 2020-07-30 LAB — T-HELPER CELL (CD4) - (RCID CLINIC ONLY)
CD4 % Helper T Cell: 41 % (ref 33–65)
CD4 T Cell Abs: 1324 /uL (ref 400–1790)

## 2020-07-31 LAB — COMPREHENSIVE METABOLIC PANEL
AG Ratio: 2.1 (calc) (ref 1.0–2.5)
ALT: 9 U/L (ref 9–46)
AST: 18 U/L (ref 10–40)
Albumin: 4.8 g/dL (ref 3.6–5.1)
Alkaline phosphatase (APISO): 63 U/L (ref 36–130)
BUN: 12 mg/dL (ref 7–25)
CO2: 27 mmol/L (ref 20–32)
Calcium: 10 mg/dL (ref 8.6–10.3)
Chloride: 104 mmol/L (ref 98–110)
Creat: 1.2 mg/dL (ref 0.60–1.35)
Globulin: 2.3 g/dL (calc) (ref 1.9–3.7)
Glucose, Bld: 97 mg/dL (ref 65–99)
Potassium: 4.1 mmol/L (ref 3.5–5.3)
Sodium: 140 mmol/L (ref 135–146)
Total Bilirubin: 1 mg/dL (ref 0.2–1.2)
Total Protein: 7.1 g/dL (ref 6.1–8.1)

## 2020-07-31 LAB — LIPID PANEL
Cholesterol: 121 mg/dL (ref <200)
HDL: 34 mg/dL — ABNORMAL LOW (ref >=40)
LDL Cholesterol (Calc): 67 mg/dL (calc)
Non-HDL Cholesterol (Calc): 87 mg/dL (calc) (ref <130)
Total CHOL/HDL Ratio: 3.6 (calc) (ref <5.0)
Triglycerides: 110 mg/dL (ref <150)

## 2020-07-31 LAB — CBC WITH DIFFERENTIAL/PLATELET
Absolute Monocytes: 540 cells/uL (ref 200–950)
Basophils Absolute: 50 cells/uL (ref 0–200)
Basophils Relative: 0.6 %
Eosinophils Absolute: 108 cells/uL (ref 15–500)
Eosinophils Relative: 1.3 %
HCT: 42.2 % (ref 38.5–50.0)
Hemoglobin: 14.4 g/dL (ref 13.2–17.1)
Lymphs Abs: 3445 cells/uL (ref 850–3900)
MCH: 31.5 pg (ref 27.0–33.0)
MCHC: 34.1 g/dL (ref 32.0–36.0)
MCV: 92.3 fL (ref 80.0–100.0)
MPV: 10 fL (ref 7.5–12.5)
Monocytes Relative: 6.5 %
Neutro Abs: 4158 cells/uL (ref 1500–7800)
Neutrophils Relative %: 50.1 %
Platelets: 240 10*3/uL (ref 140–400)
RBC: 4.57 10*6/uL (ref 4.20–5.80)
RDW: 12.6 % (ref 11.0–15.0)
Total Lymphocyte: 41.5 %
WBC: 8.3 10*3/uL (ref 3.8–10.8)

## 2020-07-31 LAB — HIV-1 RNA QUANT-NO REFLEX-BLD
HIV 1 RNA Quant: NOT DETECTED Copies/mL
HIV-1 RNA Quant, Log: NOT DETECTED Log cps/mL

## 2020-07-31 LAB — FLUORESCENT TREPONEMAL AB(FTA)-IGG-BLD: Fluorescent Treponemal ABS: REACTIVE — AB

## 2020-07-31 LAB — RPR TITER: RPR Titer: 1:4 {titer} — ABNORMAL HIGH

## 2020-07-31 LAB — RPR: RPR Ser Ql: REACTIVE — AB

## 2020-08-12 ENCOUNTER — Encounter: Payer: Managed Care, Other (non HMO) | Admitting: Internal Medicine

## 2020-08-22 ENCOUNTER — Emergency Department (HOSPITAL_BASED_OUTPATIENT_CLINIC_OR_DEPARTMENT_OTHER)
Admission: EM | Admit: 2020-08-22 | Discharge: 2020-08-22 | Disposition: A | Discharge status: Home / Self Care | Payer: Managed Care, Other (non HMO) | Attending: Emergency Medicine | Admitting: Emergency Medicine

## 2020-08-22 ENCOUNTER — Encounter (HOSPITAL_BASED_OUTPATIENT_CLINIC_OR_DEPARTMENT_OTHER): Payer: Self-pay | Admitting: Emergency Medicine

## 2020-08-22 ENCOUNTER — Other Ambulatory Visit: Payer: Self-pay

## 2020-08-22 DIAGNOSIS — Z87891 Personal history of nicotine dependence: Secondary | ICD-10-CM | POA: Insufficient documentation

## 2020-08-22 DIAGNOSIS — Z21 Asymptomatic human immunodeficiency virus [HIV] infection status: Secondary | ICD-10-CM | POA: Insufficient documentation

## 2020-08-22 DIAGNOSIS — K5900 Constipation, unspecified: Secondary | ICD-10-CM | POA: Insufficient documentation

## 2020-08-22 MED ORDER — POLYETHYLENE GLYCOL 3350 17 GM/SCOOP PO POWD: 1.0000 | Freq: Once | ORAL | 0 refills | Status: AC

## 2020-08-22 MED ORDER — HYDROCORTISONE (PERIANAL) 2.5 % EX CREA: 1.0000 "application " | TOPICAL_CREAM | Freq: Two times a day (BID) | CUTANEOUS | 0 refills | Status: DC

## 2020-08-22 MED ORDER — SENNOSIDES-DOCUSATE SODIUM 8.6-50 MG PO TABS: 1.0000 | ORAL_TABLET | Freq: Every day | ORAL | 0 refills | Status: AC

## 2020-08-22 NOTE — Discharge Instructions (Signed)
Recommend following up with gastroenterology actually to further evaluate for mucousy stool.  This sometimes could be a sign of inflammatory bowel disease.  There would also be great follow-up to ensure that you are having good bowel movements.  I would recommend starting with 4 scoops/packets of MiraLAX every 12-24 hours and titrate to effect (mix in 20-30 ounces of water).  Recommend that you consider continuing some dosage of MiraLAX until you are having a nice easy stool every other day or daily.

## 2020-08-22 NOTE — ED Triage Notes (Signed)
Pt reports mucus and blood  For about a week.pt reports no other symptoms.

## 2020-08-22 NOTE — ED Provider Notes (Signed)
[facility] EMERGENCY DEPT Provider Note   CSN: [id number] Arrival date & time: 08/22/20  1355     History Chief Complaint  Patient presents with  . Constipation    Devon Hernandez is a 25 y.o. male.  The history is provided by the patient.  Constipation Severity:  Moderate Time since last bowel movement:  10 days Timing:  Constant Progression:  Unchanged Chronicity:  New Context: dehydration   Stool description:  Mucus containing Relieved by:  Nothing Worsened by:  Nothing Associated symptoms: no abdominal pain, no anorexia, no back pain, no diarrhea, no dysuria, no fever, no flatus, no hematochezia, no nausea, no urinary retention and no vomiting        Past Medical History:  Diagnosis Date  . ADHD (attention deficit hyperactivity disorder)   . Allergy   . Anxiety   . Bipolar disorder ([place])   . Depression   . Headache(784.0)   . HIV (human immunodeficiency virus infection) ([place]) 07/05/2018  . Immune deficiency disorder ([place])   . Rectal abscess     Patient Active Problem List   Diagnosis Date Noted  . Routine screening for STI (sexually transmitted infection) 04/22/2019  . Encounter for long-term (current) use of high-risk medication 04/22/2019  . Major depressive disorder, recurrent episode, mild ([place]) 10/13/2018  . Human immunodeficiency virus (HIV) disease ([place]) 07/23/2018  . 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 Perianal wart 03/30/2017  . Polysubstance abuse ([place]) 07/11/2013  . MDD (major depressive disorder), recurrent episode, severe ([place]) 01/18/2013  . ADHD (attention deficit hyperactivity disorder), combined type 01/18/2013  . Conduct disorder, 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    Past Surgical History:  Procedure Laterality Date  . TYMPANOSTOMY TUBE PLACEMENT         Family History  Problem Relation Age of Onset  . Depression Mother   . Hypertension Mother   . Depression Father        Hx: PTSD  .  Diabetes Maternal Aunt   . AVM Maternal Grandmother   . Aneurysm Maternal Grandmother   . Seizures Maternal Grandmother     Social History   Tobacco Use  . Smoking status: Former Smoker    Types: Cigars, Cigarettes  . Smokeless tobacco: Never Used  . Tobacco comment: smokes every couple of months    Vaping Use  . Vaping Use: Every day  Substance Use Topics  . Alcohol use: Yes    Comment: occasionally  . Drug use: Yes    Types: Marijuana    Home Medications Prior to Admission medications   Medication Sig Start Date End Date Taking? Authorizing Provider  hydrocortisone (ANUSOL-HC) 2.5 % rectal cream Place 1 application rectally 2 (two) times daily. 08/22/20  Yes Jeannetta Cerutti, DO  polyethylene glycol powder (GLYCOLAX/MIRALAX) 17 GM/SCOOP powder Take 255 g by mouth once for 1 dose. 08/22/20 08/22/20 Yes Danon Lograsso, DO  senna-docusate (SENOKOT-S) 8.6-50 MG tablet Take 1 tablet by mouth daily. 08/22/20 09/21/20 Yes Mallory Enriques, DO  BIKTARVY 50-200-25 MG TABS tablet TAKE 1 TABLET BY MOUTH 1 TIME A DAY. 02/26/20   Thayer Headings, MD  hydrOXYzine (ATARAX/VISTARIL) 25 MG tablet Take 25 mg by mouth 3 (three) times daily as needed.    [provider]  lamoTRIgine (LAMICTAL) 150 MG tablet Take 150 mg by mouth 2 (two) times daily.    [provider]  lithium carbonate 300 MG capsule Take 300 mg by mouth 2 (two) times daily with a meal.  [provider]  mirtazapine (REMERON) 15 MG tablet Take 15 mg by mouth at bedtime.    [provider]  risperiDONE (RISPERDAL PO) Take by mouth.    [provider]  albuterol (VENTOLIN HFA) 108 (90 Base) MCG/ACT inhaler Inhale 2 puffs into the lungs every 4 (four) hours as needed for wheezing or shortness of breath. Patient not taking: Reported on 02/12/2020 10/18/19 06/07/20  Hall-Potvin, [name], PA-C  dicyclomine (BENTYL) 20 MG tablet Take 1 tablet (20 mg total) by mouth 2 (two) times daily as needed for  spasms. Patient not taking: Reported on 07/23/2018 01/18/18 10/11/18  Ward, Ozella Almond, PA-C  ipratropium (ATROVENT) 0.06 % nasal spray Place 2 sprays into both nostrils 4 (four) times daily. Patient not taking: Reported on 07/23/2018 09/09/17 10/11/18  Ok Edwards, PA-C    Allergies    Amoxicillin and Penicillins  Review of Systems   Review of Systems  Constitutional: Negative for fever.  HENT: Negative for congestion, hearing loss, sore throat and tinnitus.   Gastrointestinal: Positive for constipation. Negative for abdominal pain, anorexia, diarrhea, flatus, hematochezia, nausea, rectal pain and vomiting.  Genitourinary: Negative for dysuria.  Musculoskeletal: Negative for back pain.    Physical Exam Updated Vital Signs BP 132/73   Pulse (!) 104   Temp 98.5 F (36.9 C) (Oral)   Resp 17   Ht 5\' 9"  (1.753 m)   Wt 57.2 kg   SpO2 99%   BMI 18.61 kg/m   Physical Exam Constitutional:      General: He is not in acute distress.    Appearance: He is not ill-appearing.  HENT:     Mouth/Throat:     Mouth: Mucous membranes are moist.  Eyes:     Extraocular Movements: Extraocular movements intact.     Pupils: Pupils are equal, round, and reactive to light.  Cardiovascular:     Pulses: Normal pulses.  Abdominal:     General: Abdomen is flat. There is no distension.     Palpations: There is no mass.     Tenderness: There is no abdominal tenderness.     Hernia: No hernia is present.  Genitourinary:    Rectum: Normal.     Comments: No obvious hemorrhoids, no fluctuance or perirectal abscess Skin:    Capillary Refill: Capillary refill takes less than 2 seconds.  Neurological:     General: No focal deficit present.     Mental Status: He is alert.  Psychiatric:        Mood and Affect: Mood normal.     ED Results / Procedures / Treatments   Labs (all labs ordered are listed, but only abnormal results are displayed) Labs Reviewed - No data to  display  EKG None  Radiology No results found.  Procedures Procedures   Medications Ordered in ED Medications - No data to display  ED Course  I have reviewed the triage vital signs and the nursing notes.  Pertinent labs & imaging results that were available during my care of the patient were reviewed by me and considered in my medical decision making (see chart for details).    MDM Rules/Calculators/A&P                          Devon Hernandez is here with constipation.  Normal vitals.  No fever.  Has had some mucousy stool over the last several days with no real bowel movement.  Denies any fevers or  chills.  No signs of hemorrhoid or rectal abscess on exam.  Has a picture of some mucousy stool on his phone.  No obvious melena or hematochezia.  We will start him on his bowel regimen have him follow-up with GI.  Could be inflammatory bowel process.  Does have history of HIV.  Otherwise appears well.  Given return precautions and discharged in ED in good condition.  This chart was dictated using voice recognition software.  Despite best efforts to proofread,  errors can occur which can change the documentation meaning.   Final Clinical Impression(s) / ED Diagnoses Final diagnoses:  Constipation, unspecified constipation type    Rx / DC Orders ED Discharge Orders         Ordered    polyethylene glycol powder (GLYCOLAX/MIRALAX) 17 GM/SCOOP powder   Once        08/22/20 1421    senna-docusate (SENOKOT-S) 8.6-50 MG tablet  Daily        08/22/20 1421    hydrocortisone (ANUSOL-HC) 2.5 % rectal cream  2 times daily        08/22/20 1421           Ramond Darnell, Quita Skye, DO 08/22/20 1424

## 2020-08-25 ENCOUNTER — Other Ambulatory Visit: Payer: Self-pay

## 2020-08-25 ENCOUNTER — Encounter: Payer: Self-pay | Admitting: Internal Medicine

## 2020-08-25 ENCOUNTER — Ambulatory Visit (INDEPENDENT_AMBULATORY_CARE_PROVIDER_SITE_OTHER): Payer: Managed Care, Other (non HMO) | Admitting: Internal Medicine

## 2020-08-25 VITALS — BP 110/72 | HR 75 | Temp 97.3°F | Resp 16 | Ht 69.0 in | Wt 122.0 lb

## 2020-08-25 DIAGNOSIS — Z23 Encounter for immunization: Secondary | ICD-10-CM

## 2020-08-25 DIAGNOSIS — Z113 Encounter for screening for infections with a predominantly sexual mode of transmission: Secondary | ICD-10-CM

## 2020-08-25 DIAGNOSIS — K5900 Constipation, unspecified: Secondary | ICD-10-CM | POA: Diagnosis not present

## 2020-08-25 DIAGNOSIS — Z79899 Other long term (current) drug therapy: Secondary | ICD-10-CM

## 2020-08-25 DIAGNOSIS — B20 Human immunodeficiency virus [HIV] disease: Secondary | ICD-10-CM

## 2020-08-25 NOTE — Progress Notes (Signed)
   Subjective:    Patient ID: Devon Hernandez, male    DOB: 1995-10-15, 25 y.o.   MRN: [id number]  HPI Here for follow up of HIV He continues on Biktarvy and has no issues with getting, taking or tolerating the medication.  CD4 remains good at 1324 and viral load not detected.  RPR decreasing to 1:4 and no new risk factors.     Review of Systems  Constitutional: Negative for fatigue.  Gastrointestinal: Negative for diarrhea and nausea.  Skin: Negative for rash.       Objective:   Physical Exam Eyes:     General: No scleral icterus. Cardiovascular:     Rate and Rhythm: Normal rate and regular rhythm.  Abdominal:     General: There is distension.     Palpations: Abdomen is soft. There is no mass.     Tenderness: There is no abdominal tenderness. There is no guarding or rebound.  Neurological:     General: No focal deficit present.     Mental Status: He is alert.  Psychiatric:        Mood and Affect: Mood normal.   SH; occasional tobacco       Assessment & Plan:

## 2020-08-25 NOTE — Addendum Note (Signed)
Addended by: Theresia Majors A on: 08/25/2020 11:28 AM   Modules accepted: Orders

## 2020-08-25 NOTE — Assessment & Plan Note (Signed)
He continues to do well from this standpoint and no concerns.  Continues with Biktarvy and rtc in 6 months.

## 2020-08-25 NOTE — Assessment & Plan Note (Signed)
Lipid panel noted.  No concerns

## 2020-08-25 NOTE — Assessment & Plan Note (Addendum)
This is a new issue and associated with weight loss.  No concerns on exam with a soft abdomen, no tenderness.  He has tried mirilax and stool softener and no improvement.  I will send him to GI urgently to be seen before it becomes a bigger issue.

## 2020-08-25 NOTE — Assessment & Plan Note (Signed)
Screened negative 

## 2020-08-27 ENCOUNTER — Other Ambulatory Visit: Payer: Self-pay | Admitting: Gastroenterology

## 2020-08-27 ENCOUNTER — Ambulatory Visit (INDEPENDENT_AMBULATORY_CARE_PROVIDER_SITE_OTHER): Payer: Managed Care, Other (non HMO) | Admitting: Gastroenterology

## 2020-08-27 ENCOUNTER — Ambulatory Visit: Payer: Managed Care, Other (non HMO) | Admitting: Gastroenterology

## 2020-08-27 ENCOUNTER — Encounter: Payer: Self-pay | Admitting: Gastroenterology

## 2020-08-27 VITALS — BP 100/60 | HR 82 | Ht 69.0 in | Wt 124.0 lb

## 2020-08-27 DIAGNOSIS — R198 Other specified symptoms and signs involving the digestive system and abdomen: Secondary | ICD-10-CM | POA: Diagnosis not present

## 2020-08-27 DIAGNOSIS — K625 Hemorrhage of anus and rectum: Secondary | ICD-10-CM

## 2020-08-27 DIAGNOSIS — R140 Abdominal distension (gaseous): Secondary | ICD-10-CM | POA: Diagnosis not present

## 2020-08-27 DIAGNOSIS — K5900 Constipation, unspecified: Secondary | ICD-10-CM

## 2020-08-27 MED ORDER — CLENPIQ 10-3.5-12 MG-GM -GM/160ML PO SOLN: 320.0000 mL | Freq: Once | ORAL | 0 refills | Status: AC

## 2020-08-27 NOTE — Patient Instructions (Addendum)
If you are age 24 or older, your body mass index should be between 23-30. Your Body mass index is 18.31 kg/m. If this is out of the aforementioned range listed, please consider follow up with your Primary Care Provider.  If you are age 37 or younger, your body mass index should be between 19-25. Your Body mass index is 18.31 kg/m. If this is out of the aformentioned range listed, please consider follow up with your Primary Care Provider.   Your provider has requested that you go to the basement level for lab work before leaving today. Press "B" on the elevator. The lab is located at the first door on the left as you exit the elevator.    Janett Billow recommends that you complete a bowel purge (to clean out your bowels). Please do the following: Purchase a bottle of Miralax over the counter as well as a box of 5 mg dulcolax tablets. Take 4 dulcolax tablets. Wait 1 hour. You will then drink 6-8 capfuls of Miralax mixed in an adequate amount of water/juice/gatorade (you may choose which of these liquids to drink) over the next 2-3 hours. You should expect results within 1 to 6 hours after completing the bowel purge. Purge  Start Miralx once or twice daily.  Follow up with your primary care if warm compress does not work.   Thank you for choosing me and [facility].  Janett Billow Zehr,PA-C

## 2020-08-27 NOTE — Progress Notes (Signed)
08/27/2020 Devon Hernandez [id number] 06/11/1995   HISTORY OF PRESENT ILLNESS: This is a 25 year old male who is new to our office.  He was referred here by Dr. Linus Salmons, of infectious disease for evaluation of constipation.  He tells me that he had sudden onset constipation about 2 weeks ago.  He initially attributed it to his change in diet as he was eating a lot of red meat and cheese.  He saw Dr. Linus Salmons and he recommended they begin taking MiraLAX daily.  He says that he has been using that but it does not seem to be working very well.  He says when he does have bowel movements he passes mucus and blood.  Blood drips into the toilet bowl.  He has had some generalized abdominal pain and bloating for the past 2 weeks as well.  He has lost about 10 pounds over the past 4 weeks unintentionally.  He says that prior to 2 weeks ago he never had any GI issues.  He denies any family history of GI issues to his knowledge.   Past Medical History:  Diagnosis Date  . ADHD (attention deficit hyperactivity disorder)   . Allergy   . Anxiety   . Bipolar disorder ([place])   . Depression   . Headache(784.0)   . HIV (human immunodeficiency virus infection) ([place]) 07/05/2018  . Immune deficiency disorder ([place])   . Rectal abscess    Past Surgical History:  Procedure Laterality Date  . TYMPANOSTOMY TUBE PLACEMENT      reports that he has quit smoking. His smoking use included cigars and cigarettes. He has never used smokeless tobacco. He reports current alcohol use of about 6.0 standard drinks of alcohol per week. He reports current drug use. Drug: Marijuana. family history includes AVM in his maternal grandmother; Aneurysm in his maternal grandmother; Depression in his father and mother; Diabetes in his maternal aunt; Hypertension in his mother; Seizures in his maternal grandmother. Allergies  Allergen Reactions  . Amoxicillin Hives and Diarrhea  . Penicillins     Per Dad; pt states was recently prescribed  PCN "and I didn't have a reaction to it"      Outpatient Encounter Medications as of 08/27/2020  Medication Sig  . BIKTARVY 50-200-25 MG TABS tablet TAKE 1 TABLET BY MOUTH 1 TIME A DAY.  . hydrocortisone (ANUSOL-HC) 2.5 % rectal cream Place 1 application rectally 2 (two) times daily.  . hydrOXYzine (ATARAX/VISTARIL) 25 MG tablet Take 25 mg by mouth 3 (three) times daily as needed.  . lamoTRIgine (LAMICTAL) 150 MG tablet Take 150 mg by mouth 2 (two) times daily.  Marland Kitchen lithium carbonate 300 MG capsule Take 300 mg by mouth 2 (two) times daily with a meal.  . mirtazapine (REMERON) 15 MG tablet Take 15 mg by mouth at bedtime.  . Polyethylene Glycol 3350 (MIRALAX PO) Take by mouth.  . risperiDONE (RISPERDAL PO) Take by mouth.  . senna-docusate (SENOKOT-S) 8.6-50 MG tablet Take 1 tablet by mouth daily.  . [DISCONTINUED] albuterol (VENTOLIN HFA) 108 (90 Base) MCG/ACT inhaler Inhale 2 puffs into the lungs every 4 (four) hours as needed for wheezing or shortness of breath. (Patient not taking: Reported on 02/12/2020)  . [DISCONTINUED] dicyclomine (BENTYL) 20 MG tablet Take 1 tablet (20 mg total) by mouth 2 (two) times daily as needed for spasms. (Patient not taking: Reported on 07/23/2018)  . [DISCONTINUED] ipratropium (ATROVENT) 0.06 % nasal spray Place 2 sprays into both nostrils 4 (four) times daily. (Patient  not taking: Reported on 07/23/2018)   No facility-administered encounter medications on file as of 08/27/2020.    REVIEW OF SYSTEMS  : All other systems reviewed and negative except where noted in the History of Present Illness.   PHYSICAL EXAM: BP 100/60   Pulse 82   Ht 5\' 9"  (1.753 m)   Wt 124 lb (56.2 kg)   SpO2 99%   BMI 18.31 kg/m  General: Well developed AA male in no acute distress Head: Normocephalic and atraumatic Eyes:  Sclerae anicteric, conjunctiva pink. Ears: Normal auditory acuity Lungs: Clear throughout to auscultation; no W/R/R. Heart: Regular rate and rhythm; no  M/R/G. Abdomen: Soft, non-distended.  BS present.  Mild generalized abdominal tenderness. Rectal:  Small perianal abscess noted with pus (this was not actually located directly near the anus and looked like maybe a folliculitis).  DRE did not reveal any pain or masses.  No stool on the exam glove.  Anoscopy was performed and showed a lot of mucus and inflammation suspicious for proctitis.   Musculoskeletal: Symmetrical with no gross deformities  Skin: No lesions on visible extremities Extremities: No edema  Neurological: Alert oriented x 4, grossly non-focal Psychological:  Alert and cooperative. Normal mood and affect  ASSESSMENT AND PLAN: *25 year old male with new onset rectal bleeding with mucus and constipation for the past few weeks.  Symptoms were sudden in onset.  He initially attributed the constipation to a change in his diet as he was eating a lot of red meat and cheese.  MiraLAX daily does not seem to be helping.  He was found to have what appeared to be some proctitis on anoscopy.  There was a lot of mucus present in the rectum as well.  I have concern for some type of proctitis whether it be IBD versus infectious proctitis of some sort with his HIV status; it does not appear to be painful.  We will plan for colonoscopy for evaluation with biopsies.  We will check a sed rate, CRP, and TSH.  He would like to wait and have this performed when he comes in for his procedure.  I am going to have him do a MiraLAX purge this evening and then begin MiraLAX once or twice daily on a regular basis *Perianal abscess: This is actually a small abscess not located directly near the anus and appeared to be a folliculitis.  Advised him to use hot compresses, sitz bath's, etc. to see if it could be decompressed.  He is advised to follow-up with his PCP if it did not improve/resolve or certainly if it worsened or became larger.  He says that he's had these in the past.  CC:  Comer, Okey Regal, MD

## 2020-08-31 ENCOUNTER — Ambulatory Visit (HOSPITAL_COMMUNITY)
Admission: EM | Admit: 2020-08-31 | Discharge: 2020-08-31 | Disposition: A | Discharge status: Home / Self Care | Payer: Managed Care, Other (non HMO) | Attending: Family Medicine | Admitting: Family Medicine

## 2020-08-31 ENCOUNTER — Other Ambulatory Visit: Payer: Self-pay

## 2020-08-31 ENCOUNTER — Encounter (HOSPITAL_COMMUNITY): Payer: Self-pay

## 2020-08-31 DIAGNOSIS — L0291 Cutaneous abscess, unspecified: Secondary | ICD-10-CM | POA: Diagnosis not present

## 2020-08-31 DIAGNOSIS — B20 Human immunodeficiency virus [HIV] disease: Secondary | ICD-10-CM | POA: Diagnosis not present

## 2020-08-31 DIAGNOSIS — K6289 Other specified diseases of anus and rectum: Secondary | ICD-10-CM | POA: Diagnosis not present

## 2020-08-31 MED ORDER — SULFAMETHOXAZOLE-TRIMETHOPRIM 800-160 MG PO TABS: 1.0000 | ORAL_TABLET | Freq: Two times a day (BID) | ORAL | 0 refills | Status: DC

## 2020-08-31 MED ORDER — HIBICLENS 4 % EX LIQD: Freq: Every day | CUTANEOUS | 0 refills | Status: DC | PRN

## 2020-08-31 MED ORDER — HYDROCODONE-ACETAMINOPHEN 5-325 MG PO TABS: 1.0000 | ORAL_TABLET | Freq: Two times a day (BID) | ORAL | 0 refills | Status: DC | PRN

## 2020-08-31 NOTE — ED Provider Notes (Signed)
Devon Hernandez    CSN: [id number] Arrival date & time: 08/31/20  1935      History   Chief Complaint Chief Complaint  Patient presents with  . Rectal pain/abscess    HPI Devon Hernandez is a 25 y.o. male.   Presenting today with 2 weeks of worsening painful bump to left anal area.  He states this is a recurring issue for him and has had it drained multiple times.  Went to a GI specialist 3 days ago due to this issue and was told he may have proctitis but was not put on any medications at that point.  Has been using Neosporin and sitting in warm baths with minimal relief.  He states his pain is a 10 out of 10 at this point.  Taking Tylenol with no relief.  Denies any active drainage from the area, fever, chills, body aches, nausea, vomiting, abdominal pain.  Of note, does have a history of well-controlled HIV on Biktarvy.      Past Medical History:  Diagnosis Date  . ADHD (attention deficit hyperactivity disorder)   . Allergy   . Anxiety   . Bipolar disorder ([place])   . Depression   . Headache(784.0)   . HIV (human immunodeficiency virus infection) ([place]) 07/05/2018  . Immune deficiency disorder ([place])   . Rectal abscess     Patient Active Problem List   Diagnosis Date Noted  . Rectal bleeding 08/27/2020  . Rectal discharge 08/27/2020  . Bloating 08/27/2020  . Constipation 08/25/2020  . Routine screening for STI (sexually transmitted infection) 04/22/2019  . Encounter for long-term (current) use of high-risk medication 04/22/2019  . Major depressive disorder, recurrent episode, mild ([place]) 10/13/2018  . Human immunodeficiency virus (HIV) disease ([place]) 07/23/2018  . 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 Perianal wart 03/30/2017  . Polysubstance abuse ([place]) 07/11/2013  . MDD (major depressive disorder), recurrent episode, severe ([place]) 01/18/2013  . ADHD (attention deficit hyperactivity disorder), combined type 01/18/2013  . Conduct disorder,  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    Past Surgical History:  Procedure Laterality Date  . TYMPANOSTOMY TUBE PLACEMENT         Home Medications    Prior to Admission medications   Medication Sig Start Date End Date Taking? Authorizing Provider  chlorhexidine (HIBICLENS) 4 % external liquid Apply topically daily as needed. 08/31/20  Yes Volney American, PA-C  HYDROcodone-acetaminophen (NORCO/VICODIN) 5-325 MG tablet Take 1 tablet by mouth 2 (two) times daily as needed for moderate pain. 08/31/20  Yes Volney American, PA-C  sulfamethoxazole-trimethoprim (BACTRIM DS) 800-160 MG tablet Take 1 tablet by mouth 2 (two) times daily. 08/31/20  Yes Volney American, PA-C  BIKTARVY 50-200-25 MG TABS tablet TAKE 1 TABLET BY MOUTH 1 TIME A DAY. 02/26/20   Comer, Okey Regal, MD  hydrocortisone (ANUSOL-HC) 2.5 % rectal cream Place 1 application rectally 2 (two) times daily. 08/22/20   Curatolo, Adam, DO  hydrOXYzine (ATARAX/VISTARIL) 25 MG tablet Take 25 mg by mouth 3 (three) times daily as needed.    [provider]  lamoTRIgine (LAMICTAL) 150 MG tablet Take 150 mg by mouth 2 (two) times daily.    [provider]  lithium carbonate 300 MG capsule Take 300 mg by mouth 2 (two) times daily with a meal.    [provider]  mirtazapine (REMERON) 15 MG tablet Take 15 mg by mouth at bedtime.    [provider]  Polyethylene Glycol 3350 (MIRALAX  PO) Take by mouth.    [provider]  risperiDONE (RISPERDAL PO) Take by mouth.    [provider]  senna-docusate (SENOKOT-S) 8.6-50 MG tablet Take 1 tablet by mouth daily. 08/22/20 09/21/20  Lennice Sites, DO    Family History Family History  Problem Relation Age of Onset  . Depression Mother   . Hypertension Mother   . Depression Father        Hx: PTSD  . Diabetes Maternal Aunt   . AVM Maternal Grandmother   . Aneurysm Maternal Grandmother   . Seizures Maternal Grandmother   . Stomach cancer  Neg Hx   . Pancreatic cancer Neg Hx   . Esophageal cancer Neg Hx   . Colon cancer Neg Hx   . Liver disease Neg Hx     Social History Social History   Tobacco Use  . Smoking status: Former Smoker    Types: Cigars, Cigarettes  . Smokeless tobacco: Never Used  . Tobacco comment: smokes every couple of months    Vaping Use  . Vaping Use: Every day  Substance Use Topics  . Alcohol use: Yes    Alcohol/week: 6.0 standard drinks    Types: 6 Standard drinks or equivalent per week  . Drug use: Yes    Types: Marijuana     Allergies   Amoxicillin and Penicillins   Review of Systems Review of Systems Per HPI  Physical Exam Triage Vital Signs ED Triage Vitals  Enc Vitals Group     BP 08/31/20 2010 120/72     Pulse Rate 08/31/20 2010 92     Resp 08/31/20 2010 17     Temp 08/31/20 2010 97.8 F (36.6 C)     Temp src --      SpO2 08/31/20 2010 99 %     Weight --      Height --      Head Circumference --      Peak Flow --      Pain Score 08/31/20 2008 10     Pain Loc --      Pain Edu? --      Excl. in [place]? --    No data found.  Updated Vital Signs BP 120/72 (BP Location: Left Arm)   Pulse 92   Temp 97.8 F (36.6 C)   Resp 17   SpO2 99%   Visual Acuity Right Eye Distance:   Left Eye Distance:   Bilateral Distance:    Right Eye Near:   Left Eye Near:    Bilateral Near:     Physical Exam Vitals and nursing note reviewed. Exam conducted with a chaperone present.  Constitutional:      Appearance: Normal appearance.  HENT:     Head: Atraumatic.  Eyes:     Extraocular Movements: Extraocular movements intact.     Conjunctiva/sclera: Conjunctivae normal.  Cardiovascular:     Rate and Rhythm: Normal rate and regular rhythm.  Pulmonary:     Effort: Pulmonary effort is normal.     Breath sounds: Normal breath sounds.  Genitourinary:   Musculoskeletal:        General: Normal range of motion.     Cervical back: Normal range of motion and neck supple.   Skin:    General: Skin is warm and dry.  Neurological:     General: No focal deficit present.     Mental Status: He is oriented to person, place, and time.  Psychiatric:  Mood and Affect: Mood normal.        Thought Content: Thought content normal.        Judgment: Judgment normal.     UC Treatments / Results  Labs (all labs ordered are listed, but only abnormal results are displayed) Labs Reviewed - No data to display  EKG   Radiology No results found.  Procedures Procedures (including critical care time)  Medications Ordered in UC Medications - No data to display  Initial Impression / Assessment and Plan / UC Course  I have reviewed the triage vital signs and the nursing notes.  Pertinent labs & imaging results that were available during my care of the patient were reviewed by me and considered in my medical decision making (see chart for details).     Afebrile, no significant area of fluctuance, induration so do not feel I&D is indicated today.  Pain is significantly poorly controlled, will give small supply of Norco for as needed use for this and start on Bactrim, Hibiclens and continue warm compresses.  General surgery information given for follow-up given recurrence of same issue.  Strict ED precautions given for significantly worsening symptoms in the meantime.  Work note given.  Final Clinical Impressions(s) / UC Diagnoses   Final diagnoses:  Anal pain  Abscess   Discharge Instructions   None    ED Prescriptions    Medication Sig Dispense Auth. Provider   sulfamethoxazole-trimethoprim (BACTRIM DS) 800-160 MG tablet Take 1 tablet by mouth 2 (two) times daily. 20 tablet Volney American, [place]   chlorhexidine (HIBICLENS) 4 % external liquid Apply topically daily as needed. 120 mL Volney American, [place]   HYDROcodone-acetaminophen (NORCO/VICODIN) 5-325 MG tablet Take 1 tablet by mouth 2 (two) times daily as needed for moderate pain. 10  tablet Volney American, [place]     I have reviewed the PDMP during this encounter.   Volney American, [place] 08/31/20 2050

## 2020-08-31 NOTE — ED Triage Notes (Addendum)
Pt is here with an abscess that started a week ago, pt has not taken any meds to relieve discomfort. Pt states he went to a GI doctor 3 days ago because of his constant abscess(s) and states the doctor said he may have Proctitis.

## 2020-09-10 ENCOUNTER — Other Ambulatory Visit: Payer: Self-pay | Admitting: Internal Medicine

## 2020-09-10 DIAGNOSIS — B20 Human immunodeficiency virus [HIV] disease: Secondary | ICD-10-CM

## 2020-09-10 NOTE — Progress Notes (Signed)
Agree with the assessment and plan as outlined by Jessica Zehr, PA-C. ? ?Saliah Crisp, DO, FACG ? ?

## 2020-10-02 ENCOUNTER — Encounter: Payer: Self-pay | Admitting: Gastroenterology

## 2020-10-07 ENCOUNTER — Ambulatory Visit (AMBULATORY_SURGERY_CENTER): Payer: Managed Care, Other (non HMO) | Admitting: Gastroenterology

## 2020-10-07 ENCOUNTER — Encounter: Payer: Self-pay | Admitting: Gastroenterology

## 2020-10-07 ENCOUNTER — Other Ambulatory Visit: Payer: Self-pay

## 2020-10-07 VITALS — BP 137/86 | HR 70 | Temp 98.3°F | Resp 12 | Ht 69.0 in | Wt 124.0 lb

## 2020-10-07 DIAGNOSIS — K625 Hemorrhage of anus and rectum: Secondary | ICD-10-CM

## 2020-10-07 DIAGNOSIS — K602 Anal fissure, unspecified: Secondary | ICD-10-CM | POA: Diagnosis not present

## 2020-10-07 DIAGNOSIS — R140 Abdominal distension (gaseous): Secondary | ICD-10-CM

## 2020-10-07 DIAGNOSIS — R195 Other fecal abnormalities: Secondary | ICD-10-CM

## 2020-10-07 DIAGNOSIS — D125 Benign neoplasm of sigmoid colon: Secondary | ICD-10-CM

## 2020-10-07 DIAGNOSIS — K5289 Other specified noninfective gastroenteritis and colitis: Secondary | ICD-10-CM

## 2020-10-07 DIAGNOSIS — K635 Polyp of colon: Secondary | ICD-10-CM

## 2020-10-07 DIAGNOSIS — K5900 Constipation, unspecified: Secondary | ICD-10-CM

## 2020-10-07 MED ORDER — AMBULATORY NON FORMULARY MEDICATION: 0 refills | Status: DC

## 2020-10-07 MED ORDER — SODIUM CHLORIDE 0.9 % IV SOLN: 500.0000 mL | Freq: Once | INTRAVENOUS | Status: DC

## 2020-10-07 MED ORDER — FLEET ENEMA 7-19 GM/118ML RE ENEM
1.0000 | ENEMA | Freq: Once | RECTAL | Status: AC
  Administered 2020-10-07: 1 via RECTAL

## 2020-10-07 NOTE — Progress Notes (Signed)
Report to PACU, RN, vss, BBS= Clear.  

## 2020-10-07 NOTE — Patient Instructions (Signed)
HANDOUTS PROVIDED ON: POLYPS  The polyp removed/biopsies taken today have been sent for pathology.  The results can take 1-3 weeks to receive.  When your next colonoscopy should occur will be based on the pathology results.    You may resume your previous diet and medication schedule.  Take the written prescription provided today to:  [facility] information is below:  Address: [address] [place], [place] [address]  [phone]   It may take a day or 2 to fill this because it is a compound medication and can only be    Thank you for allowing [place] to care for you today!!!   YOU HAD AN ENDOSCOPIC PROCEDURE TODAY AT [facility]:   Refer to the procedure report that was given to you for any specific questions about what was found during the examination.  If the procedure report does not answer your questions, please call your gastroenterologist to clarify.  If you requested that your care partner not be given the details of your procedure findings, then the procedure report has been included in a sealed envelope for you to review at your convenience later.  YOU SHOULD EXPECT: Some feelings of bloating in the abdomen. Passage of more gas than usual.  Walking can help get rid of the air that was put into your GI tract during the procedure and reduce the bloating. If you had a lower endoscopy (such as a colonoscopy or flexible sigmoidoscopy) you may notice spotting of blood in your stool or on the toilet paper. If you underwent a bowel prep for your procedure, you may not have a normal bowel movement for a few days.  Please Note:  You might notice some irritation and congestion in your nose or some drainage.  This is from the oxygen used during your procedure.  There is no need for concern and it should clear up in a day or so.  SYMPTOMS TO REPORT IMMEDIATELY:   Following lower endoscopy (colonoscopy or flexible sigmoidoscopy):  Excessive amounts of blood  in the stool  Significant tenderness or worsening of abdominal pains  Swelling of the abdomen that is new, acute  Fever of 100F or higher  For urgent or emergent issues, a gastroenterologist can be reached at any hour by calling [phone]. Do not use MyChart messaging for urgent concerns.    DIET:  We do recommend a small meal at first, but then you may proceed to your regular diet.  Drink plenty of fluids but you should avoid alcoholic beverages for 24 hours.  ACTIVITY:  You should plan to take it easy for the rest of today and you should NOT DRIVE or use heavy machinery until tomorrow (because of the sedation medicines used during the test).    FOLLOW UP: Our staff will call the number listed on your records Friday morning between 7:15 am and 8:15 am following your procedure to check on you and address any questions or concerns that you may have regarding the information given to you following your procedure. If we do not reach you, we will leave a message.  We will attempt to reach you two times.  During this call, we will ask if you have developed any symptoms of COVID 19. If you develop any symptoms (ie: fever, flu-like symptoms, shortness of breath, cough etc.) before then, please call [phone].  If you test positive for Covid 19 in the 2 weeks post procedure, please call and report this information to  [place].    If any biopsies were taken you will be contacted by phone or by letter within the next 1-3 weeks.  Please call us at [phone] if you have not heard about the biopsies in 3 weeks.    SIGNATURES/CONFIDENTIALITY: You and/or your care partner have signed paperwork which will be entered into your electronic medical record.  These signatures attest to the fact that that the information above on your After Visit Summary has been reviewed and is understood.  Full responsibility of the confidentiality of this discharge information lies with you and/or your care-partner.

## 2020-10-07 NOTE — Progress Notes (Signed)
VS taken by C.W. 

## 2020-10-07 NOTE — Progress Notes (Signed)
Called to room to assist during endoscopic procedure.  Patient ID and intended procedure confirmed with present staff. Received instructions for my participation in the procedure from the performing physician.  

## 2020-10-07 NOTE — Op Note (Signed)
[facility] Patient Name: Devon Hernandez Procedure Date: 10/07/2020 1:24 PM MRN: [id number] Endoscopist: Gerrit Heck , MD Age: 25 Referring MD:  Date of Birth: 1996-01-10 Gender: Male Account #: [id number] Procedure:                Colonoscopy Indications:              Generalized abdominal pain, Hematochezia, Change in                            stool caliber, Constipation, Mucus like stools Medicines:                Monitored Anesthesia Care Procedure:                Pre-Anesthesia Assessment:                           - Prior to the procedure, a History and Physical                            was performed, and patient medications and                            allergies were reviewed. The patient's tolerance of                            previous anesthesia was also reviewed. The risks                            and benefits of the procedure and the sedation                            options and risks were discussed with the patient.                            All questions were answered, and informed consent                            was obtained. Prior Anticoagulants: The patient has                            taken no previous anticoagulant or antiplatelet                            agents. ASA Grade Assessment: III - A patient with                            severe systemic disease. After reviewing the risks                            and benefits, the patient was deemed in                            satisfactory condition to undergo the procedure.  After obtaining informed consent, the colonoscope                            was passed under direct vision. Throughout the                            procedure, the patient's blood pressure, pulse, and                            oxygen saturations were monitored continuously. The                            Olympus [id number]) [id number] was introduced                            through the  anus and advanced to the 10 cm into the                            ileum. The colonoscopy was performed without                            difficulty. The patient tolerated the procedure                            well. The quality of the bowel preparation was                            good. The terminal ileum, ileocecal valve,                            appendiceal orifice, and rectum were photographed. Scope In: 1:35:13 PM Scope Out: 1:54:57 PM Scope Withdrawal Time: 0 hours 16 minutes 18 seconds  Total Procedure Duration: 0 hours 19 minutes 44 seconds  Findings:                 A small, internal anal fissure was found on                            perianal exam.                           A 4 mm polyp was found in the sigmoid colon. The                            polyp was sessile. The polyp was removed with a                            cold snare. Resection and retrieval were complete.                            Estimated blood loss was minimal.                           Localized mild inflammation characterized by  congestion (edema) and erythema was found in the                            distal rectum. This transitioned to normal mucosa                            approximaltely 7 cm from the anal verge. Biopsies                            were taken with a cold forceps for histology.                            Estimated blood loss was minimal.                           The recto-sigmoid colon, sigmoid colon, descending                            colon, transverse colon, ascending colon and cecum                            appeared otherwise all appeared normal.                           Localized inflammation, mild in severity and                            characterized by congestion (edema) and erythema                            was found in the terminal ileum. This was located                            approximately 5 cm from the ICV. Biopsies were                             taken with a cold forceps for histology. Estimated                            blood loss was minimal. The colonoscope was                            introduced another 5-7 cm proximal to this point,                            and the remainder of the ileum was normal appearing. Complications:            No immediate complications. Estimated Blood Loss:     Estimated blood loss was minimal. Impression:               - Anal fissure found on perianal exam.                           - One 4 mm polyp in  the sigmoid colon, removed with                            a cold snare. Resected and retrieved.                           - Localized mild inflammation was found in the                            distal rectum. Biopsied.                           - The recto-sigmoid colon, sigmoid colon,                            descending colon, transverse colon, ascending colon                            and cecum are normal.                           - Localized area of mild inflammation in the distal                            ileum. This was biopsied. The mucosa distal and                            proximal to this area was otherwise normal                            appearing. Recommendation:           - Patient has a contact number available for                            emergencies. The signs and symptoms of potential                            delayed complications were discussed with the                            patient. Return to normal activities tomorrow.                            Written discharge instructions were provided to the                            patient.                           - Resume previous diet.                           - Continue present medications.                           - Await pathology  results.                           - Repeat colonoscopy for surveillance based on                            pathology results.                            - Return to GI office at appointment to be                            scheduled.                           - Start topical Nitroglycerine 0.125%. Apply a                            pea-sized amount to the affected area twice daily                            for 6-8 weeks. Avoid strenuous activity/exercise                            within 30 minutes of application.                           - Sitz baths as needed. Gerrit Heck, MD 10/07/2020 2:03:50 PM

## 2020-10-09 ENCOUNTER — Telehealth: Payer: Self-pay

## 2020-10-09 ENCOUNTER — Telehealth: Payer: Self-pay | Admitting: *Deleted

## 2020-10-09 NOTE — Telephone Encounter (Signed)
Per 10/07/20 procedure note - Return to office at appointment to be scheduled.  Staff message has been sent to schedulers to schedule follow up appt.

## 2020-10-09 NOTE — Telephone Encounter (Signed)
First post procedure follow up call, no answer 

## 2020-10-09 NOTE — Telephone Encounter (Signed)
  Follow up Call-  Call back number 10/07/2020  Post procedure Call Back phone  # [phone]  Permission to leave phone message Yes  Some recent data might be hidden    LMOM to call back with any questions or concerns.  Also, call back if patient has developed fever, respiratory issues or been dx with COVID or had any family members or close contacts diagnosed since her procedure.

## 2020-10-14 ENCOUNTER — Telehealth: Payer: Self-pay | Admitting: General Surgery

## 2020-10-14 NOTE — Telephone Encounter (Signed)
Left  detailed message for the patient to contact the office regarding his egd/colon

## 2020-10-14 NOTE — Telephone Encounter (Signed)
-----  Message from [place], DO sent at 10/14/2020  8:32 AM EDT ----- Biopsies from the recent colonoscopy were notable for the following: - The polyp removed was a benign Hyperplastic Polyp.  This type of polyp harbors no malignant potential. - The biopsies from the distal rectum were benign. - Biopsies from the ileum (small intestine) demonstrate nonspecific chronic inflammatory changes.  This can be from nonsteroidal anti-inflammatory medications (NSAIDs).  While early, mild Crohn's Disease can also have this appearance, there were no granulomas or other hallmark features, and presenting clinical symptoms seem inconsistent for small bowel Crohn's Disease.  Therefore, holding off on starting course of IBD medication at this time.  However, if symptoms persist, may trial course of budesonide.  In the meantime, stop all NSAIDs, check ESR/CRP (previously ordered and April), and plan for follow-up in the GI clinic in 3 months or sooner as needed.  Colonoscopy recall at age 25.

## 2020-10-23 ENCOUNTER — Other Ambulatory Visit: Payer: Self-pay

## 2020-10-23 ENCOUNTER — Ambulatory Visit (HOSPITAL_COMMUNITY)
Admission: EM | Admit: 2020-10-23 | Discharge: 2020-10-23 | Disposition: A | Discharge status: Home / Self Care | Payer: Managed Care, Other (non HMO) | Attending: Urgent Care | Admitting: Urgent Care

## 2020-10-23 ENCOUNTER — Encounter (HOSPITAL_COMMUNITY): Payer: Self-pay

## 2020-10-23 DIAGNOSIS — B20 Human immunodeficiency virus [HIV] disease: Secondary | ICD-10-CM | POA: Diagnosis not present

## 2020-10-23 DIAGNOSIS — K602 Anal fissure, unspecified: Secondary | ICD-10-CM | POA: Diagnosis not present

## 2020-10-23 DIAGNOSIS — K610 Anal abscess: Secondary | ICD-10-CM | POA: Diagnosis not present

## 2020-10-23 MED ORDER — AMOXICILLIN-POT CLAVULANATE 875-125 MG PO TABS: 1.0000 | ORAL_TABLET | Freq: Two times a day (BID) | ORAL | 0 refills | Status: DC

## 2020-10-23 MED ORDER — HYDROCODONE-ACETAMINOPHEN 5-325 MG PO TABS: 1.0000 | ORAL_TABLET | Freq: Two times a day (BID) | ORAL | 0 refills | Status: DC | PRN

## 2020-10-23 NOTE — Discharge Instructions (Addendum)
Please start Augmentin to help against the infection, peri-anal cellulitis. For pain, schedule Tylenol once every 6-8 hours. If this does not help control your pain, then it is okay to use hydrocodone. This is a narcotic medication, can be abused, can be addictive. If you do not need this medication for pain do not use it. Follow up as soon as possible with [facility] for a consultation regarding the persistent anal fissure and for an evaluation of a peri-rectal abscess.

## 2020-10-23 NOTE — ED Triage Notes (Signed)
Pt c/o  recurring abscess to rectum, has had a colonoscopy about three weeks ago. States he has had the abscess lanced before. Started four days ago, putting lidocaine, warm compress, Preporation H on it, and taking Tylenol for pain with some relief. Patient states he tried to take his moms antibiotic. Pain makes it hard for patient to sit.

## 2020-10-23 NOTE — ED Provider Notes (Signed)
[facility]   MRN: [id number] DOB: [birth year]  Subjective:   Devon Hernandez is a 25 y.o. male presenting for 4-day history of acute onset recurrent perianal pain.  Patient has been dealing with this for the past 1 to 2 months.  Has been managed for rectal abscess with I&D and antibiotic course.  He was supposed to see general surgery for an anal fissure as well but per patient this was canceled as he was referred to a gastroenterologist instead by his PCP.  Reports that he went to the GI specialist, had a colonoscopy and was normal.  He is concerned that he has a recurrent rectal abscess.  Has severe 10 out of 10 pain, difficulty sitting or walking from said pain.  Has been using Preparation H H, lidocaine, Tylenol.  Also started taking antibiotic that his mother had from a leftover course.  He does have a history of HIV and is on Biktarvy.  No current facility-administered medications for this encounter.  Current Outpatient Medications:    AMBULATORY NON FORMULARY MEDICATION, Medication Name: Nitroglycerin 0.125%  Apply a pea-sized amount to the affected area twice daily for 6-8 weeks.  Avoid strenuous activity/exercise within 30 minutes of application, Disp: 30 g, Rfl: 0   BIKTARVY 50-200-25 MG TABS tablet, TAKE 1 TABLET BY MOUTH 1 TIME A DAY., Disp: 30 tablet, Rfl: 5   HYDROcodone-acetaminophen (NORCO/VICODIN) 5-325 MG tablet, Take 1 tablet by mouth 2 (two) times daily as needed for moderate pain., Disp: 10 tablet, Rfl: 0   hydrocortisone (ANUSOL-HC) 2.5 % rectal cream, Place 1 application rectally 2 (two) times daily., Disp: 30 g, Rfl: 0   hydrOXYzine (ATARAX/VISTARIL) 25 MG tablet, Take 25 mg by mouth 3 (three) times daily as needed., Disp: , Rfl:    lamoTRIgine (LAMICTAL) 150 MG tablet, Take 150 mg by mouth 2 (two) times daily., Disp: , Rfl:    lithium carbonate 300 MG capsule, Take 300 mg by mouth 2 (two) times daily with a meal., Disp: , Rfl:    mirtazapine (REMERON) 15  MG tablet, Take 15 mg by mouth at bedtime., Disp: , Rfl:    Polyethylene Glycol 3350 (MIRALAX PO), Take by mouth., Disp: , Rfl:    risperiDONE (RISPERDAL PO), Take by mouth., Disp: , Rfl:    sulfamethoxazole-trimethoprim (BACTRIM DS) 800-160 MG tablet, Take 1 tablet by mouth 2 (two) times daily., Disp: 20 tablet, Rfl: 0   Allergies  Allergen Reactions   Amoxicillin Hives and Diarrhea   Penicillins     Per Dad; pt states was recently prescribed PCN "and I didn't have a reaction to it"    Past Medical History:  Diagnosis Date   ADHD (attention deficit hyperactivity disorder)    Allergy    Anxiety    Bipolar disorder (HCC)    Depression    Headache(784.0)    HIV (human immunodeficiency virus infection) ([place]) 07/05/2018   Immune deficiency disorder ([place])    Rectal abscess      Past Surgical History:  Procedure Laterality Date   TYMPANOSTOMY TUBE PLACEMENT      Family History  Problem Relation Age of Onset   Depression Mother    Hypertension Mother    Depression Father        Hx: PTSD   Diabetes Maternal Aunt    AVM Maternal Grandmother    Aneurysm Maternal Grandmother    Seizures Maternal Grandmother    Stomach cancer Neg Hx    Pancreatic cancer Neg  Hx    Colon cancer Neg Hx    Liver disease Neg Hx    Esophageal cancer Neg Hx    Rectal cancer Neg Hx     Social History   Tobacco Use   Smoking status: Former    Pack years: 0.00    Types: Cigars, Cigarettes   Smokeless tobacco: Never   Tobacco comments:    smokes every couple of months    Vaping Use   Vaping Use: Every day  Substance Use Topics   Alcohol use: Yes    Alcohol/week: 6.0 standard drinks    Types: 6 Standard drinks or equivalent per week   Drug use: Yes    Types: Marijuana    ROS   Objective:   Vitals: BP 103/85 (BP Location: Left Arm)   Pulse 95   Temp 98.3 F (36.8 C) (Oral)   Resp 20   SpO2 100%   Physical Exam Constitutional:      General: He is not in acute distress.     Appearance: Normal appearance. He is well-developed and normal weight. He is not ill-appearing, toxic-appearing or diaphoretic.  HENT:     Head: Normocephalic and atraumatic.     Right Ear: External ear normal.     Left Ear: External ear normal.     Nose: Nose normal.     Mouth/Throat:     Pharynx: Oropharynx is clear.  Eyes:     General: No scleral icterus.       Right eye: No discharge.        Left eye: No discharge.     Extraocular Movements: Extraocular movements intact.     Pupils: Pupils are equal, round, and reactive to light.  Cardiovascular:     Rate and Rhythm: Normal rate.  Pulmonary:     Effort: Pulmonary effort is normal.  Genitourinary:    Rectum: Tenderness and anal fissure present. No external hemorrhoid.    Musculoskeletal:     Cervical back: Normal range of motion.  Skin:    General: Skin is warm and dry.  Neurological:     Mental Status: He is alert and oriented to person, place, and time.  Psychiatric:        Mood and Affect: Mood normal.        Behavior: Behavior normal.        Thought Content: Thought content normal.        Judgment: Judgment normal.      Assessment and Plan :   I have reviewed the PDMP during this encounter.  1. Perianal cellulitis   2. Anal fissure     I corrected patient's allergy medications as he has taken amoxicillin before and we discussed that he would benefit from a course of Augmentin to address what I suspect is perianal cellulitis.  Physical exam findings do not show any particular perianal abscess and I cannot evaluate for rectal abscess.  However, I have low suspicion for rectal abscess.  Recommended pain control with Tylenol, hydrocodone as needed for severe pain.  Emphasized need for follow-up with a general surgeon as previously planned especially since he is already completed his consultation with gastroenterology.  Follow-up with PCP otherwise. Counseled patient on potential for adverse effects with medications  prescribed/recommended today, ER and return-to-clinic precautions discussed, patient verbalized understanding.    Jaynee Eagles, PA-C 10/24/20 1019

## 2020-10-27 NOTE — Telephone Encounter (Signed)
Tried to contact the patient, the phone rang one time and then went to a busy signal. Will send a mychart message.

## 2021-02-16 ENCOUNTER — Ambulatory Visit: Payer: Managed Care, Other (non HMO) | Admitting: Internal Medicine

## 2021-02-26 ENCOUNTER — Ambulatory Visit: Payer: Medicaid Other | Admitting: Internal Medicine

## 2021-03-29 ENCOUNTER — Other Ambulatory Visit: Payer: Self-pay | Admitting: Internal Medicine

## 2021-03-29 DIAGNOSIS — B20 Human immunodeficiency virus [HIV] disease: Secondary | ICD-10-CM

## 2021-03-30 NOTE — Telephone Encounter (Signed)
Appointment 11/30

## 2021-03-31 ENCOUNTER — Ambulatory Visit (INDEPENDENT_AMBULATORY_CARE_PROVIDER_SITE_OTHER): Payer: 59 | Admitting: Internal Medicine

## 2021-03-31 ENCOUNTER — Encounter: Payer: Self-pay | Admitting: Internal Medicine

## 2021-03-31 ENCOUNTER — Other Ambulatory Visit: Payer: Self-pay

## 2021-03-31 ENCOUNTER — Other Ambulatory Visit (HOSPITAL_COMMUNITY)
Admission: RE | Admit: 2021-03-31 | Discharge: 2021-03-31 | Disposition: A | Discharge status: Home / Self Care | Payer: Managed Care, Other (non HMO) | Source: Ambulatory Visit | Attending: Internal Medicine | Admitting: Internal Medicine

## 2021-03-31 VITALS — BP 122/76 | HR 73 | Temp 98.1°F | Wt 133.0 lb

## 2021-03-31 DIAGNOSIS — Z113 Encounter for screening for infections with a predominantly sexual mode of transmission: Secondary | ICD-10-CM | POA: Insufficient documentation

## 2021-03-31 DIAGNOSIS — F3289 Other specified depressive episodes: Secondary | ICD-10-CM | POA: Diagnosis not present

## 2021-03-31 DIAGNOSIS — K5900 Constipation, unspecified: Secondary | ICD-10-CM

## 2021-03-31 DIAGNOSIS — F32A Depression, unspecified: Secondary | ICD-10-CM | POA: Insufficient documentation

## 2021-03-31 DIAGNOSIS — B20 Human immunodeficiency virus [HIV] disease: Secondary | ICD-10-CM | POA: Insufficient documentation

## 2021-03-31 NOTE — Assessment & Plan Note (Signed)
He continues on Biktarvy and no concerns, no indication for change.  Labs today and he can rtc in 6 months.
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
## 2021-03-31 NOTE — Assessment & Plan Note (Signed)
Seems to be situational with recent events, though did not elaborate.  I discussed counseling options and he is in agreement to meet with our counselor.

## 2021-03-31 NOTE — Assessment & Plan Note (Signed)
Resolved and he is feeling better now, no new concerns.

## 2021-03-31 NOTE — Assessment & Plan Note (Signed)
Will screen today 

## 2021-04-01 LAB — CYTOLOGY, (ORAL, ANAL, URETHRAL) ANCILLARY ONLY
Chlamydia: NEGATIVE
Chlamydia: NEGATIVE
Comment: NEGATIVE
Comment: NEGATIVE
Comment: NORMAL
Comment: NORMAL
Neisseria Gonorrhea: NEGATIVE
Neisseria Gonorrhea: NEGATIVE

## 2021-04-01 LAB — URINE CYTOLOGY ANCILLARY ONLY
Chlamydia: NEGATIVE
Comment: NEGATIVE
Comment: NORMAL
Neisseria Gonorrhea: NEGATIVE

## 2021-04-02 LAB — RPR TITER: RPR Titer: 1:4 {titer} — ABNORMAL HIGH

## 2021-04-02 LAB — RPR: RPR Ser Ql: REACTIVE — AB

## 2021-04-02 LAB — T-HELPER CELL (CD4) - (RCID CLINIC ONLY)
CD4 % Helper T Cell: 42 % (ref 33–65)
CD4 T Cell Abs: 1235 /uL (ref 400–1790)

## 2021-04-02 LAB — HIV-1 RNA QUANT-NO REFLEX-BLD
HIV 1 RNA Quant: 28 Copies/mL — ABNORMAL HIGH
HIV-1 RNA Quant, Log: 1.45 Log cps/mL — ABNORMAL HIGH

## 2021-04-02 LAB — FLUORESCENT TREPONEMAL AB(FTA)-IGG-BLD: Fluorescent Treponemal ABS: REACTIVE — AB

## 2021-04-06 NOTE — Telephone Encounter (Signed)
Spoke with patient, he confirms that he is taking oxcarbazepine 300 mg daily which is being prescribed from [facility].   He reports increased fatigue, appetite, and bloating/cramping ever since he started the oxcarbazepine - although his mood is improved. Advised that he reach out to his prescriber at [facility] in regards to these symptoms.   Routing to pharmacy to review DDI between [place] and oxcarbazepine.   Beryle Flock, RN

## 2021-04-07 NOTE — Telephone Encounter (Signed)
Called patient to offer appointment, no answer. Left HIPAA compliant voicemail requesting callback.   Foday Cone D Rejeana Fadness, RN  

## 2021-04-07 NOTE — Telephone Encounter (Signed)
Yes, it is contraindicated to take together. He will need an ART change. Can he be scheduled with either Estill Bamberg or I to discuss? Thank you!

## 2021-04-07 NOTE — Telephone Encounter (Signed)
Thank you for the info.

## 2021-04-07 NOTE — Telephone Encounter (Signed)
Patient accepts appointment with pharmacy tomorrow 12/8.   Beryle Flock, RN

## 2021-04-07 NOTE — Telephone Encounter (Signed)
Received call from [facility] wanting to make sure provider is aware of DDI. Returned call and relayed that office is aware and patient is scheduled to come in tomorrow to discuss possible medication change.    Beryle Flock, RN

## 2021-04-08 ENCOUNTER — Ambulatory Visit: Payer: Medicaid Other | Admitting: Pharmacist

## 2021-04-08 ENCOUNTER — Ambulatory Visit: Payer: Medicaid Other

## 2021-04-13 ENCOUNTER — Other Ambulatory Visit: Payer: Self-pay

## 2021-04-13 ENCOUNTER — Ambulatory Visit (INDEPENDENT_AMBULATORY_CARE_PROVIDER_SITE_OTHER): Payer: Medicaid Other | Admitting: Pharmacist

## 2021-04-13 DIAGNOSIS — B20 Human immunodeficiency virus [HIV] disease: Secondary | ICD-10-CM

## 2021-04-13 NOTE — Progress Notes (Signed)
04/13/2021  HPI: Devon Hernandez is a 25 y.o. male who presents to the [facility] clinic for HIV follow-up.  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  Avoid strenuous activity/exercise within 30 minutes of application   BIKTARVY 26-712-45 MG TABS TABLET    TAKE 1 TABLET BY MOUTH 1 TIME A DAY.   HYDROCORTISONE (ANUSOL-HC) 2.5 % RECTAL CREAM    Place 1 application rectally 2 (two) times daily.   HYDROXYZINE (ATARAX/VISTARIL) 25 MG TABLET    Take 25 mg by mouth 3 (three) times daily as needed.   LAMOTRIGINE (LAMICTAL) 150 MG TABLET    Take 150 mg by mouth 2 (two) times daily.   LITHIUM CARBONATE 300 MG CAPSULE    Take 300 mg by mouth 2 (two) times daily with a meal.   MIRTAZAPINE (REMERON) 15 MG TABLET    Take 15 mg by mouth at bedtime.   OXCARBAZEPINE (TRILEPTAL) 300 MG TABLET    Take 300 mg by mouth daily.   POLYETHYLENE GLYCOL 3350 (MIRALAX PO)    Take by mouth.  Modified Medications   No  medications on file  Discontinued Medications   No medications on file    Allergies: Allergies  Allergen Reactions   Penicillins     Per Dad; pt states was recently prescribed PCN "and I didn't have a reaction to it"    Past Medical History: Past Medical History:  Diagnosis Date   ADHD (attention deficit hyperactivity disorder)    Allergy    Anxiety    Bipolar disorder (HCC)    Depression    Headache(784.0)    HIV (human immunodeficiency virus infection) ([place]) 07/05/2018   Immune deficiency disorder ([place])    Rectal abscess     Social History: Social History   Socioeconomic History   Marital status: Single    Spouse name: Not on file   Number of children: Not on file   Years of education: Not on file   Highest education level: Not on file  Occupational History   Not on file  Tobacco Use   Smoking status: Every Day    Types: Cigars   Smokeless tobacco: Never   Tobacco comments:    smokes every couple of months    Vaping Use   Vaping Use: Every day  Substance and Sexual Activity   Alcohol use: Yes    Alcohol/week: 6.0 standard drinks    Types: 6 Standard drinks or equivalent per week   Drug use: Yes    Types: Marijuana   Sexual activity: Yes  Comment: given condoms   Other Topics Concern   Not on file  Social History Narrative   Mom currently has full custody but Devon Hernandez has gone to live with his dad. Tonight was the first night that he was at dad's house. He admitted to marijuana use tonight, unclear when. Smoke exposure at home. No pets at home.   Social Determinants of Health   Financial Resource Strain: Not on file  Food Insecurity: Not on file  Transportation Needs: Not on file  Physical Activity: Not on file  Stress: Not on file  Social Connections: Not on file    Labs: Lab Results  Component Value Date   HIV1RNAQUANT 28 (H) 03/31/2021   HIV1RNAQUANT Not Detected 07/29/2020   HIV1RNAQUANT <20 02/12/2020   CD4TABS 1,235 03/31/2021   CD4TABS  1,324 07/29/2020   CD4TABS 1,250 02/12/2020    RPR and STI Lab Results  Component Value Date   LABRPR REACTIVE (A) 03/31/2021   LABRPR REACTIVE (A) 07/29/2020   LABRPR REACTIVE (A) 02/12/2020   LABRPR REACTIVE (A) 04/22/2019   LABRPR REACTIVE (A) 07/05/2018   RPRTITER 1:4 (H) 03/31/2021   RPRTITER 1:4 (H) 07/29/2020   RPRTITER 1:4 (H) 02/12/2020   RPRTITER 1:8 (H) 04/22/2019   RPRTITER 1:32 (H) 07/05/2018    STI Results GC CT  03/31/2021 Negative Negative  03/31/2021 Negative Negative  03/31/2021 Negative Negative  07/29/2020 Negative Negative  02/12/2020 Negative Negative  02/12/2020 Negative Positive(A)  02/12/2020 Negative Negative  04/22/2019 Negative Negative  04/22/2019 Negative Negative  04/22/2019 Negative Negative  09/05/2018 Negative Negative  02/28/2017 Negative Negative  12/24/2015 Negative Negative    Hepatitis B Lab Results  Component Value Date   HEPBSAB NON-REACTIVE 02/17/2020   HEPBSAG NON-REACTIVE 02/17/2020   HEPBCAB NON-REACTIVE 02/17/2020   Hepatitis C Lab Results  Component Value Date   HEPCAB NON-REACTIVE 07/05/2018   Hepatitis A Lab Results  Component Value Date   HAV NON-REACTIVE 07/05/2018   Lipids: Lab Results  Component Value Date   CHOL 121 07/29/2020   TRIG 110 07/29/2020   HDL 34 (L) 07/29/2020   CHOLHDL 3.6 07/29/2020   VLDL 14 01/19/2013   LDLCALC 67 07/29/2020    Current HIV Regimen: Biktarvy  Assessment: Devon Hernandez presents to clinic today to discuss drug-drug interactions with his current HIV regimen, Biktarvy. Our records show that he is prescribed oxcarbazepine which is contraindicated with Biktarvy. Devon Hernandez said that he took oxcarbazepine for less than 1 month due to a lot of side effects that he had while on it. He said that he is also no longer taking lithium or lamotrigine. He reports that he is only taking mirtazapine, hydroxyzine, and Biktarvy. I counseled him that we will keep him on Biktarvy for now given that he is  no longer on the contraindicating medication. He has an appointment with the prescribing PCP next month. I told him to let us know if any new medications are started at this visit.  Plan: Follow up with Dr. Linus Salmons on 09/29/21 @ 10:45 Call if any new questions/concerns or new medications  Lestine Box, PharmD PGY2 Infectious Diseases Pharmacy Resident

## 2021-04-19 ENCOUNTER — Ambulatory Visit (HOSPITAL_COMMUNITY)
Admission: EM | Admit: 2021-04-19 | Discharge: 2021-04-19 | Disposition: A | Discharge status: Home / Self Care | Payer: Managed Care, Other (non HMO) | Attending: Internal Medicine | Admitting: Internal Medicine

## 2021-04-19 ENCOUNTER — Other Ambulatory Visit: Payer: Self-pay

## 2021-04-19 ENCOUNTER — Encounter (HOSPITAL_COMMUNITY): Payer: Self-pay | Admitting: Emergency Medicine

## 2021-04-19 DIAGNOSIS — L02411 Cutaneous abscess of right axilla: Secondary | ICD-10-CM

## 2021-04-19 MED ORDER — DOXYCYCLINE HYCLATE 100 MG PO CAPS: 100.0000 mg | ORAL_CAPSULE | Freq: Two times a day (BID) | ORAL | 0 refills | Status: AC

## 2021-04-19 MED ORDER — KETOROLAC TROMETHAMINE 30 MG/ML IJ SOLN: 30.0000 mg | Freq: Once | INTRAMUSCULAR | Status: DC

## 2021-04-19 NOTE — ED Triage Notes (Signed)
Pt presents with abscess under right arm xs 2 weeks.

## 2021-04-19 NOTE — Discharge Instructions (Addendum)
Continue warm compress Take antibiotics as prescribed Tylenol as needed for pain Return to urgent care if symptoms worsen.

## 2021-04-19 NOTE — ED Provider Notes (Signed)
[facility]    CSN: [id number] Arrival date & time: 04/19/21  1739      History   Chief Complaint Chief Complaint  Patient presents with   Abscess    HPI Devon Hernandez is a 25 y.o. male with a history of recurrent folliculitis and gluteal abscesses comes to urgent care complaining of painful swelling in the right axilla over the past couple of weeks.  Patient says symptoms started insidiously and has been persistent.  He has been applying alcohol to aid.  The swelling started draining purulent material a few days ago.  He continues to be painful.  Patient has a history of HIV on Biktarvy.  He has a fever of 100.2 Fahrenheit.  He has been taking amoxicillin at home with no improvement in his symptoms.  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 He has refused pilonidal surgery in the past.   HPI  Past Medical History:  Diagnosis Date   ADHD (attention deficit hyperactivity disorder)    Allergy    Anxiety    Bipolar disorder ([place])    Depression    Headache(784.0)    HIV (human immunodeficiency virus infection) ([place]) 07/05/2018   Immune deficiency disorder [facility])    Rectal abscess     Patient Active Problem List   Diagnosis Date Noted   Depression 03/31/2021   Rectal bleeding 08/27/2020   Rectal discharge 08/27/2020   Bloating 08/27/2020   Constipation 08/25/2020   Routine screening for STI (sexually transmitted infection) 04/22/2019   Encounter for long-term (current) use of high-risk medication 04/22/2019   Major depressive disorder, recurrent episode, mild ([place]) 10/13/2018   Human immunodeficiency virus (HIV) disease ([place]) 07/23/2018   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 Authorizing Provider  doxycycline (VIBRAMYCIN) 100 MG capsule Take 1 capsule (100 mg total) by mouth 2 (two) times daily for 7 days. 04/19/21 04/26/21 Yes Asalee Barrette, Myrene Galas, MD  AMBULATORY NON FORMULARY MEDICATION Medication Name: Nitroglycerin 0.125%  Apply a pea-sized amount to the affected area twice daily for 6-8 weeks.  Avoid strenuous activity/exercise within 30 minutes of application 7/0/01   Cirigliano, Vito V, DO  BIKTARVY 50-200-25 MG TABS tablet TAKE 1 TABLET BY MOUTH 1 TIME A DAY. 03/30/21   Campbell Riches, MD  hydrocortisone (ANUSOL-HC) 2.5 % rectal cream Place 1 application rectally 2 (two) times daily. 08/22/20   Curatolo, Adam, DO  hydrOXYzine (ATARAX/VISTARIL) 25 MG tablet Take 25 mg by mouth 3 (three) times daily as needed.    [provider]  lithium carbonate 300 MG capsule Take 300 mg by mouth 2 (two) times daily with a meal.    [provider]  mirtazapine (REMERON) 15 MG tablet Take 15 mg by mouth at bedtime.    [provider]  Polyethylene Glycol 3350 (MIRALAX PO) Take by mouth.    [provider]    Family History Family History  Problem Relation Age of Onset   Depression Mother    Hypertension Mother  Depression Father        Hx: PTSD   Diabetes Maternal Aunt    AVM Maternal Grandmother    Aneurysm Maternal Grandmother    Seizures Maternal Grandmother    Stomach cancer Neg Hx    Pancreatic cancer Neg Hx    Colon cancer Neg Hx    Liver disease Neg Hx    Esophageal cancer Neg Hx    Rectal cancer Neg Hx     Social History Social History   Tobacco Use   Smoking status: Every Day    Types: Cigars   Smokeless tobacco: Never   Tobacco comments:    smokes every couple of months    Vaping Use    Vaping Use: Every day  Substance Use Topics   Alcohol use: Yes    Alcohol/week: 6.0 standard drinks    Types: 6 Standard drinks or equivalent per week   Drug use: Yes    Types: Marijuana     Allergies   Penicillins   Review of Systems Review of Systems  Gastrointestinal: Negative.   Musculoskeletal: Negative.   Skin:  Positive for rash and wound. Negative for color change and pallor.    Physical Exam Triage Vital Signs ED Triage Vitals  Enc Vitals Group     BP 04/19/21 1824 118/79     Pulse Rate 04/19/21 1824 87     Resp 04/19/21 1824 17     Temp 04/19/21 1824 100.2 F (37.9 C)     Temp Source 04/19/21 1824 Oral     SpO2 04/19/21 1824 100 %     Weight --      Height --      Head Circumference --      Peak Flow --      Pain Score 04/19/21 1821 9     Pain Loc --      Pain Edu? --      Excl. in [place]? --    No data found.  Updated Vital Signs BP 118/79 (BP Location: Left Arm)    Pulse 87    Temp 100.2 F (37.9 C) (Oral)    Resp 17    SpO2 100%   Visual Acuity Right Eye Distance:   Left Eye Distance:   Bilateral Distance:    Right Eye Near:   Left Eye Near:    Bilateral Near:     Physical Exam Vitals and nursing note reviewed.  Constitutional:      General: He is in acute distress.     Appearance: He is not ill-appearing.  Cardiovascular:     Rate and Rhythm: Normal rate and regular rhythm.  Genitourinary:    Comments: Patient refused rectal evaluation Musculoskeletal:     Comments: Draining abscess in the right axilla.  Minimal induration noted.  Mild surrounding erythema.  Neurological:     Mental Status: He is alert.     UC Treatments / Results  Labs (all labs ordered are listed, but only abnormal results are displayed) Labs Reviewed - No data to display  EKG   Radiology No results found.  Procedures Procedures (including critical care time)  Medications Ordered in UC Medications - No data to display  Initial Impression / Assessment  and Plan / UC Course  I have reviewed the triage vital signs and the nursing notes.  Pertinent labs & imaging results that were available during my care of the patient were reviewed by me and considered in my medical decision making (see chart  for details).     1.  Abscess in the right axilla: Doxycycline 100 mg twice daily x7 days Tylenol as needed for pain Warm compresses recommended Return precautions given Abscess is already draining so no indication for incision and drainage. Final Clinical Impressions(s) / UC Diagnoses   Final diagnoses:  Abscess of axilla, right     Discharge Instructions      Continue warm compress Take antibiotics as prescribed Tylenol as needed for pain Return to urgent care if symptoms worsen.   ED Prescriptions     Medication Sig Dispense Auth. Provider   doxycycline (VIBRAMYCIN) 100 MG capsule Take 1 capsule (100 mg total) by mouth 2 (two) times daily for 7 days. 20 capsule Cearra Portnoy, Myrene Galas, MD      PDMP not reviewed this encounter.   Chase Picket, MD 04/19/21 [phone]

## 2021-05-30 ENCOUNTER — Encounter (HOSPITAL_COMMUNITY): Payer: Self-pay

## 2021-05-30 ENCOUNTER — Other Ambulatory Visit: Payer: Self-pay

## 2021-05-30 ENCOUNTER — Ambulatory Visit (HOSPITAL_COMMUNITY)
Admission: EM | Admit: 2021-05-30 | Discharge: 2021-05-30 | Disposition: A | Discharge status: Home / Self Care | Payer: Medicaid Other | Attending: Physician Assistant | Admitting: Physician Assistant

## 2021-05-30 DIAGNOSIS — L02411 Cutaneous abscess of right axilla: Secondary | ICD-10-CM | POA: Diagnosis not present

## 2021-05-30 MED ORDER — SULFAMETHOXAZOLE-TRIMETHOPRIM 800-160 MG PO TABS
1.0000 | ORAL_TABLET | Freq: Two times a day (BID) | ORAL | 0 refills | Status: AC
Start: 2021-05-30 — End: 2021-06-06

## 2021-05-30 MED ORDER — CEPHALEXIN 500 MG PO CAPS: 500.0000 mg | ORAL_CAPSULE | Freq: Three times a day (TID) | ORAL | 0 refills | Status: DC

## 2021-05-30 NOTE — ED Triage Notes (Signed)
Pt presents with abscess under right arm x 2 weeks.

## 2021-05-30 NOTE — Discharge Instructions (Signed)
Take Keflex 3 times a day for 1 week and Bactrim twice daily for 1 week.  Take these with food.  Continue warm compresses and use Tylenol as needed for pain and fever.  Follow-up with dermatology given recurrent symptoms.  If you have any worsening symptoms including enlarging lesion, fever, nausea/vomiting you should be seen immediately.

## 2021-05-30 NOTE — ED Provider Notes (Signed)
[facility]    CSN: [id number] Arrival date & time: 05/30/21  1400      History   Chief Complaint Chief Complaint  Patient presents with   Abscess    HPI Devon Hernandez is a 26 y.o. male.   Patient presents today with a 2-week history of enlarging abscess in his right axilla.  He has been seen in the past for similar symptoms as recently as 04/19/2021 at which point he was prescribed doxycycline.  He has not seen a dermatologist and denies history of hidradenitis suppurativa.  He has a history of pilonidal cysts as well that has required I&D but is never required I&D of this lesion.  He denies any fever, nausea, vomiting, body aches, weakness, malaise.  He has been applying warm compresses and using topical antibiotic cream without improvement of symptoms.  He does have a history of HIV and is currently on Biktarvy; last helper T-cell count was normal but HIV quant was detectable (03/31/2021).  He denies history of MRSA.   Past Medical History:  Diagnosis Date   ADHD (attention deficit hyperactivity disorder)    Allergy    Anxiety    Bipolar disorder ([place])    Depression    Headache(784.0)    HIV (human immunodeficiency virus infection) ([place]) 07/05/2018   Immune deficiency disorder [facility])    Rectal abscess     Patient Active Problem List   Diagnosis Date Noted   Depression 03/31/2021   Rectal bleeding 08/27/2020   Rectal discharge 08/27/2020   Bloating 08/27/2020   Constipation 08/25/2020   Routine screening for STI (sexually transmitted infection) 04/22/2019   Encounter for long-term (current) use of high-risk medication 04/22/2019   Major depressive disorder, recurrent episode, mild ([place]) 10/13/2018   Human immunodeficiency virus (HIV) disease ([place]) 07/23/2018   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 Authorizing Provider  cephALEXin (KEFLEX) 500 MG capsule Take 1 capsule (500 mg total) by mouth 3 (three) times daily. 05/30/21  Yes Harmoni Lucus K, PA-C  sulfamethoxazole-trimethoprim (BACTRIM DS) 800-160 MG tablet Take 1 tablet by mouth 2 (two) times daily for 7 days. 05/30/21 06/06/21 Yes Sanjuana Mruk, Derry Skill, PA-C  AMBULATORY NON FORMULARY MEDICATION Medication Name: Nitroglycerin 0.125%  Apply a pea-sized amount to the affected area twice daily for 6-8 weeks.  Avoid strenuous activity/exercise within 30 minutes of application 07/05/30   Cirigliano, Vito V, DO  BIKTARVY 50-200-25 MG TABS tablet TAKE 1 TABLET BY MOUTH 1 TIME A DAY. 03/30/21   Campbell Riches, MD  hydrocortisone (ANUSOL-HC) 2.5 % rectal cream Place 1 application rectally 2 (two) times daily. 08/22/20   Curatolo, Adam, DO  hydrOXYzine (ATARAX/VISTARIL) 25 MG tablet Take 25 mg by mouth 3 (three) times daily as needed.    [provider]  lithium carbonate 300 MG capsule Take 300 mg by mouth 2 (two) times daily with a meal.    [provider]  mirtazapine (REMERON) 15 MG tablet Take 15 mg by mouth at bedtime.  [provider]  Polyethylene Glycol 3350 (MIRALAX PO) Take by mouth.    [provider]    Family History Family History  Problem Relation Age of Onset   Depression Mother    Hypertension Mother    Depression Father        Hx: PTSD   Diabetes Maternal Aunt    AVM Maternal Grandmother    Aneurysm Maternal Grandmother    Seizures Maternal Grandmother    Stomach cancer Neg Hx    Pancreatic cancer Neg Hx    Colon cancer Neg Hx    Liver disease Neg Hx    Esophageal  cancer Neg Hx    Rectal cancer Neg Hx     Social History Social History   Tobacco Use   Smoking status: Every Day    Types: Cigars   Smokeless tobacco: Never   Tobacco comments:    smokes every couple of months    Vaping Use   Vaping Use: Every day  Substance Use Topics   Alcohol use: Yes    Alcohol/week: 6.0 standard drinks    Types: 6 Standard drinks or equivalent per week   Drug use: Yes    Types: Marijuana     Allergies   Penicillins   Review of Systems Review of Systems  Constitutional:  Positive for activity change. Negative for appetite change, fatigue and fever.  Respiratory:  Negative for cough and shortness of breath.   Cardiovascular:  Negative for chest pain.  Gastrointestinal:  Negative for abdominal pain, diarrhea, nausea and vomiting.  Musculoskeletal:  Negative for arthralgias and myalgias.  Skin:  Positive for wound. Negative for color change.  Neurological:  Negative for dizziness, light-headedness and headaches.    Physical Exam Triage Vital Signs ED Triage Vitals [05/30/21 1413]  Enc Vitals Group     BP 113/73     Pulse Rate 82     Resp 16     Temp 98.3 F (36.8 C)     Temp Source Oral     SpO2 100 %     Weight      Height      Head Circumference      Peak Flow      Pain Score 5     Pain Loc      Pain Edu?      Excl. in [place]?    No data found.  Updated Vital Signs BP 113/73 (BP Location: Left Arm)    Pulse 82    Temp 98.3 F (36.8 C) (Oral)    Resp 16    SpO2 100%   Visual Acuity Right Eye Distance:   Left Eye Distance:   Bilateral Distance:    Right Eye Near:   Left Eye Near:    Bilateral Near:     Physical Exam Vitals reviewed.  Constitutional:      General: He is awake.     Appearance: Normal appearance. He is well-developed. He is not ill-appearing.     Comments: Very pleasant male appears stated age in no acute distress sitting comfortably in exam room  HENT:     Head: Normocephalic and atraumatic.   Cardiovascular:     Rate and Rhythm: Normal rate and regular rhythm.     Heart sounds: Normal heart sounds, S1 normal and S2 normal. No murmur heard. Pulmonary:     Effort: Pulmonary effort is normal.     Breath sounds: Normal breath sounds. No stridor. No wheezing, rhonchi or rales.  Comments: Clear to auscultation bilaterally Abdominal:     Palpations: Abdomen is soft.     Tenderness: There is no abdominal tenderness.  Skin:    Comments: 2 cm x 1 cm erythematous nodule noted right axilla with induration without fluctuance.  No streaking or evidence of lymphangitis.  No active bleeding or drainage noted.  Neurological:     Mental Status: He is alert.  Psychiatric:        Behavior: Behavior is cooperative.     UC Treatments / Results  Labs (all labs ordered are listed, but only abnormal results are displayed) Labs Reviewed - No data to display  EKG   Radiology No results found.  Procedures Procedures (including critical care time)  Medications Ordered in UC Medications - No data to display  Initial Impression / Assessment and Plan / UC Course  I have reviewed the triage vital signs and the nursing notes.  Pertinent labs & imaging results that were available during my care of the patient were reviewed by me and considered in my medical decision making (see chart for details).     I&D deferred given no fluctuance on exam.  We will treat with antibiotics.  Given recent doxycycline use will use Keflex and Bactrim.  Patient does have a history of allergic reaction to penicillins but has taken Augmentin as well as Rocephin within the past several years without difficulty.  He has taken Keflex in 2015 with no adverse events.  Recommended he continue warm compresses and use over-the-counter medications for symptom relief.  Given recurrent history recommend he follow-up with a dermatologist and was given contact information for local provider.  Discussed that if he has any  worsening symptoms including enlarging lesion, fever, nausea/vomiting, weakness he needs to go to be seen immediately.  Strict return precautions given to which he expressed understanding.  Final Clinical Impressions(s) / UC Diagnoses   Final diagnoses:  Abscess of right axilla     Discharge Instructions      Take Keflex 3 times a day for 1 week and Bactrim twice daily for 1 week.  Take these with food.  Continue warm compresses and use Tylenol as needed for pain and fever.  Follow-up with dermatology given recurrent symptoms.  If you have any worsening symptoms including enlarging lesion, fever, nausea/vomiting you should be seen immediately.     ED Prescriptions     Medication Sig Dispense Auth. Provider   cephALEXin (KEFLEX) 500 MG capsule Take 1 capsule (500 mg total) by mouth 3 (three) times daily. 21 capsule Baneen Wieseler K, PA-C   sulfamethoxazole-trimethoprim (BACTRIM DS) 800-160 MG tablet Take 1 tablet by mouth 2 (two) times daily for 7 days. 14 tablet Nishawn Rotan, Derry Skill, PA-C      PDMP not reviewed this encounter.   Terrilee Croak, PA-C 05/30/21 1439

## 2021-07-22 ENCOUNTER — Encounter: Payer: Self-pay | Admitting: Emergency Medicine

## 2021-07-22 ENCOUNTER — Ambulatory Visit (INDEPENDENT_AMBULATORY_CARE_PROVIDER_SITE_OTHER): Payer: Medicaid Other | Admitting: Emergency Medicine

## 2021-07-22 ENCOUNTER — Other Ambulatory Visit: Payer: Self-pay

## 2021-07-22 VITALS — BP 118/60 | HR 105 | Ht 69.0 in | Wt 123.0 lb

## 2021-07-22 DIAGNOSIS — L089 Local infection of the skin and subcutaneous tissue, unspecified: Secondary | ICD-10-CM | POA: Diagnosis not present

## 2021-07-22 DIAGNOSIS — B958 Unspecified staphylococcus as the cause of diseases classified elsewhere: Secondary | ICD-10-CM | POA: Diagnosis not present

## 2021-07-22 DIAGNOSIS — B20 Human immunodeficiency virus [HIV] disease: Secondary | ICD-10-CM | POA: Diagnosis not present

## 2021-07-22 MED ORDER — MUPIROCIN CALCIUM 2 % EX CREA: 1.0000 "application " | TOPICAL_CREAM | Freq: Two times a day (BID) | CUTANEOUS | 2 refills | Status: DC

## 2021-07-22 NOTE — Patient Instructions (Signed)
Skin Abscess ?A skin abscess is an infected area of your skin that contains pus and other material. An abscess can happen in any part of your body. Some abscesses break open (rupture) on their own. Most continue to get worse unless they are treated. The infection can spread deeper into the body and into your blood, which can make you feel sick. ?A skin abscess is caused by germs that enter the skin through a cut or scrape. It can also be caused by blocked oil and sweat glands or infected hair follicles. ?This condition is usually treated by: ?Draining the pus. ?Taking antibiotic medicines. ?Placing a warm, wet washcloth over the abscess. ?Follow these instructions at home: ?Medicines ? ?Take over-the-counter and prescription medicines only as told by your doctor. ?If you were prescribed an antibiotic medicine, take it as told by your doctor. Do not stop taking the antibiotic even if you start to feel better. ?Abscess care ? ?If you have an abscess that has not drained, place a warm, clean, wet washcloth over the abscess several times a day. Do this as told by your doctor. ?Follow instructions from your doctor about how to take care of your abscess. Make sure you: ?Cover the abscess with a bandage (dressing). ?Change your bandage or gauze as told by your doctor. ?Wash your hands with soap and water before you change the bandage or gauze. If you cannot use soap and water, use hand sanitizer. ?Check your abscess every day for signs that the infection is getting worse. Check for: ?More redness, swelling, or pain. ?More fluid or blood. ?Warmth. ?More pus or a bad smell. ?General instructions ?To avoid spreading the infection: ?Do not share personal care items, towels, or hot tubs with others. ?Avoid making skin-to-skin contact with other people. ?Keep all follow-up visits as told by your doctor. This is important. ?Contact a doctor if: ?You have more redness, swelling, or pain around your abscess. ?You have more fluid or  blood coming from your abscess. ?Your abscess feels warm when you touch it. ?You have more pus or a bad smell coming from your abscess. ?You have a fever. ?Your muscles ache. ?You have chills. ?You feel sick. ?Get help right away if: ?You have very bad (severe) pain. ?You see red streaks on your skin spreading away from the abscess. ?Summary ?A skin abscess is an infected area of your skin that contains pus and other material. ?The abscess is caused by germs that enter the skin through a cut or scrape. It can also be caused by blocked oil and sweat glands or infected hair follicles. ?Follow your doctor's instructions on caring for your abscess, taking medicines, preventing infections, and keeping follow-up visits. ?This information is not intended to replace advice given to you by your health care provider. Make sure you discuss any questions you have with your health care provider. ?Document Revised: 11/22/2018 Document Reviewed: 06/01/2017 ?Elsevier Patient Education ? [place]. ? ?

## 2021-07-22 NOTE — Progress Notes (Signed)
Devon Hernandez ?26 y.o. ? ? ?Chief Complaint  ?Patient presents with  ? New Patient (Initial Visit)  ?  Abcess on genitalis, pain   ? ? ?HISTORY OF PRESENT ILLNESS: ?This is a 26 y.o. male complaining of abscess to right axilla. ?States he had similar lesion in his genitals but is no longer there. ?Has history of HIV. ?Seen by me once in 2018 due to perianal warts.  Was referred to dermatology but never schedule appointment. ?No other complaints or medical concerns today. ? ?HPI ? ? ?Prior to Admission medications   ?Medication Sig Start Date End Date Taking? Authorizing Provider  ?BIKTARVY 50-200-25 MG TABS tablet TAKE 1 TABLET BY MOUTH 1 TIME A DAY. 03/30/21  Yes Campbell Riches, MD  ?hydrOXYzine (ATARAX/VISTARIL) 25 MG tablet Take 25 mg by mouth 3 (three) times daily as needed.   Yes [provider]  ?mirtazapine (REMERON) 15 MG tablet Take 15 mg by mouth at bedtime.   Yes [provider]  ?mupirocin cream (BACTROBAN) 2 % Apply 1 application. topically 2 (two) times daily. 07/22/21  Yes Eiko Mcgowen, Ines Bloomer, MD  ?AMBULATORY NON FORMULARY MEDICATION Medication Name: Nitroglycerin 0.125% ? ?Apply a pea-sized amount to the affected area twice daily for 6-8 weeks.  Avoid strenuous activity/exercise within 30 minutes of application ?Patient not taking: Reported on 07/22/2021 10/07/20   Cirigliano, Dominic Pea, DO  ?hydrocortisone (ANUSOL-HC) 2.5 % rectal cream Place 1 application rectally 2 (two) times daily. ?Patient not taking: Reported on 07/22/2021 08/22/20   Lennice Sites, DO  ?lamoTRIgine (LAMICTAL) 25 MG tablet Take by mouth. 06/08/21   [provider]  ?lithium carbonate 300 MG capsule Take 300 mg by mouth 2 (two) times daily with a meal. ?Patient not taking: Reported on 07/22/2021    [provider]  ?risperiDONE (RISPERDAL) 1 MG tablet Take 1.5 mg by mouth 2 (two) times daily. 06/08/21   [provider]  ? ? ?No Active Allergies ? ?Patient Active Problem List  ? Diagnosis Date  Noted  ? Depression 03/31/2021  ? Rectal bleeding 08/27/2020  ? Rectal discharge 08/27/2020  ? Bloating 08/27/2020  ? Constipation 08/25/2020  ? Routine screening for STI (sexually transmitted infection) 04/22/2019  ? Encounter for long-term (current) use of high-risk medication 04/22/2019  ? Major depressive disorder, recurrent episode, mild ([place]) 10/13/2018  ? Human immunodeficiency virus (HIV) disease ([place]) 07/23/2018  ? 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 Perianal wart 03/30/2017  ? Polysubstance abuse ([place]) 07/11/2013  ? MDD (major depressive disorder), recurrent episode, severe ([place]) 01/18/2013  ? ADHD (attention deficit hyperactivity disorder), combined type 01/18/2013  ? Conduct disorder, 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  ? ? ?Past Medical History:  ?Diagnosis Date  ? ADHD (attention deficit hyperactivity disorder)   ? Allergy   ? Anxiety   ? Bipolar disorder ([place])   ? Depression   ? Headache(784.0)   ? HIV (human immunodeficiency virus infection) ([place]) 07/05/2018  ? Immune deficiency disorder ([place])   ? Rectal abscess   ? ? ?Past Surgical History:  ?Procedure Laterality Date  ? TYMPANOSTOMY TUBE PLACEMENT    ? ? ?Social History  ? ?Socioeconomic History  ? Marital status: Single  ?  Spouse name: Not on file  ? Number of children: Not on file  ? Years of education: Not on file  ? Highest education level: Not on file  ?Occupational History  ? Not on file  ?Tobacco Use  ? Smoking status: Every Day  ?  Types: Cigars  ? Smokeless tobacco: Never  ? Tobacco comments:  ?  smokes every couple of months    ?Vaping Use  ? Vaping Use: Every day  ?Substance and Sexual Activity  ? Alcohol use: Yes  ?  Alcohol/week: 6.0 standard drinks  ?  Types: 6 Standard drinks or equivalent per week  ? Drug use: Yes  ?  Types: Marijuana  ? Sexual activity: Yes  ?  Comment: given condoms   ?Other Topics Concern  ? Not on file  ?Social History Narrative  ? Mom currently has full custody but Polo has gone to live  with his dad. Tonight was the first night that he was at dad's house. He admitted to marijuana use tonight, unclear when. Smoke exposure at home. No pets at home.  ? ?Social Determinants of Health  ? ?Financial Resource Strain: Not on file  ?Food Insecurity: Not on file  ?Transportation Needs: Not on file  ?Physical Activity: Not on file  ?Stress: Not on file  ?Social Connections: Not on file  ?Intimate Partner Violence: Not on file  ? ? ?Family History  ?Problem Relation Age of Onset  ? Depression Mother   ? Hypertension Mother   ? Depression Father   ?     Hx: PTSD  ? Diabetes Maternal Aunt   ? AVM Maternal Grandmother   ? Aneurysm Maternal Grandmother   ? Seizures Maternal Grandmother   ? Stomach cancer Neg Hx   ? Pancreatic cancer Neg Hx   ? Colon cancer Neg Hx   ? Liver disease Neg Hx   ? Esophageal cancer Neg Hx   ? Rectal cancer Neg Hx   ? ? ? ?Review of Systems  ?Constitutional: Negative.  Negative for fever.  ?HENT:  Negative for congestion and sore throat.   ?Respiratory: Negative.  Negative for cough and shortness of breath.   ?Cardiovascular:  Negative for chest pain and palpitations.  ?Gastrointestinal:  Negative for abdominal pain, nausea and vomiting.  ?Genitourinary: Negative.   ?Skin:   ?     Recurrent skin lesions  ?Neurological: Negative.  Negative for dizziness and headaches.  ?All other systems reviewed and are negative. ? ?Today's Vitals  ? 07/22/21 1509  ?BP: 118/60  ?Pulse: (!) 105  ?SpO2: 94%  ?Weight: 123 lb (55.8 kg)  ?Height: '5\' 9"'$  (1.753 m)  ? ?Body mass index is 18.16 kg/m?. ? ?Physical Exam ?Vitals reviewed.  ?Constitutional:   ?   Appearance: Normal appearance.  ?HENT:  ?   Head: Normocephalic.  ?Eyes:  ?   Extraocular Movements: Extraocular movements intact.  ?Cardiovascular:  ?   Rate and Rhythm: Normal rate.  ?Pulmonary:  ?   Effort: Pulmonary effort is normal.  ?Skin: ?   General: Skin is warm and dry.  ?   Comments: Right axillary area: Small erythematous cyst without  fluctuation.  Patient states he opened it several days ago and drained pus  ?Neurological:  ?   Mental Status: He is alert and oriented to person, place, and time.  ?Psychiatric:     ?   Mood and Affect: Mood normal.     ?   Behavior: Behavior normal.  ? ? ? ?ASSESSMENT & PLAN: ?Problem List Items Addressed This Visit   ? ?  ? Other  ? Human immunodeficiency virus (HIV) disease ([place])  ? Relevant Medications  ? mupirocin cream (BACTROBAN) 2 %  ? ?Other Visit Diagnoses   ? ? Staphylococcal infection of skin    -  Primary  ? Relevant Medications  ? mupirocin cream (BACTROBAN) 2 %  ? Other Relevant Orders  ? Ambulatory referral to Dermatology  ? ?  ? ? ? ?Patient Instructions  ?Skin Abscess ?A skin abscess is an infected area of your skin that contains pus and other material. An abscess can happen in any part of your body. Some abscesses break open (rupture) on their own. Most continue to get worse unless they are treated. The infection can spread deeper into the body and into your blood, which can make you feel sick. ?A skin abscess is caused by germs that enter the skin through a cut or scrape. It can also be caused by blocked oil and sweat glands or infected hair follicles. ?This condition is usually treated by: ?Draining the pus. ?Taking antibiotic medicines. ?Placing a warm, wet washcloth over the abscess. ?Follow these instructions at home: ?Medicines ? ?Take over-the-counter and prescription medicines only as told by your doctor. ?If you were prescribed an antibiotic medicine, take it as told by your doctor. Do not stop taking the antibiotic even if you start to feel better. ?Abscess care ? ?If you have an abscess that has not drained, place a warm, clean, wet washcloth over the abscess several times a day. Do this as told by your doctor. ?Follow instructions from your doctor about how to take care of your abscess. Make sure you: ?Cover the abscess with a bandage (dressing). ?Change your bandage or gauze as told  by your doctor. ?Wash your hands with soap and water before you change the bandage or gauze. If you cannot use soap and water, use hand sanitizer. ?Check your abscess every day for signs that the infection is ge

## 2021-08-16 ENCOUNTER — Telehealth: Payer: Self-pay | Admitting: *Deleted

## 2021-08-16 DIAGNOSIS — B958 Unspecified staphylococcus as the cause of diseases classified elsewhere: Secondary | ICD-10-CM

## 2021-08-16 MED ORDER — MUPIROCIN 2 % EX OINT: TOPICAL_OINTMENT | Freq: Two times a day (BID) | CUTANEOUS | Status: AC

## 2021-08-16 NOTE — Telephone Encounter (Signed)
Use mupirocin ointment instead.  Thanks.

## 2021-08-16 NOTE — Telephone Encounter (Signed)
[facility] patient Mupirocin Cream .  ? ?PA denied:  ? ?Formulary alternatives are getamicin cream, mupirocin ointment  ? ?Please advise  ?

## 2021-08-16 NOTE — Telephone Encounter (Signed)
New prescription ordered and sent to pharmacy on file  ?

## 2021-08-18 ENCOUNTER — Ambulatory Visit: Payer: Medicaid Other | Admitting: Emergency Medicine

## 2021-08-23 ENCOUNTER — Ambulatory Visit: Payer: Medicaid Other | Admitting: Emergency Medicine

## 2021-08-31 ENCOUNTER — Other Ambulatory Visit: Payer: Self-pay | Admitting: Infectious Diseases

## 2021-08-31 DIAGNOSIS — B20 Human immunodeficiency virus [HIV] disease: Secondary | ICD-10-CM

## 2021-09-22 ENCOUNTER — Encounter: Payer: Self-pay | Admitting: Infectious Diseases

## 2021-09-29 ENCOUNTER — Ambulatory Visit: Payer: Medicaid Other | Admitting: Internal Medicine

## 2021-10-01 ENCOUNTER — Ambulatory Visit: Payer: Medicaid Other | Admitting: Internal Medicine

## 2021-10-01 ENCOUNTER — Telehealth: Payer: Self-pay

## 2021-10-01 ENCOUNTER — Other Ambulatory Visit: Payer: Self-pay | Admitting: Internal Medicine

## 2021-10-01 DIAGNOSIS — B20 Human immunodeficiency virus [HIV] disease: Secondary | ICD-10-CM

## 2021-10-01 NOTE — Telephone Encounter (Signed)
Called patient to reschedule missed appointment. No answer. Left HIPAA-compliant voicemail requesting call back.  ? ?Daelyn Mozer E Shemuel Harkleroad, RN  ?

## 2021-10-01 NOTE — Telephone Encounter (Signed)
Appt today at 415

## 2021-11-05 ENCOUNTER — Other Ambulatory Visit: Payer: Self-pay | Admitting: Internal Medicine

## 2021-11-05 ENCOUNTER — Telehealth: Payer: Self-pay

## 2021-11-05 DIAGNOSIS — B20 Human immunodeficiency virus [HIV] disease: Secondary | ICD-10-CM

## 2021-11-05 NOTE — Telephone Encounter (Signed)
Called patient to schedule overdue appointment, no answer. Left HIPAA compliant voicemail requesting callback.   Beryle Flock, RN

## 2021-12-13 ENCOUNTER — Other Ambulatory Visit: Payer: Self-pay | Admitting: Internal Medicine

## 2021-12-13 DIAGNOSIS — B20 Human immunodeficiency virus [HIV] disease: Secondary | ICD-10-CM

## 2021-12-13 NOTE — Telephone Encounter (Signed)
Appt with pharm 12/14/21

## 2021-12-15 ENCOUNTER — Ambulatory Visit (INDEPENDENT_AMBULATORY_CARE_PROVIDER_SITE_OTHER): Payer: Medicaid Other | Admitting: Pharmacist

## 2021-12-15 ENCOUNTER — Other Ambulatory Visit: Payer: Self-pay

## 2021-12-15 DIAGNOSIS — B20 Human immunodeficiency virus [HIV] disease: Secondary | ICD-10-CM

## 2021-12-15 MED ORDER — BICTEGRAVIR-EMTRICITAB-TENOFOV 50-200-25 MG PO TABS: 1.0000 | ORAL_TABLET | Freq: Every day | ORAL | 6 refills | Status: DC

## 2021-12-15 NOTE — Progress Notes (Signed)
HPI: Devon Hernandez is a 26 y.o. male who presents to the [facility] for HIV follow-up.  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  Previous Medications   AMBULATORY NON FORMULARY MEDICATION    Medication Name: Nitroglycerin 0.125%  Apply a pea-sized amount to the affected area twice daily for 6-8 weeks.  Avoid strenuous activity/exercise within 30 minutes of application   BIKTARVY 78-295-62 MG TABS TABLET    TAKE 1 TABLET BY MOUTH 1 TIME A DAY.   HYDROCORTISONE (ANUSOL-HC) 2.5 % RECTAL CREAM    Place 1 application rectally 2 (two) times daily.   HYDROXYZINE (ATARAX/VISTARIL) 25 MG TABLET    Take 25 mg by mouth 3 (three) times daily as needed.   LAMOTRIGINE (LAMICTAL) 25 MG TABLET    Take by mouth.   LITHIUM CARBONATE 300 MG CAPSULE    Take 300 mg by mouth 2 (two) times daily with a meal.   MIRTAZAPINE (REMERON) 15 MG TABLET    Take 15 mg by mouth at bedtime.   MUPIROCIN CREAM (BACTROBAN) 2 %    Apply 1 application. topically 2 (two) times daily.   RISPERIDONE (RISPERDAL) 1 MG  TABLET    Take 1.5 mg by mouth 2 (two) times daily.  Modified Medications   No medications on file  Discontinued Medications   No medications on file    Allergies: No Active Allergies  Past Medical History: Past Medical History:  Diagnosis Date   ADHD (attention deficit hyperactivity disorder)    Allergy    Anxiety    Bipolar disorder (HCC)    Depression    Headache(784.0)    HIV (human immunodeficiency virus infection) ([place]) 07/05/2018   Immune deficiency disorder ([place])    Rectal abscess     Social History: Social History   Socioeconomic History   Marital status: Single    Spouse name: Not on file   Number of children: Not on file   Years of education: Not on file   Highest education level: Not on file  Occupational History   Not on file  Tobacco Use   Smoking status: Every Day    Types: Cigars   Smokeless tobacco: Never   Tobacco comments:    smokes every couple of months    Vaping Use   Vaping Use: Every day  Substance and Sexual Activity   Alcohol use: Yes    Alcohol/week: 6.0 standard drinks of alcohol    Types: 6 Standard drinks or equivalent per week   Drug use: Yes    Types: Marijuana   Sexual activity: Yes    Comment: given condoms   Other Topics Concern   Not on  file  Social History Narrative   Mom currently has full custody but Lavoris has gone to live with his dad. Tonight was the first night that he was at dad's house. He admitted to marijuana use tonight, unclear when. Smoke exposure at home. No pets at home.   Social Determinants of Health   Financial Resource Strain: Not on file  Food Insecurity: Not on file  Transportation Needs: Not on file  Physical Activity: Not on file  Stress: Not on file  Social Connections: Not on file    Labs: Lab Results  Component Value Date   HIV1RNAQUANT 28 (H) 03/31/2021   HIV1RNAQUANT Not Detected 07/29/2020   HIV1RNAQUANT <20 02/12/2020   CD4TABS 1,235 03/31/2021   CD4TABS 1,324 07/29/2020    CD4TABS 1,250 02/12/2020    RPR and STI Lab Results  Component Value Date   LABRPR REACTIVE (A) 03/31/2021   LABRPR REACTIVE (A) 07/29/2020   LABRPR REACTIVE (A) 02/12/2020   LABRPR REACTIVE (A) 04/22/2019   LABRPR REACTIVE (A) 07/05/2018   RPRTITER 1:4 (H) 03/31/2021   RPRTITER 1:4 (H) 07/29/2020   RPRTITER 1:4 (H) 02/12/2020   RPRTITER 1:8 (H) 04/22/2019   RPRTITER 1:32 (H) 07/05/2018    STI Results GC CT  03/31/2021 10:56 AM Negative    Negative    Negative  Negative    Negative    Negative   07/29/2020  2:31 PM Negative  Negative   02/12/2020 10:11 AM Negative    Negative    Negative  Negative    Positive    Negative   04/22/2019  9:01 AM Negative    Negative    Negative  Negative    Negative    Negative   09/05/2018 12:00 AM Negative  Negative   02/28/2017 12:00 AM Negative  Negative   12/24/2015 12:00 AM Negative  Negative     Hepatitis B Lab Results  Component Value Date   HEPBSAB NON-REACTIVE 02/17/2020   HEPBSAG NON-REACTIVE 02/17/2020   HEPBCAB NON-REACTIVE 02/17/2020   Hepatitis C Lab Results  Component Value Date   HEPCAB NON-REACTIVE 07/05/2018   Hepatitis A Lab Results  Component Value Date   HAV NON-REACTIVE 07/05/2018   Lipids: Lab Results  Component Value Date   CHOL 121 07/29/2020   TRIG 110 07/29/2020   HDL 34 (L) 07/29/2020   CHOLHDL 3.6 07/29/2020   VLDL 14 01/19/2013   LDLCALC 67 07/29/2020    Current HIV Regimen: Biktarvy  Assessment: Leondre presents to clinic today for HIV follow-up after missing two appointments with Dr. Linus Salmons in June. He states that these appointments create an immense amount of anxiety in his life leading up the visit. He states it is overwhelming to think on his diagnosis and think back on memories from the clinic; he has passed out before after giving blood which is a major factor in this anxiety. He states today when I brought him back to discuss his medicines he almost cried because he thought he  was just here for labs. Asked if he would be interested in meeting with a counselor to talk through these feelings; he is interested. Provided resources for [facility] along with various online services. Also scheduled his 97-monthfollow-up as a telephone visit as to decrease any anxiety in arriving at the clinic. Reviewed that we can work with Dr. CLinus Salmonsto either schedule a lab visit here before the telephone visit or [facility]  Patient states he has been  taking his Biktarvy everyday and denies any issues. When asked about refills, he admitted to not always following [facility] because it overwhelms him to think about taking this daily medicine for the rest of his life. He is not interested in [facility] given frequent clinic visits. Will send in refills today and check HIV RNA, CMP, CBC, lipid panel, RPR, and urine cytology. Defers all vaccines today.   Plan: Check HIV RNA, CMP, CBC, lipid panel, RPR, and urine cytology Continue Biktarvy  Follow-up with Dr. Linus Salmons over the phone on 06/22/22  Alfonse Spruce, PharmD, CPP, [place] Clinical Pharmacist Practitioner [place] [facility] 12/15/2021, 10:56 AM

## 2021-12-15 NOTE — Addendum Note (Signed)
Addended by: Caffie Pinto on: 12/15/2021 11:14 AM   Modules accepted: Orders

## 2021-12-15 NOTE — Patient Instructions (Addendum)
Online Counseling Services: Best for Availability: BetterHelp Best for Couples: [facility] Most Comprehensive: Personnel officer for Psychiatry: Heritage manager for Anxiety and Depression: [facility] Most Affordable: [facility] Best for Flexibility: [employer] of Specialties: Mobile: Engineer, mining for Family Therapy: [place] for [employer]: [employer] and [employer] of Hospital doctor for [employer]: Therapy For [employer] Best Without Insurance: Psychologist, forensic

## 2021-12-16 NOTE — Progress Notes (Signed)
Received call today from Health Department to notify [facility] that patient has been listed as a syphilis contact and has no showed 3 appointment with [facility]. Staff made health department aware that patient currently has RPR pending as he was seen yesterday and we will follow up on test results.  Eugenia Mcalpine

## 2021-12-17 NOTE — Progress Notes (Signed)
Called patient to relay positive syphilis results. Prior titer of 1:4 in November 2022 now 1:16 after his partner tested positive for syphilis. He stated he figured he would just get screened at his appointment here with [place] rather than follow up with [facility]. Denies any symptoms and confirms he has abstained from sexual activity with his partner. Will return next Wednesday for Bicillin x 1. - Estill Bamberg

## 2021-12-19 LAB — COMPREHENSIVE METABOLIC PANEL
AG Ratio: 1.8 (calc) (ref 1.0–2.5)
ALT: 11 U/L (ref 9–46)
AST: 21 U/L (ref 10–40)
Albumin: 4.8 g/dL (ref 3.6–5.1)
Alkaline phosphatase (APISO): 66 U/L (ref 36–130)
BUN: 21 mg/dL (ref 7–25)
CO2: 27 mmol/L (ref 20–32)
Calcium: 9.6 mg/dL (ref 8.6–10.3)
Chloride: 106 mmol/L (ref 98–110)
Creat: 1.23 mg/dL (ref 0.60–1.24)
Globulin: 2.6 g/dL (calc) (ref 1.9–3.7)
Glucose, Bld: 125 mg/dL — ABNORMAL HIGH (ref 65–99)
Potassium: 3.7 mmol/L (ref 3.5–5.3)
Sodium: 141 mmol/L (ref 135–146)
Total Bilirubin: 0.7 mg/dL (ref 0.2–1.2)
Total Protein: 7.4 g/dL (ref 6.1–8.1)

## 2021-12-19 LAB — CBC
HCT: 39.5 % (ref 38.5–50.0)
Hemoglobin: 13.5 g/dL (ref 13.2–17.1)
MCH: 32.4 pg (ref 27.0–33.0)
MCHC: 34.2 g/dL (ref 32.0–36.0)
MCV: 94.7 fL (ref 80.0–100.0)
MPV: 9.9 fL (ref 7.5–12.5)
Platelets: 278 10*3/uL (ref 140–400)
RBC: 4.17 10*6/uL — ABNORMAL LOW (ref 4.20–5.80)
RDW: 11.7 % (ref 11.0–15.0)
WBC: 10.4 10*3/uL (ref 3.8–10.8)

## 2021-12-19 LAB — RPR TITER: RPR Titer: 1:16 {titer} — ABNORMAL HIGH

## 2021-12-19 LAB — LIPID PANEL
Cholesterol: 138 mg/dL (ref <200)
HDL: 51 mg/dL (ref >=40)
LDL Cholesterol (Calc): 66 mg/dL (calc)
Non-HDL Cholesterol (Calc): 87 mg/dL (calc) (ref <130)
Total CHOL/HDL Ratio: 2.7 (calc) (ref <5.0)
Triglycerides: 129 mg/dL (ref <150)

## 2021-12-19 LAB — RPR: RPR Ser Ql: REACTIVE — AB

## 2021-12-19 LAB — HIV-1 RNA QUANT-NO REFLEX-BLD
HIV 1 RNA Quant: NOT DETECTED Copies/mL
HIV-1 RNA Quant, Log: NOT DETECTED Log cps/mL

## 2021-12-19 LAB — FLUORESCENT TREPONEMAL AB(FTA)-IGG-BLD: Fluorescent Treponemal ABS: REACTIVE — AB

## 2021-12-22 ENCOUNTER — Ambulatory Visit: Payer: Medicaid Other | Admitting: Pharmacist

## 2021-12-22 ENCOUNTER — Encounter: Payer: Self-pay | Admitting: Pharmacist

## 2022-01-05 ENCOUNTER — Ambulatory Visit: Payer: Medicaid Other

## 2022-01-14 ENCOUNTER — Ambulatory Visit: Payer: Medicaid Other

## 2022-02-23 ENCOUNTER — Other Ambulatory Visit: Payer: Self-pay

## 2022-02-23 ENCOUNTER — Encounter: Payer: Self-pay | Admitting: Internal Medicine

## 2022-02-23 ENCOUNTER — Ambulatory Visit (INDEPENDENT_AMBULATORY_CARE_PROVIDER_SITE_OTHER): Payer: 59 | Admitting: Internal Medicine

## 2022-02-23 ENCOUNTER — Other Ambulatory Visit (HOSPITAL_COMMUNITY)
Admission: RE | Admit: 2022-02-23 | Discharge: 2022-02-23 | Disposition: A | Discharge status: Home / Self Care | Payer: Managed Care, Other (non HMO) | Source: Ambulatory Visit | Attending: Internal Medicine | Admitting: Internal Medicine

## 2022-02-23 VITALS — BP 129/80 | HR 106 | Temp 97.6°F | Wt 125.0 lb

## 2022-02-23 DIAGNOSIS — A539 Syphilis, unspecified: Secondary | ICD-10-CM | POA: Insufficient documentation

## 2022-02-23 DIAGNOSIS — Z113 Encounter for screening for infections with a predominantly sexual mode of transmission: Secondary | ICD-10-CM

## 2022-02-23 DIAGNOSIS — B20 Human immunodeficiency virus [HIV] disease: Secondary | ICD-10-CM

## 2022-02-23 MED ORDER — PENICILLIN G BENZATHINE 1200000 UNIT/2ML IM SUSY
1.2000 10*6.[IU] | PREFILLED_SYRINGE | Freq: Once | INTRAMUSCULAR | Status: AC
  Administered 2022-02-23: 1.2 10*6.[IU] via INTRAMUSCULAR

## 2022-02-23 NOTE — Assessment & Plan Note (Signed)
He continues to do well on [place] and denies any issues.  Will do his labs again today and he can rtc in 6 months. Previous labs reviewed with him.

## 2022-02-23 NOTE — Progress Notes (Signed)
   Subjective:    Patient ID: Devon Hernandez, male    DOB: 02-Aug-1995, 26 y.o.   MRN: [id number]  HPI Here for follow up of HIV He continues on Biktarvy and denies any missed doses.  He tested positive for syphilis but has not yet received treatment.  Here with his mother who is aware of all of his diagnoses.  No new concerns otherwise.  Last labs in August were good.     Review of Systems  Constitutional:  Negative for fatigue.  Gastrointestinal:  Negative for diarrhea.  Skin:  Negative for rash.       Objective:   Physical Exam Eyes:     General: No scleral icterus. Pulmonary:     Effort: Pulmonary effort is normal.  Skin:    Findings: No rash.  Neurological:     Mental Status: He is alert.  Psychiatric:        Mood and Affect: Mood normal.   SH: + tobacco        Assessment & Plan:

## 2022-02-23 NOTE — Assessment & Plan Note (Signed)
Will screen today 

## 2022-02-23 NOTE — Assessment & Plan Note (Signed)
Treatment provided today

## 2022-02-24 LAB — URINE CYTOLOGY ANCILLARY ONLY
Chlamydia: NEGATIVE
Comment: NEGATIVE
Comment: NORMAL
Neisseria Gonorrhea: NEGATIVE

## 2022-02-24 LAB — CYTOLOGY, (ORAL, ANAL, URETHRAL) ANCILLARY ONLY
Chlamydia: NEGATIVE
Chlamydia: NEGATIVE
Comment: NEGATIVE
Comment: NEGATIVE
Comment: NORMAL
Comment: NORMAL
Neisseria Gonorrhea: NEGATIVE
Neisseria Gonorrhea: NEGATIVE

## 2022-02-24 LAB — T-HELPER CELL (CD4) - (RCID CLINIC ONLY)
CD4 % Helper T Cell: 46 % (ref 33–65)
CD4 T Cell Abs: 1765 /uL (ref 400–1790)

## 2022-02-25 LAB — HIV-1 RNA QUANT-NO REFLEX-BLD
HIV 1 RNA Quant: <20 Copies/mL — ABNORMAL HIGH
HIV-1 RNA Quant, Log: <1.3 Log cps/mL — ABNORMAL HIGH

## 2022-04-01 ENCOUNTER — Other Ambulatory Visit: Payer: Self-pay

## 2022-04-01 DIAGNOSIS — B20 Human immunodeficiency virus [HIV] disease: Secondary | ICD-10-CM

## 2022-04-01 MED ORDER — BICTEGRAVIR-EMTRICITAB-TENOFOV 50-200-25 MG PO TABS: 1.0000 | ORAL_TABLET | Freq: Every day | ORAL | 1 refills | Status: DC

## 2022-06-22 ENCOUNTER — Telehealth: Payer: Managed Care, Other (non HMO) | Admitting: Internal Medicine

## 2022-06-22 ENCOUNTER — Other Ambulatory Visit: Payer: Self-pay

## 2022-09-20 ENCOUNTER — Telehealth: Payer: Self-pay

## 2022-09-20 ENCOUNTER — Ambulatory Visit (HOSPITAL_COMMUNITY)
Admission: EM | Admit: 2022-09-20 | Discharge: 2022-09-20 | Disposition: A | Discharge status: Home / Self Care | Payer: 59

## 2022-09-20 DIAGNOSIS — F913 Oppositional defiant disorder: Secondary | ICD-10-CM | POA: Diagnosis not present

## 2022-09-20 DIAGNOSIS — Z8659 Personal history of other mental and behavioral disorders: Secondary | ICD-10-CM

## 2022-09-20 DIAGNOSIS — F1910 Other psychoactive substance abuse, uncomplicated: Secondary | ICD-10-CM | POA: Diagnosis not present

## 2022-09-20 NOTE — ED Provider Notes (Signed)
Behavioral Health Urgent Care Medical Screening Exam  Patient Name: Devon Hernandez MRN: [id number] Date of Evaluation: 09/20/22 Chief Complaint:  I'm here due to a court order" Diagnosis:  Final diagnoses:  Substance abuse (HCC)  Oppositional defiant disorder  History of behavioral and mental health problems    History of Present illness: Devon Hernandez is a 27 y.o. male.  With psychiatric history of ODD, borderline personality disorder, schizoaffective disorder, PTSD, ADHD, anxiety, MDD, conduct disorder who presents voluntarily as a walk-in to [facility] accompanied by his mother Devon Hernandez [phone] with complaints lack of impulse control, escalating shoplifting habits, and mandated court ordered mental health treatment for substance abuse.  Patient was seen face-to-face by this provider and chart reviewed.  Patient gave permission for his mother to remain in the room during this evaluation.  On evaluation, patient is alert, oriented x 4, and cooperative. Speech is normal rate, clear and coherent. Pt appears well groomed. Eye contact is good. Mood is euthymic, affect is congruent with mood. Thought process is coherent and thought content is WDL. Pt denies SI/HI/AVH or paranoia. There is no objective indication that the patient is responding to internal stimuli. No delusions elicited during this assessment.    Patient reports "I came because of a court order to get substance abuse and mental health treatment".  Patient endorses daily use of marijuana since age 41 and last used yesterday.  Patient endorses daily use of meth for a year and a half now, and last use yesterday. Patient also endorsed occasional alcohol use.  Patient reports "it's affecting my livelihood, daily performance, socially, and academically, I just got out of jail for shoplifting and have a court date for June 30, I've been stealing and shoplifting since middle school, I was charged as a juvenile for stealing and crashing  cars, I didn't know I had a warrant two weeks ago, and I was arrested and sent to jail and just released today with those mandated orders".  Patient reports "I need some help but not right now, I wanna go home and get some sleep, I just got out of jail, I will return tomorrow to begin treatment".  Supports, encouragement, reassurance provided about ongoing stressors.  Patient was provided with opportunity for questions.  Collateral information was obtained from the patient's mom Devon Hernandez, who reports that the patient's impulse control/shoplifting and stealing behaviors has been ongoing since middle school but has escalated significantly over time to include brazen shoplifting acts, and he has been banned from [employer] and [employer], but returned to a [employer] store outside town to steal yesterday and was arrested.  She describes his behavior as a compulsion which has never been treated or diagnosed.  She reports the patient gets a disability check was diagnosed with ODD as a teenager, and hospitalized 3 times for suicide attempts between 2014-2016.  She reports patient is not currently established with an outpatient psychiatric provider. She reports she got a call from the [facility] jail on behalf of the patient after he was arrested for shoplifting yesterday and upon his release today, he was told he needs 7 days detox treatment, which is mandated court order.She reports she brought him to [facility] tonight for detox treatment, but since the patient is unwilling to stay tonight, she will bring him back tomorrow for possible admission to the [facility].   Flowsheet Row ED from 09/20/2022 in [facility] ED from 05/30/2021 in [facility] at [facility] ED from 04/19/2021  in [facility] at [facility] RISK CATEGORY No Risk No Risk Error: Question 6 not populated       Psychiatric Specialty Exam  Presentation  General Appearance:Appropriate for  Environment  Eye Contact:Good  Speech:Clear and Coherent  Speech Volume:Normal  Handedness:Right   Mood and Affect  Mood: Anxious  Affect: Congruent   Thought Process  Thought Processes: Coherent  Descriptions of Associations:Intact  Orientation:Full (Time, Place and Person)  Thought Content:WDL  Diagnosis of Schizophrenia or Schizoaffective disorder in past: Yes  Duration of Psychotic Symptoms: Greater than six months  Hallucinations:None  Ideas of Reference:None  Suicidal Thoughts:No  Homicidal Thoughts:No   Sensorium  Memory: Immediate Fair  Judgment: Poor  Insight: Poor   Executive Functions  Concentration: Good  Attention Span: Good  Recall: Good  Fund of Knowledge: Good  Language: Good   Psychomotor Activity  Psychomotor Activity: Normal   Assets  Assets: Communication Skills; Desire for Improvement; Social Support   Sleep  Sleep: Fair  Number of hours: No data recorded  Physical Exam: Physical Exam Constitutional:      General: He is not in acute distress.    Appearance: He is not diaphoretic.  HENT:     Head: Normocephalic.     Right Ear: External ear normal.     Left Ear: External ear normal.     Nose: No congestion.     Mouth/Throat:     Pharynx: No oropharyngeal exudate.  Eyes:     General:        Right eye: No discharge.        Left eye: No discharge.  Cardiovascular:     Rate and Rhythm: Normal rate.  Pulmonary:     Effort: No respiratory distress.  Chest:     Chest wall: No tenderness.  Neurological:     Mental Status: He is alert and oriented to person, place, and time.  Psychiatric:        Attention and Perception: Attention and perception normal.        Mood and Affect: Mood and affect normal.        Speech: Speech normal.        Behavior: Behavior is cooperative.        Thought Content: Thought content normal. Thought content is not paranoid or delusional. Thought content does not include  homicidal or suicidal ideation. Thought content does not include homicidal or suicidal plan.        Cognition and Memory: Cognition and memory normal.    Review of Systems  Constitutional:  Negative for chills, diaphoresis and fever.  HENT:  Negative for congestion.   Eyes:  Negative for discharge.  Respiratory:  Negative for cough, shortness of breath and wheezing.   Cardiovascular:  Negative for chest pain and palpitations.  Gastrointestinal:  Negative for diarrhea, nausea and vomiting.  Neurological:  Negative for dizziness, seizures, loss of consciousness and headaches.  Psychiatric/Behavioral:  Positive for substance abuse. Negative for depression, hallucinations, memory loss and suicidal ideas. The patient is not nervous/anxious and does not have insomnia.    Blood pressure 125/78, pulse 89, temperature 98 F (36.7 C), temperature source Oral, resp. rate 18, SpO2 100 %. There is no height or weight on file to calculate BMI.  Musculoskeletal: Strength & Muscle Tone: within normal limits Gait & Station: normal Patient leans: N/A   [facility] MSE Discharge Disposition for Follow up and Recommendations: Based on my evaluation the patient does not appear to have  an emergency medical condition and can be discharged with resources and follow up care in outpatient services for Individual Therapy and Substance abuse treatment.  Patient offered admission to the [facility] tonight for substance abuse treatment and stabilization but declined, stating he was not ready and would prefer to return in the am.   Recommend discharge home and follow up with the [facility] outpatient services for possible admission to the [facility] for substance abuse and mental health treatment per patient's wishes.  Patient does not meet IVC criteria.     Discharge recommendations:  Patient is to take medications as prescribed. Please see information for follow-up appointment with psychiatry and therapy. Please follow up with your  primary care provider for all medical related needs.   Therapy: We recommend that patient participate in individual therapy to address mental health concerns.  Medications: The patient or guardian is to contact a medical professional and/or outpatient provider to address any new side effects that develop. The patient or guardian should update outpatient providers of any new medications and/or medication changes.   Safety:  The patient should abstain from use of illicit substances/drugs and abuse of any medications. If symptoms worsen or do not continue to improve or if the patient becomes actively suicidal or homicidal then it is recommended that the patient return to the closest hospital emergency department, the [facility], or call 911 for further evaluation and treatment. National Suicide Prevention Lifeline 1-800-SUICIDE or 707-580-3818.  About 988 988 offers 24/7 access to trained crisis counselors who can help people experiencing mental health-related distress. People can call or text 988 or chat 988lifeline.org for themselves or if they are worried about a loved one who may need crisis support.  Crisis Mobile: [facility]:                     619-421-4618 (for crisis response 24 hours a day) [facility] Hotline:                                            272 327 8968   Discussed crisis plan, calling 911, going to the ED if condition changes or worsens.  Patient verbalizes understanding.  Patient discharged home and conditional discharge is stable.  Mancel Bale, NP 09/20/2022, 10:56 PM

## 2022-09-20 NOTE — Telephone Encounter (Signed)
Received fax from [facility] requesting records for most recent STD testing Will fax last note and labs.  Patient is also due for appointment. Will request call to schedule appt.  [phone] [phone] [name], RMA

## 2022-09-20 NOTE — Discharge Instructions (Signed)

## 2022-09-20 NOTE — ED Notes (Signed)
Patient discharged per physician's orders. AVS was provided and reviewed with patient. Follow up care recommendations explained to patient per orders/recommendations. Patient verbalizes understanding. Patient was given all personal items from black locker. Patient left ambulatory in stable condition.  Patient discharged without incident.

## 2022-09-20 NOTE — BH Assessment (Addendum)
Comprehensive Clinical Assessment (CCA) Note     09/20/2022 Devon Hernandez [id number]    Disposition: [name] recommends outpatient services. Pt was discharged and provided resources.   The patient demonstrates the following risk factors for suicide: Chronic risk factors for suicide include: substance use disorder. Acute risk factors for suicide include: social withdrawal/isolation. Protective factors for this patient include: positive social support. Considering these factors, the overall suicide risk at this point appears to be low. Patient is appropriate for outpatient follow up.     Pt presents to [facility] voluntarily accompanied by his mother. Per pts reports he has been previously diagnosed with Borderline personality disorder, Schizoaffective disorder, PTSD,ADHD, anxiety, MDD, conduct disorder. Pt reports he has been without medication for about 1 year.  Pt reports daily Marijuana use, about 1 gram. Pt reports occasional use of alcohol and meth, last use of meth was yesterday "1 puff". Pt reports he was arrested yesterday for shop lifting and was released today, and referred for a mental health evaluation and detox. Pt reports 4 past suicide attempts as a juvenile, and was hospitalized about 3 times. Last inpatient admission was at age 26 at [facility].  Pt reports passive SI "I don't wanna be here", but denies any plans or intent to harm himself.Pts mother reports the pt has a doctors appointment on 5/23 with his MD for his medical symptoms, pt is diagnosed with HIV. Pt reports he lives alone.Pt denies SI/HI and AVH currently.   Pt was casually dressed and groomed appropriately. Pt was alert and cooperative. Pt is dressed?unremarkably, alert, oriented x4 with normal speech and?normal?motor behavior. Eye contact is good. Pt's mood is depressed, and affect is anxious. Thought process is coherent and relevant. Pt's insight is?fair?and judgement is poor. There is no indication pt is currently  responding to internal stimuli or experiencing delusional thought content. Pt was cooperative throughout assessment.     Chief Complaint:  Chief Complaint  Patient presents with   Depression   Addiction Problem   Visit Diagnosis: =    CCA Screening, Triage and Referral (STR)  Patient Reported Information How did you hear about [place]? Family/Friend  What Is the Reason for Your Visit/Call Today? Pt presents to [facility] voluntarily accompanied by his mother. Per pts reports he has been previously diagnosed with Borderline personality disorder, Schizoaffective disorder, PTSD,ADHD, anxiety, MDD, conduct disorder. Pt reports he has been without medication for about 1 year.  Pt reports daily Marijuana use, about 1 gram. Pt reports occasional use of alcohol and meth, last use of meth was yesterday "1 puff". Pt reports he was arrested yesterday for shop lifting and was released today, and referred for a mental health evaluation and detox. Pt reports 4 past suicide attempts as a juvenile, and was hospitalized about 3 times. Last inpatient admission was at age 27 at [facility].  Pt reports passive SI "I don't wanna be here", but denies any plans or intent to harm himself.Pts mother reports the pt has a doctors appointment on 5/23 with his MD for his medical symptoms, pt is diagnosed with HIV. Pt reports he lives alone.Pt denies SI/HI and AVH currently.  How Long Has This Been Causing You Problems? 1 wk - 1 month  What Do You Feel Would Help You the Most Today? Treatment for Depression or other mood problem; Alcohol or Drug Use Treatment   Have You Recently Had Any Thoughts About Hurting Yourself? Yes  Are You Planning to Commit Suicide/Harm Yourself At This time? No  Flowsheet Row ED from 09/20/2022 in [facility] ED from 05/30/2021 in [facility] ED from 04/19/2021 in [facility] at [facility] RISK CATEGORY No Risk No Risk Error:  Question 6 not populated       Have you Recently Had Thoughts About Hurting Someone Karolee Ohs? No  Are You Planning to Harm Someone at This Time? No  Explanation: Pt denies HI   Have You Used Any Alcohol or Drugs in the Past 24 Hours? Yes  What Did You Use and How Much? t reports daily Marijuana use, about 1 gram. Pt reports occasional use of alcohol and meth, last use of meth was yesterday "1 puff"   Do You Currently Have a Therapist/Psychiatrist? No  Name of Therapist/Psychiatrist: Name of Therapist/Psychiatrist: Pt reports that he is currently not active with outpatient services . Pt's mother reports that last outpatient therapy services were August 2023.   Have You Been Recently Discharged From Any Office Practice or Programs? No  Explanation of Discharge From Practice/Program: n/a     CCA Screening Triage Referral Assessment Type of Contact: Face-to-Face  Telemedicine Service Delivery:   Is this Initial or Reassessment?   Date Telepsych consult ordered in [facility]:    Time Telepsych consult ordered in [facility]:    Location of Assessment: [facility] Assessment Services  Provider Location: [facility] Assessment Services   Collateral Involvement: Pt's mother : Brennan Bailey : [phone]   Does Patient Have a Court Appointed Legal Guardian? No  Legal Guardian Contact Information: n/a  Copy of Legal Guardianship Form: -- (n/a)  Legal Guardian Notified of Arrival: -- (n/a)  Legal Guardian Notified of Pending Discharge: -- (n/a)  If Minor and Not Living with Parent(s), Who has Custody? n/a  Is CPS involved or ever been involved? Never  Is APS involved or ever been involved? Never   Patient Determined To Be At Risk for Harm To Self or Others Based on Review of Patient Reported Information or Presenting Complaint? No  Method: No Plan  Availability of Means: No access or NA  Intent: Vague intent or NA  Notification Required: No need or identified person  Additional  Information for Danger to Others Potential: -- (n/a)  Additional Comments for Danger to Others Potential: n/a  Are There Guns or Other Weapons in Your Home? No  Types of Guns/Weapons: Pt denies access to guns/weapons  Are These Weapons Safely Secured?                            No  Who Could Verify You Are Able To Have These Secured: Pt denies access to guns/weapons  Do You Have any Outstanding Charges, Pending Court Dates, Parole/Probation? Pending: Shoplifting : October 03, 2022 court dates  Contacted To Inform of Risk of Harm To Self or Others: -- (n/a)    Does Patient Present under Involuntary Commitment? No    [place] of Residence: [place]   Patient Currently Receiving the Following Services: n/a  Determination of Need: Urgent (48 hours)   Options For Referral: [facility]; Medication Management; Outpatient Therapy     CCA Biopsychosocial Patient Reported Schizophrenia/Schizoaffective Diagnosis in Past: Yes   Strengths: Pt's willing to seek treatment   Mental Health Symptoms Depression:   Change in energy/activity; Hopelessness; Increase/decrease in appetite; Sleep (too much or little)   Duration of Depressive symptoms:    Mania:   None  Anxiety:    Irritability; Restlessness   Psychosis:   Hallucinations   Duration of Psychotic symptoms:  Duration of Psychotic Symptoms: Greater than six months   Trauma:   None   Obsessions:   None   Compulsions:   Intended to reduce stress or prevent another outcome   Inattention:   None   Hyperactivity/Impulsivity:   None   Oppositional/Defiant Behaviors:   None   Emotional Irregularity:   Chronic feelings of emptiness; Mood lability   Other Mood/Personality Symptoms:   none    Mental Status Exam Appearance and self-care  Stature:   Average   Weight:   Thin   Clothing:   Casual   Grooming:   Normal   Cosmetic use:   None   Posture/gait:   Normal   Motor activity:   Not  Remarkable   Sensorium  Attention:   Normal   Concentration:   Normal   Orientation:   Time; Situation; Place; Person   Recall/memory:   Normal   Affect and Mood  Affect:   Anxious; Labile; Depressed; Flat   Mood:   Hopeless; Depressed; Anxious   Relating  Eye contact:   Normal   Facial expression:   Depressed   Attitude toward examiner:   Cooperative   Thought and Language  Speech flow:  Clear and Coherent   Thought content:   Appropriate to Mood and Circumstances   Preoccupation:   None   Hallucinations:   Auditory   Organization:   Patent examiner of Knowledge:   Average   Intelligence:   Average   Abstraction:   Normal   Judgement:   Impaired   Programmer, systems   Insight:   Fair   Decision Making:   Normal   Social Functioning  Social Maturity:   Impulsive   Social Judgement:   Normal   Stress  Stressors:   Family conflict; Legal   Coping Ability:   Normal   Skill Deficits:   Communication; Interpersonal   Supports:   Family     Religion: Religion/Spirituality Are You A Religious Person?: Yes What is Your Religious Affiliation?: Christian How Might This Affect Treatment?: N/a  Leisure/Recreation: Leisure / Recreation Do You Have Hobbies?: Yes Leisure and Hobbies: cook and listen to music  Exercise/Diet: Exercise/Diet Do You Exercise?: No Have You Gained or Lost A Significant Amount of Weight in the Past Six Months?: No Do You Follow a Special Diet?: No Do You Have Any Trouble Sleeping?: Yes Explanation of Sleeping Difficulties: Pt reports that he is not sleeping at night   CCA Employment/Education Employment/Work Situation: Employment / Work Situation Employment Situation: On disability Why is Patient on Disability: Mental Health How Long has Patient Been on Disability: 2023 Patient's Job has Been Impacted by Current Illness: No Describe how Patient's Job has  Been Impacted: n/a Has Patient ever Been in the [employer]?: No  Education: Education Is Patient Currently Attending School?: No Last Grade Completed: 12 Did You Attend College?: No Did You Have An Individualized Education Program (IIEP): No Did You Have Any Difficulty At School?: No Patient's Education Has Been Impacted by Current Illness: No   CCA Family/Childhood History Family and Relationship History: Family history Marital status: Single Does patient have children?: No  Childhood History:  Childhood History By whom was/is the patient raised?: Mother (Father absent from age [age].) Did patient suffer any verbal/emotional/physical/sexual abuse as a child?: No Did patient suffer from severe  childhood neglect?: No Has patient ever been sexually abused/assaulted/raped as an adolescent or adult?: No Was the patient ever a victim of a crime or a disaster?: No Witnessed domestic violence?: No Has patient been affected by domestic violence as an adult?: No       CCA Substance Use Alcohol/Drug Use: Alcohol / Drug Use Pain Medications: See MAR Prescriptions: See MAR Over the Counter: See MAR History of alcohol / drug use?: Yes Longest period of sobriety (when/how long): 4 days ( Jan 2024) Negative Consequences of Use: Financial, Personal relationships, Legal Withdrawal Symptoms: None Substance #1 Name of Substance 1: Marijuana 1 - Age of First Use: 13 1 - Amount (size/oz): 2 grams 1 - Frequency: daily 1 - Duration: n/a 1 - Last Use / Amount: 09/19/2022 1 - Method of Aquiring: n/a 1- Route of Use: n/a Substance #2 Name of Substance 2: Meth 2 - Age of First Use: 25 2 - Amount (size/oz): unknown but reports its alot 2 - Frequency: daily 2 - Duration: n/a 2 - Last Use / Amount: 09/19/22 2 - Method of Aquiring: n/a 2 - Route of Substance Use: n/a                     ASAM's:  Six Dimensions of Multidimensional Assessment  Dimension 1:  Acute Intoxication  and/or Withdrawal Potential:      Dimension 2:  Biomedical Conditions and Complications:      Dimension 3:  Emotional, Behavioral, or Cognitive Conditions and Complications:     Dimension 4:  Readiness to Change:     Dimension 5:  Relapse, Continued use, or Continued Problem Potential:     Dimension 6:  Recovery/Living Environment:     ASAM Severity Score:    ASAM Recommended Level of Treatment: ASAM Recommended Level of Treatment: Level III Residential Treatment   Substance use Disorder (SUD) Substance Use Disorder (SUD)  Checklist Symptoms of Substance Use: Continued use despite having a persistent/recurrent physical/psychological problem caused/exacerbated by use, Continued use despite persistent or recurrent social, interpersonal problems, caused or exacerbated by use  Recommendations for Services/Supports/Treatments: Recommendations for Services/Supports/Treatments Recommendations For Services/Supports/Treatments: Medication Management, Individual Therapy, Inpatient Hospitalization  Discharge Disposition:    DSM5 Diagnoses: Patient Active Problem List   Diagnosis Date Noted   Syphilis (acquired) 02/23/2022   Depression 03/31/2021   Rectal bleeding 08/27/2020   Rectal discharge 08/27/2020   Bloating 08/27/2020   Constipation 08/25/2020   Routine screening for STI (sexually transmitted infection) 04/22/2019   Encounter for long-term (current) use of high-risk medication 04/22/2019   Major depressive disorder, recurrent episode, mild (HCC) 10/13/2018   Human immunodeficiency virus (HIV) disease (HCC) 07/23/2018   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     Referrals to Alternative  Service(s): Referred to Alternative Service(s):   Place:   Date:   Time:    Referred to Alternative Service(s):   Place:   Date:   Time:    Referred to Alternative Service(s):   Place:   Date:   Time:    Referred to Alternative Service(s):   Place:   Date:   Time:     Dava Najjar, MA,LCMHCA,NCC

## 2022-09-20 NOTE — Progress Notes (Signed)
   09/20/22 1814  [facility]UC Triage Screening (Walk-ins at [facility] only)  How Did You Hear About [place]? Family/Friend  What Is the Reason for Your Visit/Call Today? Pt presents to [facility] voluntarily accompanied by his mother. Per pts reports he has been previously diagnosed with Borderline personality disorder, Schizoaffective disorder, PTSD,ADHD, anxiety, MDD, conduct disorder. Pt reports he has been without medication for about 1 year.  Pt reports daily Marijuana use, about 1 gram. Pt reports occasional use of alcohol and meth, last use of meth was yesterday "1 puff". Pt reports he was arrested yesterday for shop lifting and was released today, and referred for a mental health evaluation and detox. Pt reports 4 past suicide attempts as a juvenile, and was hospitalized about 3 times. Last inpatient admission was at age 75 at [facility].  Pt reports passive SI "I don't wanna be here", but denies any plans or intent to harm himself.Pts mother reports the pt has a doctors appointment on 5/23 with his MD for his medical symptoms, pt is diagnosed with HIV. Pt reports he lives alone.Pt denies SI/HI and AVH currently.  How Long Has This Been Causing You Problems? 1 wk - 1 month  Have You Recently Had Any Thoughts About Hurting Yourself? Yes  How long ago did you have thoughts about hurting yourself? passive SI this week.  Are You Planning to Commit Suicide/Harm Yourself At This time? No  Have you Recently Had Thoughts About Hurting Someone Karolee Ohs? No  Are You Planning To Harm Someone At This Time? No  Are you currently experiencing any auditory, visual or other hallucinations? No  Have You Used Any Alcohol or Drugs in the Past 24 Hours? Yes  How long ago did you use Drugs or Alcohol? yesterday  What Did You Use and How Much? t reports daily Marijuana use, about 1 gram. Pt reports occasional use of alcohol and meth, last use of meth was yesterday "1 puff"  Do you have any current medical co-morbidities that require immediate  attention? No  Clinician description of patient physical appearance/behavior: calm, cooperative, casually dressed  What Do You Feel Would Help You the Most Today? Treatment for Depression or other mood problem;Alcohol or Drug Use Treatment  If access to [facility] Urgent [facility] was not available, would you have sought care in the Emergency Department? No  Determination of Need Urgent (48 hours)  Options For Referral [facility] Urgent Care;Medication Management;Outpatient Therapy

## 2022-09-21 ENCOUNTER — Other Ambulatory Visit (HOSPITAL_COMMUNITY)
Admission: EM | Admit: 2022-09-21 | Discharge: 2022-09-21 | Disposition: A | Discharge status: Other Acute Inpatient Hospital | Payer: 59 | Attending: Psychiatry | Admitting: Psychiatry

## 2022-09-21 ENCOUNTER — Other Ambulatory Visit (HOSPITAL_COMMUNITY)
Admission: EM | Admit: 2022-09-21 | Discharge: 2022-09-27 | Disposition: A | Discharge status: Home / Self Care | Payer: 59 | Attending: Psychiatry | Admitting: Psychiatry

## 2022-09-21 ENCOUNTER — Encounter (HOSPITAL_COMMUNITY): Payer: Self-pay | Admitting: Registered Nurse

## 2022-09-21 DIAGNOSIS — F200 Paranoid schizophrenia: Secondary | ICD-10-CM | POA: Insufficient documentation

## 2022-09-21 DIAGNOSIS — F1510 Other stimulant abuse, uncomplicated: Secondary | ICD-10-CM | POA: Diagnosis present

## 2022-09-21 DIAGNOSIS — F603 Borderline personality disorder: Secondary | ICD-10-CM | POA: Insufficient documentation

## 2022-09-21 DIAGNOSIS — F259 Schizoaffective disorder, unspecified: Secondary | ICD-10-CM | POA: Diagnosis not present

## 2022-09-21 DIAGNOSIS — F909 Attention-deficit hyperactivity disorder, unspecified type: Secondary | ICD-10-CM | POA: Diagnosis not present

## 2022-09-21 DIAGNOSIS — F319 Bipolar disorder, unspecified: Secondary | ICD-10-CM | POA: Insufficient documentation

## 2022-09-21 DIAGNOSIS — F1220 Cannabis dependence, uncomplicated: Secondary | ICD-10-CM

## 2022-09-21 DIAGNOSIS — F913 Oppositional defiant disorder: Secondary | ICD-10-CM | POA: Diagnosis not present

## 2022-09-21 DIAGNOSIS — F17290 Nicotine dependence, other tobacco product, uncomplicated: Secondary | ICD-10-CM | POA: Diagnosis not present

## 2022-09-21 DIAGNOSIS — F3130 Bipolar disorder, current episode depressed, mild or moderate severity, unspecified: Secondary | ICD-10-CM | POA: Diagnosis present

## 2022-09-21 DIAGNOSIS — F4310 Post-traumatic stress disorder, unspecified: Secondary | ICD-10-CM | POA: Diagnosis not present

## 2022-09-21 DIAGNOSIS — F314 Bipolar disorder, current episode depressed, severe, without psychotic features: Secondary | ICD-10-CM

## 2022-09-21 DIAGNOSIS — F1290 Cannabis use, unspecified, uncomplicated: Secondary | ICD-10-CM | POA: Insufficient documentation

## 2022-09-21 DIAGNOSIS — F1520 Other stimulant dependence, uncomplicated: Secondary | ICD-10-CM

## 2022-09-21 DIAGNOSIS — Z9151 Personal history of suicidal behavior: Secondary | ICD-10-CM | POA: Diagnosis not present

## 2022-09-21 DIAGNOSIS — F251 Schizoaffective disorder, depressive type: Secondary | ICD-10-CM

## 2022-09-21 DIAGNOSIS — F1910 Other psychoactive substance abuse, uncomplicated: Secondary | ICD-10-CM | POA: Diagnosis present

## 2022-09-21 DIAGNOSIS — B20 Human immunodeficiency virus [HIV] disease: Secondary | ICD-10-CM | POA: Diagnosis not present

## 2022-09-21 DIAGNOSIS — F3289 Other specified depressive episodes: Secondary | ICD-10-CM

## 2022-09-21 DIAGNOSIS — F419 Anxiety disorder, unspecified: Secondary | ICD-10-CM | POA: Insufficient documentation

## 2022-09-21 LAB — CBC WITH DIFFERENTIAL/PLATELET
Abs Immature Granulocytes: 0.02 10*3/uL (ref 0.00–0.07)
Basophils Absolute: 0 10*3/uL (ref 0.0–0.1)
Basophils Relative: 0 %
Eosinophils Absolute: 0.1 10*3/uL (ref 0.0–0.5)
Eosinophils Relative: 1 %
HCT: 41 % (ref 39.0–52.0)
Hemoglobin: 13.6 g/dL (ref 13.0–17.0)
Immature Granulocytes: 0 %
Lymphocytes Relative: 28 %
Lymphs Abs: 2.6 10*3/uL (ref 0.7–4.0)
MCH: 29.4 pg (ref 26.0–34.0)
MCHC: 33.2 g/dL (ref 30.0–36.0)
MCV: 88.6 fL (ref 80.0–100.0)
Monocytes Absolute: 0.9 10*3/uL (ref 0.1–1.0)
Monocytes Relative: 10 %
Neutro Abs: 5.6 10*3/uL (ref 1.7–7.7)
Neutrophils Relative %: 61 %
Platelets: 275 10*3/uL (ref 150–400)
RBC: 4.63 MIL/uL (ref 4.22–5.81)
RDW: 12.8 % (ref 11.5–15.5)
WBC: 9.3 10*3/uL (ref 4.0–10.5)
nRBC: 0 % (ref 0.0–0.2)

## 2022-09-21 LAB — URINALYSIS, ROUTINE W REFLEX MICROSCOPIC
Bilirubin Urine: NEGATIVE
Glucose, UA: NEGATIVE mg/dL
Hgb urine dipstick: NEGATIVE
Ketones, ur: 5 mg/dL — AB
Leukocytes,Ua: NEGATIVE
Nitrite: NEGATIVE
Protein, ur: NEGATIVE mg/dL
Specific Gravity, Urine: 1.029 (ref 1.005–1.030)
pH: 6 (ref 5.0–8.0)

## 2022-09-21 LAB — LIPID PANEL
Cholesterol: 124 mg/dL (ref 0–200)
HDL: 39 mg/dL — ABNORMAL LOW (ref >40)
LDL Cholesterol: 68 mg/dL (ref 0–99)
Total CHOL/HDL Ratio: 3.2 RATIO
Triglycerides: 84 mg/dL (ref <150)
VLDL: 17 mg/dL (ref 0–40)

## 2022-09-21 LAB — POCT URINE DRUG SCREEN - MANUAL ENTRY (I-SCREEN)
POC Amphetamine UR: POSITIVE — AB
POC Buprenorphine (BUP): NOT DETECTED
POC Cocaine UR: NOT DETECTED
POC Marijuana UR: POSITIVE — AB
POC Methadone UR: NOT DETECTED — AB
POC Methamphetamine UR: POSITIVE — AB
POC Morphine: NOT DETECTED — AB
POC Oxazepam (BZO): POSITIVE — AB
POC Oxycodone UR: NOT DETECTED
POC Secobarbital (BAR): NOT DETECTED

## 2022-09-21 LAB — HEMOGLOBIN A1C
Hgb A1c MFr Bld: 5.5 % (ref 4.8–5.6)
Mean Plasma Glucose: 111.15 mg/dL

## 2022-09-21 LAB — COMPREHENSIVE METABOLIC PANEL
ALT: 19 U/L (ref 0–44)
AST: 23 U/L (ref 15–41)
Albumin: 3.9 g/dL (ref 3.5–5.0)
Alkaline Phosphatase: 70 U/L (ref 38–126)
Anion gap: 11 (ref 5–15)
BUN: 13 mg/dL (ref 6–20)
CO2: 24 mmol/L (ref 22–32)
Calcium: 8.7 mg/dL — ABNORMAL LOW (ref 8.9–10.3)
Chloride: 102 mmol/L (ref 98–111)
Creatinine, Ser: 0.85 mg/dL (ref 0.61–1.24)
GFR, Estimated: >60 mL/min (ref >60)
Glucose, Bld: 96 mg/dL (ref 70–99)
Potassium: 3.7 mmol/L (ref 3.5–5.1)
Sodium: 137 mmol/L (ref 135–145)
Total Bilirubin: 0.8 mg/dL (ref 0.3–1.2)
Total Protein: 6.9 g/dL (ref 6.5–8.1)

## 2022-09-21 LAB — TSH: TSH: 0.487 u[IU]/mL (ref 0.350–4.500)

## 2022-09-21 LAB — ETHANOL: Alcohol, Ethyl (B): <10 mg/dL (ref <10)

## 2022-09-21 LAB — MAGNESIUM: Magnesium: 2.2 mg/dL (ref 1.7–2.4)

## 2022-09-21 MED ORDER — MAGNESIUM HYDROXIDE 400 MG/5ML PO SUSP: 30.0000 mL | Freq: Every day | ORAL | Status: DC | PRN

## 2022-09-21 MED ORDER — ALUM & MAG HYDROXIDE-SIMETH 200-200-20 MG/5ML PO SUSP
30.0000 mL | ORAL | Status: DC | PRN
Start: 2022-09-21 — End: 2022-09-21

## 2022-09-21 MED ORDER — TRAZODONE HCL 50 MG PO TABS
50.0000 mg | ORAL_TABLET | Freq: Every evening | ORAL | Status: DC | PRN
  Administered 2022-09-22 – 2022-09-24 (×2): 50 mg via ORAL
  Filled 2022-09-21 (×2): qty 1

## 2022-09-21 MED ORDER — ALUM & MAG HYDROXIDE-SIMETH 200-200-20 MG/5ML PO SUSP: 30.0000 mL | ORAL | Status: DC | PRN

## 2022-09-21 MED ORDER — MAGNESIUM HYDROXIDE 400 MG/5ML PO SUSP
30.0000 mL | Freq: Every day | ORAL | Status: DC | PRN
  Administered 2022-09-25: 30 mL via ORAL
  Filled 2022-09-21: qty 30

## 2022-09-21 MED ORDER — HYDROXYZINE HCL 25 MG PO TABS
25.0000 mg | ORAL_TABLET | Freq: Three times a day (TID) | ORAL | Status: DC | PRN
  Administered 2022-09-21 – 2022-09-26 (×6): 25 mg via ORAL
  Filled 2022-09-21 (×6): qty 1

## 2022-09-21 MED ORDER — ACETAMINOPHEN 325 MG PO TABS
650.0000 mg | ORAL_TABLET | Freq: Four times a day (QID) | ORAL | Status: DC | PRN
  Administered 2022-09-21 – 2022-09-27 (×4): 650 mg via ORAL
  Filled 2022-09-21 (×4): qty 2

## 2022-09-21 MED ORDER — ACETAMINOPHEN 325 MG PO TABS: 650.0000 mg | ORAL_TABLET | Freq: Four times a day (QID) | ORAL | Status: DC | PRN

## 2022-09-21 MED ORDER — BICTEGRAVIR-EMTRICITAB-TENOFOV 50-200-25 MG PO TABS
1.0000 | ORAL_TABLET | Freq: Every day | ORAL | Status: DC
  Administered 2022-09-21 – 2022-09-27 (×7): 1 via ORAL
  Filled 2022-09-21 (×7): qty 1

## 2022-09-21 MED ORDER — HYDROXYZINE HCL 25 MG PO TABS
25.0000 mg | ORAL_TABLET | Freq: Three times a day (TID) | ORAL | Status: DC | PRN
Start: 2022-09-21 — End: 2022-09-21

## 2022-09-21 MED ORDER — RISPERIDONE 1 MG PO TABS
1.0000 mg | ORAL_TABLET | Freq: Once | ORAL | Status: AC
  Administered 2022-09-21: 1 mg via ORAL
  Filled 2022-09-21: qty 1

## 2022-09-21 MED ORDER — PALIPERIDONE PALMITATE ER 234 MG/1.5ML IM SUSY
234.0000 mg | PREFILLED_SYRINGE | Freq: Once | INTRAMUSCULAR | Status: AC
  Administered 2022-09-21: 234 mg via INTRAMUSCULAR
  Filled 2022-09-21: qty 1.5

## 2022-09-21 NOTE — ED Notes (Signed)
Lab called to pick urine up

## 2022-09-21 NOTE — Progress Notes (Signed)
Received Devon Hernandez from OBS, alert and was cooperative with the admission process. He was oriented to his new environment and medicated per order. He endorsed feeling anxious and depressed at the present time. He denied feeling suicidal. He stated sometimes he see and hear other people, but not now. He called his mother and put her on the list for updates. His affect is flat and anxious.

## 2022-09-21 NOTE — Discharge Instructions (Signed)
Based on the information that you have provided and the presenting issues outpatient services and resources for have been recommended.  It is imperative that you follow through with treatment recommendations within 5-7 days from the of discharge to mitigate further risk to your safety and mental well-being. A list of referrals has been provided below to get you started.  You are not limited to the list provided.  In case of an urgent crisis, you may contact the Mobile Crisis Unit with [facility] at [phone].  Appointment Time: June 4th, 8am  [facility] [address]  [place], [place] [address]  Phone: [phone]  Fax: [phone]  [email or web address]  Please arrive on time and plan to stay at the center for 2-3 hours.

## 2022-09-21 NOTE — ED Provider Notes (Signed)
Facility Based Crisis Admission H&P  Date: 09/21/22 Patient Name: Devon Hernandez MRN: [id number] Chief Complaint: Substance use and detox  Diagnoses:  Final diagnoses:  None    HPI: Devon Hernandez 27 y.o. male patient with a reported  psychiatric history of paranoid schizophrenia, PTSD, ODD, Schizoaffective disorder, ADHD, and borderline personality disorder admitted to facility base crisis unit after present presented to [facility] as a walk in accompanied by his mother with complaints of needing detox and to be restarted on psychotropic medications  Devon Hernandez, 27 y.o., male patient seen face to face by this provider, consulted with Dr. Nelly Rout; and chart reviewed on 09/21/22.  On evaluation Devon Hernandez reports he was ordered by the court that he needed to go to a 7 day detox program.  States he has to return to court on 10/03/22 for larceny charges.  Patient states that he has been using marijuana daily since the age of 54 and started using meth about a year ago around the same time he stop taking his psychotropic medications.  Patient states his last use of meth was yesterday when he took one puff.  States that meth use is not daily.  Reports he usually experience some paranoia after use but denies at this time.  Patient states he has had at least 3 psychiatric hospitalization last when he was 27 yr old, and 4 suicide attempts.  Currently he denies suicidal/self-harm/homicidal ideation, psychosis, and paranoia.  Patient also reports that he is HIV + and has been off of his medications for 3-4 months.   During evaluation Devon Hernandez is sitting in chair with no noted distress.  He is alert/oriented x 4, calm, cooperative, attentive, and responses were relevant and appropriate to assessment questions.  He spoke in a clear tone at moderate volume, and normal pace, with good eye contact.   He denies suicidal/self-harm/homicidal ideation, psychosis, and paranoia.  Objectively:  there is no evidence of  psychosis/mania or delusional thinking.  He conversed coherently, with goal directed thoughts, and no distractibility, or pre-occupation.   PHQ 2-9:  Flowsheet Row Office Visit from 03/31/2021 in [facility] for Infectious Disease Office Visit from 02/12/2020 in [facility] for Infectious Disease  Thoughts that you would be better off dead, or of hurting yourself in some way Not at all Not at all  PHQ-9 Total Score 21 21       Flowsheet Row ED from 09/21/2022 in [facility] ED from 09/20/2022 in [facility] ED from 05/30/2021 in [facility] RISK CATEGORY No Risk No Risk No Risk         Total Time spent with patient: 45 minutes  Musculoskeletal  Strength & Muscle Tone: within normal limits Gait & Station: normal Patient leans: N/A  Psychiatric Specialty Exam  Presentation General Appearance:  Appropriate for Environment; Casual  Eye Contact: Good  Speech: Clear and Coherent; Normal Rate  Speech Volume: Normal  Handedness: Right   Mood and Affect  Mood: Anxious; Dysphoric  Affect: Congruent   Thought Process  Thought Processes: Coherent; Goal Directed  Descriptions of Associations:Intact  Orientation:Full (Time, Place and Person)  Thought Content:Logical  Diagnosis of Schizophrenia or Schizoaffective disorder in past: Yes  Duration of Psychotic Symptoms: Greater than six months  Hallucinations:Hallucinations: None  Ideas of Reference:None  Suicidal Thoughts:Suicidal Thoughts: No  Homicidal Thoughts:Homicidal Thoughts: No   Sensorium  Memory:  Immediate Good; Recent Good  Judgment: Fair  Insight: Fair; Present   Executive Functions  Concentration: Good  Attention Span: Good  Recall: Good  Fund of Knowledge: Good  Language: Good   Psychomotor Activity  Psychomotor Activity: Psychomotor Activity:  Normal   Assets  Assets: Communication Skills; Desire for Improvement; Financial Resources/Insurance; Housing; Leisure Time; Resilience; Social Support   Sleep  Sleep: Sleep: Fair   Nutritional Assessment (For OBS and FBC admissions only) Has the patient had a weight loss or gain of 10 pounds or more in the last 3 months?: No Has the patient had a decrease in food intake/or appetite?: No Does the patient have dental problems?: No Does the patient have eating habits or behaviors that may be indicators of an eating disorder including binging or inducing vomiting?: No Has the patient recently lost weight without trying?: 0 Has the patient been eating poorly because of a decreased appetite?: 0 Malnutrition Screening Tool Score: 0    Physical Exam Vitals and nursing note reviewed. Chaperone present: Mother present.  Constitutional:      General: He is not in acute distress.    Appearance: Normal appearance. He is not ill-appearing.  HENT:     Head: Normocephalic.  Eyes:     Conjunctiva/sclera: Conjunctivae normal.  Cardiovascular:     Rate and Rhythm: Normal rate.  Pulmonary:     Effort: Pulmonary effort is normal.  Musculoskeletal:        General: Normal range of motion.     Cervical back: Normal range of motion.  Skin:    General: Skin is warm and dry.  Neurological:     Mental Status: He is alert and oriented to person, place, and time.  Psychiatric:        Attention and Perception: Attention and perception normal. He does not perceive auditory or visual hallucinations.        Mood and Affect: Affect normal. Mood is anxious and depressed.        Speech: Speech normal.        Behavior: Behavior normal. Behavior is cooperative.        Thought Content: Thought content normal. Thought content is not paranoid or delusional. Thought content does not include homicidal or suicidal ideation.        Cognition and Memory: Cognition normal.        Judgment: Judgment is impulsive.     Review of Systems  Constitutional:        No other complaints voiced  Psychiatric/Behavioral:  Depression: Stable. Hallucinations: Denies. Substance abuse: Meth several times week "a puff" for lone year; and Marijuana daily since the age of 54. Suicidal ideas: Denies. Nervous/anxious: Stable. Insomnia: Sleep is fair.        States he has a history of schizophrenia and has been off  medications for a year.    All other systems reviewed and are negative.   Blood pressure 125/72, pulse (!) 107, temperature 98.6 F (37 C), resp. rate 18, SpO2 100 %. There is no height or weight on file to calculate BMI.  Past Psychiatric History:  Patient Active Problem List   Diagnosis Date Noted   Bipolar affective disorder, current episode depressed (HCC) 09/21/2022   Borderline personality disorder (HCC) 09/21/2022   Methamphetamine use disorder, moderate, in controlled environment (HCC) 09/21/2022   Cannabis use disorder, severe, in controlled environment, dependence (HCC) 09/21/2022   Schizoaffective disorder (HCC) 09/21/2022   Schizoaffective disorder, depressive type (HCC) 09/21/2022   Syphilis (acquired)  02/23/2022   Depression 03/31/2021   Rectal bleeding 08/27/2020   Rectal discharge 08/27/2020   Bloating 08/27/2020   Constipation 08/25/2020   Routine screening for STI (sexually transmitted infection) 04/22/2019   Encounter for long-term (current) use of high-risk medication 04/22/2019   Major depressive disorder, recurrent episode, mild (HCC) 10/13/2018   Human immunodeficiency virus (HIV) disease (HCC) 07/23/2018   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 No   Has the patient been a risk to self in the past 6 months? No .    Has the patient been a risk to self within the distant past? No   Is the patient a risk to others? No   Has the patient been a risk to others in the past 6 months? No   Has the patient been a risk to others within the distant past? No   Past Medical History:  Past Medical History:  Diagnosis Date   ADHD (attention deficit hyperactivity disorder)    Allergy    Anxiety    Bipolar disorder (HCC)    Depression    Headache(784.0)    HIV (human immunodeficiency virus infection) (HCC) 07/05/2018   Immune deficiency disorder (HCC)    Rectal abscess     Family History:  Family History  Problem Relation Age of Onset   Depression Mother    Hypertension Mother    Depression Father        Hx: PTSD   Diabetes Maternal Aunt    AVM Maternal Grandmother    Aneurysm Maternal Grandmother    Seizures Maternal Grandmother    Stomach cancer Neg Hx    Pancreatic cancer Neg Hx    Colon cancer Neg Hx    Liver disease Neg Hx    Esophageal cancer Neg Hx    Rectal cancer Neg Hx      Social History:  Social History   Tobacco Use   Smoking status: Every Day    Types: Cigars   Smokeless tobacco: Never   Tobacco comments:    smokes every couple of months    Vaping Use   Vaping Use: Every day  Substance Use Topics   Alcohol use: Yes    Alcohol/week: 6.0 standard drinks of alcohol    Types: 6 Standard drinks or equivalent per week   Drug use: Yes    Types: Marijuana     Last Labs:  No visits with results within 6 Month(s) from this visit.  Latest known visit with results is:  Office Visit on 02/23/2022  Component Date Value Ref Range Status   HIV 1 RNA Quant 02/23/2022 <20 (H)  Copies/mL Final   HIV-1 RNA Detected   HIV-1 RNA Quant, Log 02/23/2022 <1.30 (H)  Log cps/mL Final   Comment: HIV-1 RNA Detected . HIV-1 RNA was detected below 20 copies/mL. Viral nucleic acid detected below this level cannot be quantified  by the assay. . . Reference Range:                           Not Detected     copies/mL  Not Detected Log copies/mL . Marland Kitchen The test was performed using Real-Time Polymerase Chain Reaction. . . Reportable Range: 20 copies/mL to 10,000,000 copies/mL (1.30 Log copies/mL to 7.00 Log copies/mL). .    CD4 T Cell Abs 02/23/2022 1,765  400 - 1,790 /uL Final   CD4 % Helper T Cell 02/23/2022 46  33 - 65 % Final   Performed at [facility], [address] [place], [place] [address]   Neisseria Gonorrhea 02/23/2022 Negative   Final   Chlamydia 02/23/2022 Negative   Final   Comment 02/23/2022 Normal Reference Ranger Chlamydia - Negative   Final   Comment 02/23/2022 Normal Reference Range Neisseria Gonorrhea - Negative   Final   Neisseria Gonorrhea 02/23/2022 Negative   Final   Chlamydia 02/23/2022 Negative   Final   Comment 02/23/2022 Normal Reference Ranger Chlamydia - Negative   Final   Comment 02/23/2022 Normal Reference Range Neisseria Gonorrhea - Negative   Final   Neisseria Gonorrhea 02/23/2022 Negative   Final   Chlamydia 02/23/2022 Negative   Final   Comment 02/23/2022 Normal Reference Ranger Chlamydia - Negative   Final   Comment 02/23/2022 Normal Reference Range Neisseria Gonorrhea - Negative   Final    Allergies: Patient has no active allergies.  Medications:  Facility Ordered Medications  Medication   mupirocin ointment (BACTROBAN) 2 %   acetaminophen (TYLENOL) tablet 650 mg   alum & mag hydroxide-simeth (MAALOX/MYLANTA) 200-200-20 MG/5ML suspension 30 mL   magnesium hydroxide (MILK OF MAGNESIA) suspension 30 mL   hydrOXYzine (ATARAX) tablet 25 mg   traZODone (DESYREL) tablet 50 mg   risperiDONE (RISPERDAL) tablet 1 mg   paliperidone (INVEGA SUSTENNA) injection 234 mg   PTA Medications  Medication Sig   mirtazapine (REMERON) 15 MG tablet Take 15 mg by mouth at bedtime.   lithium carbonate 300 MG capsule Take 300 mg by mouth 2  (two) times daily with a meal. (Patient not taking: Reported on 07/22/2021)   hydrOXYzine (ATARAX/VISTARIL) 25 MG tablet Take 25 mg by mouth 3 (three) times daily as needed.   lamoTRIgine (LAMICTAL) 25 MG tablet Take by mouth.   risperiDONE (RISPERDAL) 1 MG tablet Take 1.5 mg by mouth 2 (two) times daily.   BIKTARVY 50-200-25 MG TABS tablet TAKE 1 TABLET BY MOUTH 1 TIME A DAY. (Patient taking differently: 1 tablet daily.)    Long Term Goals: Improvement in symptoms so as ready for discharge  Short Term Goals: Patient will verbalize feelings in meetings with treatment team members., Patient will attend at least of 50% of the groups daily., Pt will complete the PHQ9 on admission, day 3 and discharge., Patient will participate in completing the [place] Suicide Severity Rating Scale, Patient will score a low risk of violence for 24 hours prior to discharge, and Patient will take medications as prescribed daily.  Medical Decision Making  Devon Hernandez was admitted to [facility] base crisis unit under the service of Nelly Rout, MD for Schizoaffective disorder, depressive type [facility]), crisis management, and stabilization. Routine labs ordered, which include Lab Orders         HIV-1 RNA quant-no reflex-bld         T-helper cells CD4/CD8 %         CBC with Differential/Platelet         Comprehensive metabolic panel         Hemoglobin A1c         Magnesium  Ethanol         Lipid panel         Prolactin         TSH         Urinalysis, Routine w reflex microscopic -Urine, Clean Catch         POCT Urine Drug Screen - (I-Screen)    Medication Management: Medications started Meds ordered this encounter  Medications   acetaminophen (TYLENOL) tablet 650 mg   alum & mag hydroxide-simeth (MAALOX/MYLANTA) 200-200-20 MG/5ML suspension 30 mL   magnesium hydroxide (MILK OF MAGNESIA) suspension 30 mL   hydrOXYzine (ATARAX) tablet 25 mg   traZODone (DESYREL)  tablet 50 mg   risperiDONE (RISPERDAL) tablet 1 mg   paliperidone (INVEGA SUSTENNA) injection 234 mg    Will maintain observation checks every 15 minutes for safety. Psychosocial education regarding relapse prevention and self-care; social and communication  Social work will consult with family for collateral information and discuss discharge and follow up plan.    Recommendations  Based on my evaluation the patient does not appear to have an emergency medical condition.  Miamarie Moll, NP 09/21/22  12:05 PM

## 2022-09-21 NOTE — ED Provider Notes (Signed)
FBC Progress Note  Date and Time: 09/22/2022 9:58 AM Name: Devon Hernandez MRN:  [id number]  Reason For Admission: Devon Hernandez is a 27 yo male w/ hx of PTSD, Schzoaffective Disorder, ODD, ADHD, substance induced psychosis, borderline personality disorder, and extensive substance abuse including most recently methamphetamine, nicotine, and THC admitted to [facility] for substance use detox and psychiatric stabilization.  Subjective:  Patient reports daily meth use for past year and THC use since he was a teenager. He reports having legal problems with his 2nd larceny charge and may be facing time in jail. He reports doing well today. He is eating appropriately. Reports some arm pain after getting Invega Sustenna injection. Denies SI/HI/AVH. Sleep was poor last night due to arm pain. Patient wants to resume mirtazapine.   Diagnosis:  Final diagnoses:  Methamphetamine use disorder, moderate, in controlled environment (HCC) [F15.20]  Other depression  Human immunodeficiency virus (HIV) disease (HCC)    Total Time spent with patient: 45 minutes  Past Psychiatric Hx: Previous Psych Diagnoses: PTSD, ODD, ADHD, methamphetamine induced psychosis, borderline personality disorder Prior inpatient treatment: reports not for several years Current/prior outpatient treatment:none Psychotherapy NW:[id number] History of suicide:denies History of homicide: denies Psychiatric medication history:multiple antipsychotics and antidepressants in the past Psychiatric medication compliance history:poor, self-discontinued for past year Neuromodulation history: denies Current Psychiatrist:none Current therapist: non  Family History:  Family History  Problem Relation Age of Onset   Depression Mother    Hypertension Mother    Depression Father        Hx: PTSD   Diabetes Maternal Aunt    AVM Maternal Grandmother    Aneurysm Maternal Grandmother    Seizures Maternal Grandmother    Stomach cancer Neg Hx    Pancreatic  cancer Neg Hx    Colon cancer Neg Hx    Liver disease Neg Hx    Esophageal cancer Neg Hx    Rectal cancer Neg Hx     Labs  Lab Results:     Latest Ref Rng & Units 09/21/2022   12:23 PM 12/15/2021   10:30 AM 07/29/2020    2:00 PM  CBC  WBC 4.0 - 10.5 K/uL 9.3  10.4  8.3   Hemoglobin 13.0 - 17.0 g/dL 08.6  57.8  46.9   Hematocrit 39.0 - 52.0 % 41.0  39.5  42.2   Platelets 150 - 400 K/uL 275  278  240       Latest Ref Rng & Units 09/21/2022   12:23 PM 12/15/2021   10:30 AM 07/29/2020    2:00 PM  CMP  Glucose 70 - 99 mg/dL 96  629  97   BUN 6 - 20 mg/dL 13  21  12    Creatinine 0.61 - 1.24 mg/dL 5.28  4.13  2.44   Sodium 135 - 145 mmol/L 137  141  140   Potassium 3.5 - 5.1 mmol/L 3.7  3.7  4.1   Chloride 98 - 111 mmol/L 102  106  104   CO2 22 - 32 mmol/L 24  27  27    Calcium 8.9 - 10.3 mg/dL 8.7  9.6  01.0   Total Protein 6.5 - 8.1 g/dL 6.9  7.4  7.1   Total Bilirubin 0.3 - 1.2 mg/dL 0.8  0.7  1.0   Alkaline Phos 38 - 126 U/L 70     AST 15 - 41 U/L 23  21  18    ALT 0 - 44 U/L 19  11  9  Physical Findings   AUDIT    Flowsheet Row [facility] Admission (Dis[facility]  Alcohol Use Disorder Identification Test Final Score (AUDIT) 0 0      PHQ2-9    Flowsheet Row ED from 09/21/2022 in [facility] Office Visit from 02/23/2022 in [facility] for Infectious Disease Office Visit from 03/31/2021 in [facility] for Infectious Disease Office Visit from 08/25/2020 in [facility] for Infectious Disease Office Visit from 02/12/2020 in [facility] for Infectious Disease  PHQ-2 Total Score 2 0 3 0 6  PHQ-9 Total Score 18 -- 21 -- 21      Flowsheet Row ED from 09/21/2022 in [facility] Most recent reading at 09/21/2022  2:42 PM  ED from 09/21/2022 in [facility] Most recent reading at 09/21/2022  9:22 AM ED from 09/20/2022 in [facility] Most recent reading at 09/20/2022  6:34 PM  C-SSRS RISK CATEGORY No Risk No Risk No Risk        Musculoskeletal  Strength & Muscle Tone: within normal limits Gait & Station: normal Patient leans: N/A  Psychiatric Specialty Exam  Presentation  General Appearance:  Appropriate for Environment; Casual   Eye Contact: Good   Speech: Clear and Coherent; Normal Rate   Speech Volume: Normal   Handedness: Right    Mood and Affect  Mood: Anxious; Dysphoric   Affect: Congruent    Thought Process  Thought Processes: Coherent; Goal Directed   Descriptions of Associations:Intact   Orientation:Full (Time, Place and Person)   Thought Content:Logical   Diagnosis of Schizophrenia or Schizoaffective disorder in past: Yes     Hallucinations:Hallucinations: None   Ideas of Reference:None   Suicidal Thoughts:Suicidal Thoughts: No   Homicidal Thoughts:Homicidal Thoughts: No    Sensorium  Memory: Immediate Good; Recent Good   Judgment: Fair   Insight: Fair; Present    Executive Functions  Concentration: Good   Attention Span: Good   Recall: Good   Fund of Knowledge: Good   Language: Good    Psychomotor Activity  Psychomotor Activity: Psychomotor Activity: Normal    Assets  Assets: Communication Skills; Desire for Improvement; Financial Resources/Insurance; Housing; Leisure Time; Resilience; Social Support    Sleep  Sleep: Sleep: Fair    Physical Exam  Physical Exam ROS Blood pressure 122/79, pulse 97, temperature 97.9 F (36.6 C), temperature source Oral, resp. rate 18, SpO2 100 %. There is no height or weight on file to calculate BMI.  ASSESSMENT Devon Hernandez is a 27 yo male w/ hx of PTSD, ODD, ADHD, substance induced psychosis, borderline  personality disorder, and extensive substance abuse including most recently methamphetamine, nicotine, and THC admitted to [facility] for substance use detox and psychiatric stabilization.  PLAN Schizoaffective disorder, depressive type PTSD Methamphetamine use disorder Substance-induced psychosis Cannabis use disorder Tobacco use disorder -Received Hinda Glatter Sustenna 256 IM on 09/21/2022 -Should receive second loading dose on 09/24/2022 -Hydroxyzine/trazodone as needed for anxiety/insomnia -Start mirtazapine 15 mg nightly for depression  HIV Restarted biktarvy  Dispo home in 3-4 days with OP resources   Park Pope, MD 09/22/2022 9:58 AM

## 2022-09-21 NOTE — ED Notes (Signed)
Nurse called the lab to see what tubes were needed for the labs that were put in on the patient and the lab tech told the nurse.later on after the blood had been sent the lab tech called back and stated that additional tubes of blood was needed and then told us about a yellow tube that we did not carry. Nurses were not informed about the big yellow tube previously.Two nurses were present during this conversation.

## 2022-09-21 NOTE — ED Notes (Addendum)
Rn called fbc to give report and bring patient over rn states that she is not ready for the admission . Patient will remain on obs side until rn is ready.Will continue to monitor for safety.

## 2022-09-21 NOTE — ED Notes (Signed)
Patient observed/assessed in room in bed appearing in no immediate distress resting peacefully. Q15 minute checks continued by MHT and nursing staff. Will continue to monitor and support. 

## 2022-09-21 NOTE — ED Notes (Signed)
Pt did not get up for dinner

## 2022-09-21 NOTE — Tx Team (Signed)
LCSW met with patient to assess current mood, affect, physical state, and inquire about needs/goals while here in [facility] and after discharge. Patient reports he presented due to needing help with detox from meth and marijuana use. Patient reports he has been using meth daily for the last year and a half. Patient reports he has been smoking marijuana since he was 26 years old. Patient denies ever seeking treatment in the past. Patient reports he has been diagnosed in the past with Schizophrenia, Bipolar disorder, schizoaffective. Patient reports having a prior history of psychiatric inpatient admissions and reports going to a [facility] back in 2015 where he stayed for 14 months.     Patient reports he/she has been living ****. Patient denies having access to transportation, and reports having limited social support. Patient reports his/her current goal is to seek residential placement/sober living/outpatient resources for substance use. Patient denies any prior history of outpatient or inpatient substance abuse treatment. Patient currently denies any SI/HI/AVH and reports mood as ***. Patient aware that LCSW will send referrals out for review and will follow up to provide updates as received. Patient expressed understanding and appreciation of LCSW assistance. No other needs were reported at this time by patient.    Fernande Boyden, LCSW Clinical Social Worker [facility] Ph: [phone]

## 2022-09-21 NOTE — Progress Notes (Signed)
   09/21/22 0918  [facility] Triage Screening (Walk-ins at [facility] only)  What Is the Reason for Your Visit/Call Today? Patient presents voluntarily with his mother for assessment.  He is requesting detox from meth, stating he is "court ordered" for a 7 day treatment program.  Patient reports daily meth use for the past 1.5 yrs.  He states he doesn't use much, "maybe $20 per week or week and a half."  He also uses THC daily.   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  Patient denies ETOH or other substance use.  He reports he has been diagnosed with "Schizophrenia, Schizoaffective and Bipolar" however has not been on medications Rx by [facility] for 1.5 years.  Patient denies SI, HI and AVH.  He does mention experiencing some paranoia "like something is getting ready to happen to me."  He last used meth around 12:30 am.  Patient is seeking residential SA treatment.  How Long Has This Been Causing You Problems? > than 6 months  Have You Recently Had Any Thoughts About Hurting Yourself? No  How long ago did you have thoughts about hurting yourself? Recent passive SI, denied SI currently  Are You Planning to Commit Suicide/Harm Yourself At This time? No  Have you Recently Had Thoughts About Hurting Someone Karolee Ohs? No  Are You Planning To Harm Someone At This Time? No  Explanation: N/A  Are you currently experiencing any auditory, visual or other hallucinations? No  Please explain the hallucinations you are currently experiencing: Denies current AVH  Have You Used Any Alcohol or Drugs in the Past 24 Hours? Yes  How long ago did you use Drugs or Alcohol? 12:30am  What Did You Use and How Much? "little bit of meth" and THC  Do you have any current medical co-morbidities that require immediate attention? No  What Do You Feel Would Help You the Most Today? Alcohol or Drug Use Treatment  If access to [facility] was not available, would you have sought care in the Emergency Department? No  Determination of  Need Routine (7 days)  Options For Referral Other: Comment (Residential SA treatment)

## 2022-09-21 NOTE — BH Assessment (Signed)
Comprehensive Clinical Assessment (CCA) Note  09/21/2022 Devon Hernandez [id number]  Disposition: Per Assunta Found, NP admission to [facility] is recommended.   The patient demonstrates the following risk factors for suicide: Chronic risk factors for suicide include: psychiatric disorder of Schizoaffective Disorder, unspecified, substance use disorder, and demographic factors (male, >27 y/o). Acute risk factors for suicide include: family or marital conflict. Protective factors for this patient include: positive social support, positive therapeutic relationship, responsibility to others (children, family), coping skills, and hope for the future. Considering these factors, the overall suicide risk at this point appears to be low. Patient is appropriate for outpatient follow up.  Patient is a @ year old male/male with a history of @,@,@ who presents voluntarily/involuntarily to [facility] for assessment.  Patient presents voluntarily with his mother for assessment.  He is requesting detox from meth, stating he is "court ordered" for a 7 day treatment program.  Patient reports daily meth use for the past 1.5 yrs.  He states he doesn't use much, "maybe $20 per week or week and a half."  He also uses THC daily.   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  Patient denies ETOH or other substance use.  He reports he has been diagnosed with "Schizophrenia, Schizoaffective and Bipolar" however has not been on medications Rx by [facility] for 1.5 years.  Patient denies SI, HI and AVH.  He does mention experiencing some paranoia "like something is getting ready to happen to me."  He last used meth around 12:30 am.  Patient is seeking residential SA treatment.   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 Self  What Is the Reason for Your Visit/Call Today? Patient presents voluntarily with his mother for assessment.  He is requesting detox from meth, stating he is "court ordered" for a 7 day treatment program.  Patient reports daily meth use for the past 1.5 yrs.  He states he doesn't use much, "maybe $20 per week or week and a half."  He also uses THC daily.   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  Patient denies ETOH or other substance use.  He reports he has been diagnosed with "Schizophrenia, Schizoaffective and Bipolar" however has not been on medications Rx by [facility] [place] for 1.5 years.  Patient denies SI, HI and AVH.  He does mention experiencing some paranoia "like something is getting ready to happen to me."  He last used meth around 12:30 am.  Patient is seeking residential SA treatment.  How Long Has This Been Causing You Problems? > than 6 months  What Do You Feel Would Help You the Most Today? Alcohol or Drug Use Treatment   Have You Recently Had Any Thoughts About Hurting Yourself? No  Are You Planning to Commit Suicide/Harm Yourself At This time? No   Flowsheet Row ED from 09/21/2022 in [facility] from 09/20/2022 in [facility] ED from 05/30/2021 in [facility] RISK CATEGORY No Risk No Risk No Risk  Have you Recently Had Thoughts About Hurting Someone Karolee Ohs? No  Are You Planning to Harm Someone at This Time? No  Explanation: N/A   Have You Used Any Alcohol or Drugs in the Past 24 Hours? Yes  What Did You Use and How Much? "little bit of meth" and THC   Do You Currently Have a Therapist/Psychiatrist? No  Name of Therapist/Psychiatrist: Name of Therapist/Psychiatrist: N/A   Have You Been Recently Discharged From Any Office Practice or Programs? No  Explanation of Discharge From  Practice/Program: N/A     CCA Screening Triage Referral Assessment Type of Contact: Face-to-Face  Telemedicine Service Delivery:   Is this Initial or Reassessment?   Date Telepsych consult ordered in [facility]:    Time Telepsych consult ordered in [facility]:    Location of Assessment: [facility] Assessment Services  Provider Location: [facility]   Collateral Involvement: Pt's mother : Brennan Bailey : [phone]   Does Patient Have a Court Appointed Legal Guardian? No  Legal Guardian Contact Information: N/A  Copy of Legal Guardianship Form: -- (N/A)  Legal Guardian Notified of Arrival: -- (N/A)  Legal Guardian Notified of Pending Discharge: -- (N/A)  If Minor and Not Living with Parent(s), Who has Custody? N/A  Is CPS involved or ever been involved? -- (N/A)  Is APS involved or ever been involved? -- (N/A)   Patient Determined To Be At Risk for Harm To Self or Others Based on Review of Patient Reported Information or Presenting Complaint? No  Method: -- (N/A, no HI)  Availability of Means: No access or NA  Intent: -- (N/A, no HI)  Notification Required: -- (N/A, no HI)  Additional Information for Danger to Others Potential: -- (N/A, no HI)  Additional Comments for Danger to Others Potential: N/A  Are There Guns or Other Weapons in Your Home? No  Types of Guns/Weapons: Pt denies access to guns/weapons  Are These Weapons Safely Secured?                            -- (N/A)  Who Could Verify You Are Able To Have These Secured: Pt denies access to guns/weapons  Do You Have any Outstanding Charges, Pending Court Dates, Parole/Probation? Pending: Larceny/shoplifting 10/04/22 ct date.  Contacted To Inform of Risk of Harm To Self or Others: -- (N/A)    Does Patient Present under Involuntary Commitment? No    [place] of Residence: [place]   Patient Currently Receiving the Following Services: Not Receiving Services   Determination of Need: Urgent (48  hours)   Options For Referral: Other: Comment; Facility-Based Crisis (Residential SA treatment)     CCA Biopsychosocial Patient Reported Schizophrenia/Schizoaffective Diagnosis in Past: Yes   Strengths: Pt's willing to seek treatment   Mental Health Symptoms Depression:   Sleep (too much or little); Worthlessness; Difficulty Concentrating   Duration of Depressive symptoms:  Duration of Depressive Symptoms: Greater than two weeks   Mania:   None   Anxiety:    Restlessness; Tension; Worrying   Psychosis:   Delusions (paranoia, possibly related to meth use)   Duration of Psychotic symptoms:  Duration of Psychotic Symptoms: Greater than six months   Trauma:   None   Obsessions:   None   Compulsions:   None   Inattention:   N/A   Hyperactivity/Impulsivity:   N/A   Oppositional/Defiant Behaviors:   N/A   Emotional Irregularity:   Chronic feelings of emptiness; Mood  lability   Other Mood/Personality Symptoms:   none    Mental Status Exam Appearance and self-care  Stature:   Average   Weight:   Thin   Clothing:   Casual   Grooming:   Normal   Cosmetic use:   None   Posture/gait:   Normal   Motor activity:   Not Remarkable   Sensorium  Attention:   Normal   Concentration:   Normal   Orientation:   X5   Recall/memory:   Normal   Affect and Mood  Affect:   Anxious   Mood:   Anxious   Relating  Eye contact:   Normal   Facial expression:   Anxious; Responsive   Attitude toward examiner:   Cooperative   Thought and Language  Speech flow:  Clear and Coherent   Thought content:   Appropriate to Mood and Circumstances; Delusions (mild paranoia)   Preoccupation:   None   Hallucinations:   None   Organization:   Coherent   [employer] of Knowledge:   Average   Intelligence:   Average   Abstraction:   Normal   Judgement:   Impaired   Reality Testing:   Distorted (d/t meth use this  am)   Insight:   Gaps   Decision Making:   Vacilates; Impulsive   Social Functioning  Social Maturity:   Irresponsible   Social Judgement:   Naive   Stress  Stressors:   Family conflict; Legal   Coping Ability:   Normal   Skill Deficits:   Communication; Interpersonal   Supports:   Family     Religion: Religion/Spirituality Are You A Religious Person?: Yes What is Your Religious Affiliation?: Christian How Might This Affect Treatment?: NA  Leisure/Recreation: Leisure / Recreation Do You Have Hobbies?: Yes Leisure and Hobbies: cook and listen to music  Exercise/Diet: Exercise/Diet Do You Exercise?: No Have You Gained or Lost A Significant Amount of Weight in the Past Six Months?: No Do You Follow a Special Diet?: No Do You Have Any Trouble Sleeping?: Yes Explanation of Sleeping Difficulties: Pt reports that he is not sleeping at night   CCA Employment/Education Employment/Work Situation: Employment / Work Situation Employment Situation: On disability Why is Patient on Disability: Mental Health How Long has Patient Been on Disability: 2023 Patient's Job has Been Impacted by Current Illness: No Describe how Patient's Job has Been Impacted: n/a Has Patient ever Been in the [employer]?: No  Education: Education Last Grade Completed: 12 Did You Attend College?: No Did You Have An Individualized Education Program (IIEP): No Did You Have Any Difficulty At School?: No Patient's Education Has Been Impacted by Current Illness: No   CCA Family/Childhood History Family and Relationship History: Family history Marital status: Single Does patient have children?: No  Childhood History:  Childhood History By whom was/is the patient raised?: Mother (Father absent from age [age].) Did patient suffer any verbal/emotional/physical/sexual abuse as a child?: No Did patient suffer from severe childhood neglect?: No Has patient ever been sexually  abused/assaulted/raped as an adolescent or adult?: No Was the patient ever a victim of a crime or a disaster?: No Witnessed domestic violence?: No Has patient been affected by domestic violence as an adult?: No       CCA Substance Use Alcohol/Drug Use: Alcohol / Drug Use Pain Medications: See MAR Prescriptions: See MAR Over the Counter: See MAR History of alcohol / drug use?: Yes Longest period of sobriety (when/how long): 4 days (  Jan 2024) Negative Consequences of Use: Financial, Personal relationships, Legal Withdrawal Symptoms: None Substance #1 Name of Substance 1: THC 1 - Age of First Use: 13 1 - Amount (size/oz): unknown 1 - Frequency: daily 1 - Duration: "years" 1 - Last Use / Amount: 5/22 - amt unknown 1 - Method of Aquiring: NA 1- Route of Use: smokes Substance #2 Name of Substance 2: Meth 2 - Age of First Use: 25 2 - Amount (size/oz): uses 2 - Frequency: daily 2 - Duration: unknown 2 - Last Use / Amount: 5/22 2 - Method of Aquiring: NA 2 - Route of Substance Use: smokes                     ASAM's:  Six Dimensions of Multidimensional Assessment  Dimension 1:  Acute Intoxication and/or Withdrawal Potential:   Dimension 1:  Description of individual's past and current experiences of substance use and withdrawal: mild intoxication noted - paranoia  Dimension 2:  Biomedical Conditions and Complications:   Dimension 2:  Description of patient's biomedical conditions and  complications: Adequate ability to cope with physical discomfort  Dimension 3:  Emotional, Behavioral, or Cognitive Conditions and Complications:  Dimension 3:  Description of emotional, behavioral, or cognitive conditions and complications: Untreated Schizophrenia, open to re-starting meds  Dimension 4:  Readiness to Change:  Dimension 4:  Description of Readiness to Change criteria: seeking treatment, however motivation is court ordered treatment  Dimension 5:  Relapse, Continued use, or  Continued Problem Potential:     Dimension 6:  Recovery/Living Environment:  Dimension 6:  Recovery/Iiving environment criteria description: parents are supportive  ASAM Severity Score:    ASAM Recommended Level of Treatment: ASAM Recommended Level of Treatment: Level III Residential Treatment   Substance use Disorder (SUD) Substance Use Disorder (SUD)  Checklist Symptoms of Substance Use: Continued use despite having a persistent/recurrent physical/psychological problem caused/exacerbated by use, Continued use despite persistent or recurrent social, interpersonal problems, caused or exacerbated by use  Recommendations for Services/Supports/Treatments: Recommendations for Services/Supports/Treatments Recommendations For Services/Supports/Treatments: Medication Management, Individual Therapy, Facility Based Crisis  Discharge Disposition:    DSM5 Diagnoses: Patient Active Problem List   Diagnosis Date Noted   Bipolar affective disorder, current episode depressed (HCC) 09/21/2022   Borderline personality disorder (HCC) 09/21/2022   Methamphetamine use disorder, moderate, in controlled environment (HCC) 09/21/2022   Cannabis use disorder, severe, in controlled environment, dependence (HCC) 09/21/2022   Schizoaffective disorder (HCC) 09/21/2022   Syphilis (acquired) 02/23/2022   Depression 03/31/2021   Rectal bleeding 08/27/2020   Rectal discharge 08/27/2020   Bloating 08/27/2020   Constipation 08/25/2020   Routine screening for STI (sexually transmitted infection) 04/22/2019   Encounter for long-term (current) use of high-risk medication 04/22/2019   Major depressive disorder, recurrent episode, mild (HCC) 10/13/2018   Human immunodeficiency virus (HIV) disease (HCC) 07/23/2018   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   Perianal wart 03/30/2017   Polysubstance abuse (HCC) 07/11/2013   MDD (major depressive disorder), recurrent episode, severe (HCC) 01/18/2013    ADHD (attention deficit hyperactivity disorder), 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

## 2022-09-21 NOTE — ED Notes (Signed)
Patient A&Ox4. Patient denies SI/HI and AVH. Patient reports right arm pain 10/10 using numeric pain scale. Patient is tearful at this time. Patient was medicated per PRN MAR orders.  No acute distress noted. Support and encouragement provided. Encouraged patient to notify staff if thoughts of harm toward self or others arise. Patient verbalize understanding and agreement. Q 15 minutes safety checks remain in place. Will continue to monitor for safety.

## 2022-09-21 NOTE — ED Provider Notes (Signed)
Facility Based Crisis Admission H&P  Date: 09/21/22 Patient Name: Devon Hernandez MRN: [id number] Chief Complaint: Substance use and detox  Diagnoses:  Final diagnoses:  Bipolar disorder, current episode depressed, severe, without psychotic features (HCC)  Methamphetamine use disorder, moderate, in controlled environment (HCC)  Cannabis use disorder, severe, in controlled environment, dependence (HCC)  Borderline personality disorder (HCC)    HPI: Devon Hernandez 27 y.o. male patient with a reported  psychiatric history of paranoid schizophrenia, PTSD, ODD, Schizoaffective disorder, ADHD, and borderline personality disorder admitted to facility base crisis unit after present presented to [facility] as a walk in accompanied by his mother with complaints of needing detox and to be restarted on psychotropic medications  Devon Hernandez, 27 y.o., male patient seen face to face by this provider, consulted with Dr. Nelly Rout; and chart reviewed on 09/21/22.  On evaluation Devon Hernandez reports he was ordered by the court that he needed to go to a 7 day detox program.  States he has to return to court on 10/03/22 for larceny charges.  Patient states that he has been using marijuana daily since the age of 65 and started using meth about a year ago around the same time he stop taking his psychotropic medications.  Patient states his last use of meth was yesterday when he took one puff.  States that meth use is not daily.  Reports he usually experience some paranoia after use but denies at this time.  Patient states he has had at least 3 psychiatric hospitalization last when he was 27 yr old, and 4 suicide attempts.  Currently he denies suicidal/self-harm/homicidal ideation, psychosis, and paranoia.  Patient also reports that he is HIV + and has been off of his medications for 3-4 months.   During evaluation Devon Hernandez is sitting in chair with no noted distress.  He is alert/oriented x 4, calm, cooperative, attentive,  and responses were relevant and appropriate to assessment questions.  He spoke in a clear tone at moderate volume, and normal pace, with good eye contact.   He denies suicidal/self-harm/homicidal ideation, psychosis, and paranoia.  Objectively:  there is no evidence of psychosis/mania or delusional thinking.  He conversed coherently, with goal directed thoughts, and no distractibility, or pre-occupation.   PHQ 2-9:  Flowsheet Row Office Visit from 03/31/2021 in [facility] for Infectious Disease Office Visit from 02/12/2020 in [facility] for Infectious Disease  Thoughts that you would be better off dead, or of hurting yourself in some way Not at all Not at all  PHQ-9 Total Score 21 21       Flowsheet Row ED from 09/21/2022 in [facility] ED from 09/20/2022 in [facility] ED from 05/30/2021 in [facility] at [facility] RISK CATEGORY No Risk No Risk No Risk         Total Time spent with patient: 45 minutes  Musculoskeletal  Strength & Muscle Tone: within normal limits Gait & Station: normal Patient leans: N/A  Psychiatric Specialty Exam  Presentation General Appearance:  Appropriate for Environment; Casual  Eye Contact: Good  Speech: Clear and Coherent; Normal Rate  Speech Volume: Normal  Handedness: Right   Mood and Affect  Mood: Anxious; Dysphoric  Affect: Congruent   Thought Process  Thought Processes: Coherent; Goal Directed  Descriptions of Associations:Intact  Orientation:Full (Time, Place and Person)  Thought Content:Logical  Diagnosis of Schizophrenia or Schizoaffective disorder in  past: Yes  Duration of Psychotic Symptoms: Greater than six months  Hallucinations:Hallucinations: None  Ideas of Reference:None  Suicidal Thoughts:Suicidal Thoughts: No  Homicidal Thoughts:Homicidal Thoughts: No   Sensorium  Memory: Immediate Good;  Recent Good  Judgment: Fair  Insight: Fair; Present   Executive Functions  Concentration: Good  Attention Span: Good  Recall: Good  Fund of Knowledge: Good  Language: Good   Psychomotor Activity  Psychomotor Activity: Psychomotor Activity: Normal   Assets  Assets: Communication Skills; Desire for Improvement; Financial Resources/Insurance; Housing; Leisure Time; Resilience; Social Support   Sleep  Sleep: Sleep: Fair   Nutritional Assessment (For OBS and FBC admissions only) Has the patient had a weight loss or gain of 10 pounds or more in the last 3 months?: No Has the patient had a decrease in food intake/or appetite?: No Does the patient have dental problems?: No Does the patient have eating habits or behaviors that may be indicators of an eating disorder including binging or inducing vomiting?: No Has the patient recently lost weight without trying?: 0 Has the patient been eating poorly because of a decreased appetite?: 0 Malnutrition Screening Tool Score: 0    Physical Exam Vitals and nursing note reviewed. Chaperone present: Mother present.  Constitutional:      General: He is not in acute distress.    Appearance: Normal appearance. He is not ill-appearing.  HENT:     Head: Normocephalic.  Eyes:     Conjunctiva/sclera: Conjunctivae normal.  Cardiovascular:     Rate and Rhythm: Normal rate.  Pulmonary:     Effort: Pulmonary effort is normal.  Musculoskeletal:        General: Normal range of motion.     Cervical back: Normal range of motion.  Skin:    General: Skin is warm and dry.  Neurological:     Mental Status: He is alert and oriented to person, place, and time.  Psychiatric:        Attention and Perception: Attention and perception normal. He does not perceive auditory or visual hallucinations.        Mood and Affect: Affect normal. Mood is anxious and depressed.        Speech: Speech normal.        Behavior: Behavior normal.  Behavior is cooperative.        Thought Content: Thought content normal. Thought content is not paranoid or delusional. Thought content does not include homicidal or suicidal ideation.        Cognition and Memory: Cognition normal.        Judgment: Judgment is impulsive.    Review of Systems  Constitutional:        No other complaints voiced  Psychiatric/Behavioral:  Depression: Stable. Hallucinations: Denies. Substance abuse: Meth several times week "a puff" for lone year; and Marijuana daily since the age of 53. Suicidal ideas: Denies. Nervous/anxious: Stable. Insomnia: Sleep is fair.        States he has a history of schizophrenia and has been off  medications for a year.    All other systems reviewed and are negative.   Blood pressure 126/63, pulse (!) 125, temperature 98.6 F (37 C), temperature source Oral, resp. rate 18, height 5\' 9"  (1.753 m), weight 125 lb (56.7 kg), SpO2 99 %. Body mass index is 18.46 kg/m.  Past Psychiatric History:  Patient Active Problem List   Diagnosis Date Noted   Bipolar affective disorder, current episode depressed (HCC) 09/21/2022   Borderline personality disorder (HCC) 09/21/2022  Methamphetamine use disorder, moderate, in controlled environment (HCC) 09/21/2022   Cannabis use disorder, severe, in controlled environment, dependence (HCC) 09/21/2022   Schizoaffective disorder (HCC) 09/21/2022   Syphilis (acquired) 02/23/2022   Depression 03/31/2021   Rectal bleeding 08/27/2020   Rectal discharge 08/27/2020   Bloating 08/27/2020   Constipation 08/25/2020   Routine screening for STI (sexually transmitted infection) 04/22/2019   Encounter for long-term (current) use of high-risk medication 04/22/2019   Major depressive disorder, recurrent episode, mild (HCC) 10/13/2018   Human immunodeficiency virus (HIV) disease (HCC) 07/23/2018   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 No  Has the patient been a risk to self in the past 6 months? No .    Has the patient been a risk to self within the distant past? No   Is the patient a risk to others? No   Has the patient been a risk to others in the past 6 months? No   Has the patient been a risk to others within the distant past? No   Past Medical History:  Past Medical History:  Diagnosis Date   ADHD (attention deficit hyperactivity disorder)    Allergy    Anxiety    Bipolar disorder (HCC)    Depression    Headache(784.0)    HIV (human immunodeficiency virus infection) (HCC) 07/05/2018   Immune deficiency disorder (HCC)    Rectal abscess     Family History:  Family History  Problem Relation Age of Onset   Depression Mother    Hypertension Mother    Depression Father        Hx: PTSD   Diabetes Maternal Aunt    AVM Maternal Grandmother    Aneurysm Maternal Grandmother    Seizures Maternal Grandmother    Stomach cancer Neg Hx    Pancreatic cancer Neg Hx    Colon cancer Neg Hx    Liver disease Neg Hx    Esophageal cancer Neg Hx    Rectal cancer Neg Hx      Social History:  Social History   Tobacco Use   Smoking status: Every Day    Types: Cigars   Smokeless tobacco: Never   Tobacco comments:    smokes every couple of months    Vaping Use   Vaping Use: Every day  Substance Use Topics   Alcohol use: Yes    Alcohol/week: 6.0 standard drinks of alcohol    Types: 6 Standard drinks or equivalent per week   Drug use: Yes    Types: Marijuana     Last Labs:  No visits with results within 6 Month(s) from this visit.  Latest known visit with results is:  Office Visit on 02/23/2022  Component Date Value Ref Range Status   HIV 1 RNA Quant  02/23/2022 <20 (H)  Copies/mL Final   HIV-1 RNA Detected   HIV-1 RNA Quant, Log 02/23/2022 <1.30 (H)  Log cps/mL Final   Comment: HIV-1 RNA Detected . HIV-1 RNA was detected below 20 copies/mL. Viral nucleic acid detected below this level cannot be quantified by the assay. . . Reference Range:  Not Detected     copies/mL                           Not Detected Log copies/mL . Marland Kitchen The test was performed using Real-Time Polymerase Chain Reaction. . . Reportable Range: 20 copies/mL to 10,000,000 copies/mL (1.30 Log copies/mL to 7.00 Log copies/mL). .    CD4 T Cell Abs 02/23/2022 1,765  400 - 1,790 /uL Final   CD4 % Helper T Cell 02/23/2022 46  33 - 65 % Final   Performed at [facility], [address], [place], [place] [address]   Neisseria Gonorrhea 02/23/2022 Negative   Final   Chlamydia 02/23/2022 Negative   Final   Comment 02/23/2022 Normal Reference Ranger Chlamydia - Negative   Final   Comment 02/23/2022 Normal Reference Range Neisseria Gonorrhea - Negative   Final   Neisseria Gonorrhea 02/23/2022 Negative   Final   Chlamydia 02/23/2022 Negative   Final   Comment 02/23/2022 Normal Reference Ranger Chlamydia - Negative   Final   Comment 02/23/2022 Normal Reference Range Neisseria Gonorrhea - Negative   Final   Neisseria Gonorrhea 02/23/2022 Negative   Final   Chlamydia 02/23/2022 Negative   Final   Comment 02/23/2022 Normal Reference Ranger Chlamydia - Negative   Final   Comment 02/23/2022 Normal Reference Range Neisseria Gonorrhea - Negative   Final    Allergies: Patient has no active allergies.  Medications:  PTA Medications  Medication Sig   mirtazapine (REMERON) 15 MG tablet Take 15 mg by mouth at bedtime.   lithium carbonate 300 MG capsule Take 300 mg by mouth 2 (two) times daily with a meal. (Patient not taking: Reported on 07/22/2021)   hydrOXYzine (ATARAX/VISTARIL) 25 MG tablet Take 25 mg by mouth 3 (three) times  daily as needed.   lamoTRIgine (LAMICTAL) 25 MG tablet Take by mouth.   risperiDONE (RISPERDAL) 1 MG tablet Take 1.5 mg by mouth 2 (two) times daily.   BIKTARVY 50-200-25 MG TABS tablet TAKE 1 TABLET BY MOUTH 1 TIME A DAY. (Patient taking differently: 1 tablet daily.)   Facility Ordered Medications  Medication   mupirocin ointment (BACTROBAN) 2 %   acetaminophen (TYLENOL) tablet 650 mg   alum & mag hydroxide-simeth (MAALOX/MYLANTA) 200-200-20 MG/5ML suspension 30 mL   magnesium hydroxide (MILK OF MAGNESIA) suspension 30 mL   hydrOXYzine (ATARAX) tablet 25 mg    Long Term Goals: Improvement in symptoms so as ready for discharge  Short Term Goals: Patient will verbalize feelings in meetings with treatment team members., Patient will attend at least of 50% of the groups daily., Pt will complete the PHQ9 on admission, day 3 and discharge., Patient will participate in completing the [place] Suicide Severity Rating Scale, Patient will score a low risk of violence for 24 hours prior to discharge, and Patient will take medications as prescribed daily.  Medical Decision Making  MARX GERSHON was admitted to [facility] Facility base crisis unit under the service of No att. providers found for Schizoaffective disorder [facility]), crisis management, and stabilization. Routine labs ordered, which include Lab Orders         CBC with Differential/Platelet         Comprehensive metabolic panel         Hemoglobin A1c         Magnesium         Ethanol         Lipid  panel         TSH         Prolactin         Urinalysis, Routine w reflex microscopic -Urine, Clean Catch         Cd4/cd8 (t-helper/t-suppressor cell)         RPR         POCT Urine Drug Screen - (I-Screen)    Medication Management: Medications started Meds ordered this encounter  Medications   acetaminophen (TYLENOL) tablet 650 mg   alum & mag hydroxide-simeth (MAALOX/MYLANTA) 200-200-20 MG/5ML suspension 30 mL    magnesium hydroxide (MILK OF MAGNESIA) suspension 30 mL   hydrOXYzine (ATARAX) tablet 25 mg    Will maintain observation checks every 15 minutes for safety. Psychosocial education regarding relapse prevention and self-care; social and communication  Social work will consult with family for collateral information and discuss discharge and follow up plan.    Recommendations  Based on my evaluation the patient does not appear to have an emergency medical condition.  Candace Ramus, NP 09/21/22  11:44 AM

## 2022-09-22 ENCOUNTER — Ambulatory Visit: Payer: Managed Care, Other (non HMO) | Admitting: Internal Medicine

## 2022-09-22 ENCOUNTER — Encounter (HOSPITAL_COMMUNITY): Payer: Self-pay

## 2022-09-22 DIAGNOSIS — F1520 Other stimulant dependence, uncomplicated: Secondary | ICD-10-CM | POA: Diagnosis not present

## 2022-09-22 LAB — T-HELPER CELLS CD4/CD8 %
% CD 4 Pos. Lymph.: 36.1 % (ref 30.8–58.5)
Absolute CD 4 Helper: 758 /uL (ref 359–1519)
Basophils Absolute: 0 10*3/uL (ref 0.0–0.2)
Basos: 0 %
CD3+CD4+ Cells/CD3+CD8+ Cells Bld: 1.24 (ref 0.92–3.72)
CD3+CD8+ Cells # Bld: 613 /uL (ref 109–897)
CD3+CD8+ Cells NFr Bld: 29.2 % (ref 12.0–35.5)
EOS (ABSOLUTE): 0.1 10*3/uL (ref 0.0–0.4)
Eos: 1 %
Hematocrit: 39.4 % (ref 37.5–51.0)
Hemoglobin: 13.5 g/dL (ref 13.0–17.7)
Immature Grans (Abs): 0 10*3/uL (ref 0.0–0.1)
Immature Granulocytes: 0 %
Lymphocytes Absolute: 2.1 10*3/uL (ref 0.7–3.1)
Lymphs: 19 %
MCH: 30 pg (ref 26.6–33.0)
MCHC: 34.3 g/dL (ref 31.5–35.7)
MCV: 88 fL (ref 79–97)
Monocytes Absolute: 0.9 10*3/uL (ref 0.1–0.9)
Monocytes: 9 %
Neutrophils Absolute: 7.5 10*3/uL — ABNORMAL HIGH (ref 1.4–7.0)
Neutrophils: 71 %
Platelets: 252 10*3/uL (ref 150–450)
RBC: 4.5 x10E6/uL (ref 4.14–5.80)
RDW: 12.2 % (ref 11.6–15.4)
WBC: 10.7 10*3/uL (ref 3.4–10.8)

## 2022-09-22 LAB — GC/CHLAMYDIA PROBE AMP (~~LOC~~) NOT AT ARMC
Chlamydia: NEGATIVE
Comment: NEGATIVE
Comment: NORMAL
Neisseria Gonorrhea: NEGATIVE

## 2022-09-22 LAB — HIV-1 RNA QUANT-NO REFLEX-BLD
HIV 1 RNA Quant: 59800 copies/mL
LOG10 HIV-1 RNA: 4.777 log10copy/mL

## 2022-09-22 LAB — PROLACTIN: Prolactin: 9.7 ng/mL (ref 3.6–31.5)

## 2022-09-22 MED ORDER — BENZOCAINE 10 % MT GEL
Freq: Two times a day (BID) | OROMUCOSAL | Status: DC | PRN
  Filled 2022-09-22 (×2): qty 9

## 2022-09-22 MED ORDER — IBUPROFEN 200 MG PO TABS
200.0000 mg | ORAL_TABLET | Freq: Four times a day (QID) | ORAL | Status: DC | PRN
  Administered 2022-09-22 – 2022-09-25 (×4): 200 mg via ORAL
  Filled 2022-09-22 (×4): qty 1

## 2022-09-22 NOTE — ED Notes (Signed)
Patient resting quietly in bed with eyes closed. Respirations equal and unlabored, skin warm and dry, NAD. No change in assessment or acuity. Q 15 minute safety checks remain in place.   

## 2022-09-22 NOTE — Group Note (Signed)
Group Topic: Relapse and Recovery  Group Date: 09/22/2022 Start Time: 0800 End Time: 0900 Facilitators: Rae Lips B  Department: [facility]  Number of Participants: 1  Group Focus: activities of daily living skills Treatment Modality:  Individual Therapy Interventions utilized were leisure development Purpose: relapse prevention strategies  Name: Devon Hernandez Date of Birth: [birth year]  MR: [id number]    Level of Participation: Did not attend  Quality of Participation: did not attend. Interactions with others: did not attend Mood/Affect: NA Triggers (if applicable): NA Cognition: NA Progress: Other Response: NA Plan: patient will be encouraged to go to groups.   Patients Problems:  Patient Active Problem List   Diagnosis Date Noted   Bipolar affective disorder, current episode depressed (HCC) 09/21/2022   Borderline personality disorder (HCC) 09/21/2022   Methamphetamine use disorder, moderate, in controlled environment (HCC) 09/21/2022   Cannabis use disorder, severe, in controlled environment, dependence (HCC) 09/21/2022   Schizoaffective disorder (HCC) 09/21/2022   Schizoaffective disorder, depressive type (HCC) 09/21/2022   Syphilis (acquired) 02/23/2022   Depression 03/31/2021   Rectal bleeding 08/27/2020   Rectal discharge 08/27/2020   Bloating 08/27/2020   Constipation 08/25/2020   Routine screening for STI (sexually transmitted infection) 04/22/2019   Encounter for long-term (current) use of high-risk medication 04/22/2019   Major depressive disorder, recurrent episode, mild (HCC) 10/13/2018   Human immunodeficiency virus (HIV) disease (HCC) 07/23/2018   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

## 2022-09-22 NOTE — ED Notes (Signed)
Patient remains asleep in bed at this time.  NO distress noted or reported.  Will monitor and provide a safe environment.

## 2022-09-22 NOTE — Discharge Instructions (Signed)
[facility] [address] [place], [place], [address] [phone] phone  New Patient Assessment/Therapy Walk-Ins:  Monday and Wednesday: 8 am until slots are full. Every 1st and 2nd Fridays of the month: 1 pm - 5 pm.  NO ASSESSMENT/THERAPY WALK-INS ON TUESDAYS OR THURSDAYS  New Patient Assessment/Medication Management Walk-Ins:  Monday - Friday:  8 am - 11 am.  For all walk-ins, we ask that you arrive by 7:30 am because patients will be seen in the order of arrival.  Availability is limited; therefore, you may not be seen on the same day that you walk-in.  Our goal is to serve and meet the needs of our community to the best of our ability.  SUBSTANCE USE TREATMENT for Medicaid and State Funded/IPRS  [facility] [address] [place], [place], [address] [phone] phone NOTE: [facility] is no longer offering IOP services.  Serves those who are low-income or have no insurance.  [facility] [address], [place], [place], [address] [phone] phone [phone] fax NOTE: Does have Substance Abuse-Intensive Outpatient Program (SAIOP) as well as transitional housing if eligible.  [facility] [address] [place], [place], [address] [phone] phone [phone] fax  [facility] [address] [place], [place], [address] [phone] phone [phone] fax  HALFWAY HOUSES:  [facility] [phone]  [facility] www.oxfordvacancies.com  12 STEP PROGRAMS:  Alcoholics Anonymous of [place] https://aagreensboronc.com/meeting  Narcotics Anonymous of [place] https://greensborona.org/meetings/  Al-Anon of [place], [place] www.greens[place]alanon.org/find-meetings.html  Nar-Anon https://nar-anon.org/find-a-meetin  List of Residential placements:   [facility] in [place]: [phone]  [facility]: [phone]  [facility]: [place], [place]  [phone]: Male and male facility; 30-day program: (uninsured and Medicaid such as [facility], [facility], [facility], partners)  [facility]: [phone]; men and women's facility; 28 days; Can have Medicaid tailored plan ([facility] or [facility])  [facility]: [phone] Angie or [place]; 28 day program; must be fully detox; tailored Medicaid or no insurance  [facility] in [place], [place]; [phone]; 28 day all males program; no insurance accepted  [facility] in [place]: Joe [phone] (no insurance or Medicaid only); 90 days; outpatient services but provide housing in apartments [place]  [facility] Admission: [phone]: Patient must complete phone screening for placement: [place], [place]; 6 month program; uninsured, Medicaid, and [facility] insurance.   [facility]: no insurance required; [phone]  [place] [facility]: [phone]; Intake: Robert; Must fill out application online; Victor Delay [phone] x [phone]  [facility] in [place], [place]: [phone]; Admissions Coordinators Mr. Dennis or David [place]; 90 day program.  [facility]: [place], [place] [phone]; Co-Ed 9 month to a year program; Online application; Men entry fee is $500 (6-12months);  [facility]: [address] [place], [place] [address]; no fee or insurance required; minimum of 2 years; Highly structured; work based; Intake Coordinator is Chris [phone]  [facility] in [place], [place]: [phone]; Fax number is [phone]; website: www.[facility].org; Requires 3-6 page autobiography; 2 year program (18 months and then 6month transitional housing); Admission fee is $300; no insurance needed; work program  [facility] in [place], : Front Desk Staff: Reeci [phone]: They have a Men's Regenerations Program 6-9months. Free program; There is an initial $300 fee however, they are willing to work  with patients regarding that. Application is online.  [facility]: Admissions [phone] Benjamin Cox ext [phone]; Any 7-90 day program is out of pocket; 12   month program is free of charge; there is a $275 entry fee; Patient is responsible for own transportation 

## 2022-09-22 NOTE — Group Note (Signed)
Group Topic: Emotional Regulation  Group Date: 09/22/2022 Start Time: 0500 End Time: 0530 Facilitators: Bertram Denver  Department: [facility]  Number of Participants: 2  Group Focus: communication Treatment Modality:  Dialectical Behavioral Therapy Interventions utilized were exploration Purpose: express feelings  Name: Devon Hernandez Date of Birth: 08/22/1995  MR: [id number]    Level of Participation: active Quality of Participation: cooperative Interactions with others: gave feedback Mood/Affect: appropriate Triggers (if applicable): n/a Cognition: concrete Progress: Moderate Response: n/a Plan: follow-up needed  Patients Problems:  Patient Active Problem List   Diagnosis Date Noted   Bipolar affective disorder, current episode depressed (HCC) 09/21/2022   Borderline personality disorder (HCC) 09/21/2022   Methamphetamine use disorder, moderate, in controlled environment (HCC) 09/21/2022   Cannabis use disorder, severe, in controlled environment, dependence (HCC) 09/21/2022   Schizoaffective disorder (HCC) 09/21/2022   Schizoaffective disorder, depressive type (HCC) 09/21/2022   Syphilis (acquired) 02/23/2022   Depression 03/31/2021   Rectal bleeding 08/27/2020   Rectal discharge 08/27/2020   Bloating 08/27/2020   Constipation 08/25/2020   Routine screening for STI (sexually transmitted infection) 04/22/2019   Encounter for long-term (current) use of high-risk medication 04/22/2019   Major depressive disorder, recurrent episode, mild (HCC) 10/13/2018   Human immunodeficiency virus (HIV) disease (HCC) 07/23/2018   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

## 2022-09-22 NOTE — ED Notes (Signed)
Patient in room asleep. Breathing even and unlabored. Patient in no current distress.

## 2022-09-22 NOTE — BH IP Treatment Plan (Signed)
Interdisciplinary Treatment and Diagnostic Plan Update  09/21/2022 Time of Session: 2:00PM Devon Hernandez MRN: [id number]  Diagnosis:  Final diagnoses:  None     Current Medications:  Current Facility-Administered Medications  Medication Dose Route Frequency Provider Last Rate Last Admin   acetaminophen (TYLENOL) tablet 650 mg  650 mg Oral Q6H PRN Rankin, Shuvon B, NP   650 mg at 09/21/22 2228   alum & mag hydroxide-simeth (MAALOX/MYLANTA) 200-200-20 MG/5ML suspension 30 mL  30 mL Oral Q4H PRN Rankin, Shuvon B, NP       bictegravir-emtricitabine-tenofovir AF (BIKTARVY) 50-200-25 MG per tablet 1 tablet  1 tablet Oral Daily Rankin, Shuvon B, NP   1 tablet at 09/21/22 1423   hydrOXYzine (ATARAX) tablet 25 mg  25 mg Oral TID PRN Rankin, Shuvon B, NP   25 mg at 09/21/22 2228   magnesium hydroxide (MILK OF MAGNESIA) suspension 30 mL  30 mL Oral Daily PRN Rankin, Shuvon B, NP       mupirocin ointment (BACTROBAN) 2 %   Topical BID Georgina Quint, MD       traZODone (DESYREL) tablet 50 mg  50 mg Oral QHS PRN Rankin, Shuvon B, NP       Current Outpatient Medications  Medication Sig Dispense Refill   acetaminophen (TYLENOL) 500 MG tablet Take 1,000 mg by mouth every 6 (six) hours as needed (For tooth pain).     Aspirin-Acetaminophen-Caffeine (GOODYS EXTRA STRENGTH) 520-260-32.5 MG PACK Take 2 packets by mouth every 6 (six) hours as needed (For tooth pain).     BIKTARVY 50-200-25 MG TABS tablet TAKE 1 TABLET BY MOUTH 1 TIME A DAY. (Patient taking differently: 1 tablet daily.) 30 tablet 0   neomycin-bacitracin-polymyxin (NEOSPORIN) [phone] ointment Apply 1 Application topically 4 (four) times daily as needed (Apply to affected area).     PTA Medications: Prior to Admission medications   Medication Sig Start Date End Date Taking? Authorizing Provider  acetaminophen (TYLENOL) 500 MG tablet Take 1,000 mg by mouth every 6 (six) hours as needed (For tooth pain).   Yes [provider]   Aspirin-Acetaminophen-Caffeine (GOODYS EXTRA STRENGTH) 520-260-32.5 MG PACK Take 2 packets by mouth every 6 (six) hours as needed (For tooth pain).   Yes [provider]  BIKTARVY 50-200-25 MG TABS tablet TAKE 1 TABLET BY MOUTH 1 TIME A DAY. Patient taking differently: 1 tablet daily. 11/05/21  Yes Comer, Belia Heman, MD  neomycin-bacitracin-polymyxin (NEOSPORIN) [phone] ointment Apply 1 Application topically 4 (four) times daily as needed (Apply to affected area).   Yes [provider]    Patient Stressors: Legal issue   Medication change or noncompliance   Substance abuse    Patient Strengths: Ability for insight  Average or above average intelligence  Capable of independent living  Communication skills  General fund of knowledge  Motivation for treatment/growth  Supportive family/friends   Treatment Modalities: Medication Management, Group therapy, Case management,  1 to 1 session with clinician, Psychoeducation, Recreational therapy.   Physician Treatment Plan for Primary and Secondary Diagnosis:  Final diagnoses:  None   Long Term Goal(s): Improvement in symptoms so as ready for discharge  Short Term Goals: Patient will verbalize feelings in meetings with treatment team members. Patient will attend at least of 50% of the groups daily. Pt will complete the PHQ9 on admission, day 3 and discharge. Patient will participate in completing the [place] Suicide Severity Rating Scale Patient will score a low risk of violence for 24 hours prior to  discharge Patient will take medications as prescribed daily.  Medication Management: Evaluate patient's response, side effects, and tolerance of medication regimen.  Therapeutic Interventions: 1 to 1 sessions, Unit Group sessions and Medication administration.  Evaluation of Outcomes: Progressing  LCSW Treatment Plan for Primary Diagnosis:  Final diagnoses:  None    Long Term Goal(s): Safe transition to appropriate  next level of care at discharge.  Short Term Goals: Facilitate acceptance of mental health diagnosis and concerns through verbal commitment to aftercare plan and appointments at discharge., Patient will identify one social support prior to discharge to aid in patient's recovery., Patient will attend AA/NA groups as scheduled., Identify minimum of 2 triggers associated with mental health/substance abuse issues with treatment team members., and Increase skills for wellness and recovery by attending 50% of scheduled groups.  Therapeutic Interventions: Assess for all discharge needs, 1 to 1 time with Child psychotherapist, Explore available resources and support systems, Assess for adequacy in community support network, Educate family and significant other(s) on suicide prevention, Complete Psychosocial Assessment, Interpersonal group therapy.  Evaluation of Outcomes: Progressing   Progress in Treatment: Attending groups: Yes. Participating in groups: Yes. Taking medication as prescribed: Yes. Toleration medication: Yes. Family/Significant other contact made: No, will contact:  Patient's mother: [name] [phone] for collateral.  Patient understands diagnosis: Yes. Discussing patient identified problems/goals with staff: Yes. Medical problems stabilized or resolved: Yes. Denies suicidal/homicidal ideation: Yes. Issues/concerns per patient self-inventory: Yes. Other: legal charges and substance use  New problem(s) identified: No, Describe:  other than reported on admission  New Short Term/Long Term Goal(s): Safe transition to appropriate next level of care at discharge, Engage patient in therapeutic group addressing interpersonal concerns. Engage patient in aftercare planning with referrals and resources, Increase ability to appropriately verbalize feelings, Facilitate acceptance of mental health diagnosis and concerns and Identify triggers associated with mental health/substance abuse issues.    Patient Goals:  Patient reports his current goal is to for on detox for 3-5 days. Patient reports his plan is to discharge back home once stable with outpatient follow up in place. Patient reports he has a court date on October 03, 2022 and reports he is unsure of what will come about from it. Patient reports if he is releases then he will consider residential placement after that.   Discharge Plan or Barriers: Patient will focus on detox at this time. Patient has been encouraged to participate in programming on unit each day. Patient also will be provided outpatient resources for follow up with an expectation to follow through within 7-10days of discharge.   Reason for Continuation of Hospitalization: Medication stabilization Withdrawal symptoms  Estimated Length of Stay: 3-5 days  Last 3 [place] Suicide Severity Risk Score: Flowsheet Row ED from 09/21/2022 in [facility] Most recent reading at 09/21/2022  2:42 PM ED from 09/21/2022 in [facility] Most recent reading at 09/21/2022  9:22 AM ED from 09/20/2022 in [facility] Most recent reading at 09/20/2022  6:34 PM  C-SSRS RISK CATEGORY No Risk No Risk No Risk       Last PHQ 2/9 Scores:    09/21/2022    3:40 PM 02/23/2022   11:32 AM 03/31/2021   10:41 AM  Depression screen PHQ 2/9  Decreased Interest 1 0 0  Down, Depressed, Hopeless 1 0 3  PHQ - 2 Score 2 0 3  Altered sleeping 3  3  Tired, decreased energy 3  3  Change  in appetite 3  3  Feeling bad or failure about yourself  3  3  Trouble concentrating 3  3  Moving slowly or fidgety/restless 1  3  Suicidal thoughts 0  0  PHQ-9 Score 18  21    Scribe for Treatment Team: Lenny Pastel 09/22/2022 8:25 AM

## 2022-09-22 NOTE — Group Note (Signed)
Group Topic: Overcoming Obstacles  Group Date: 09/22/2022 Start Time: 1230 End Time: 1300 Facilitators: Jenean Lindau, RN  Department: [facility]  Number of Participants: 2  Group Focus: affirmation, anxiety, coping skills, and depression Treatment Modality:  Psychoeducation Interventions utilized were patient education and problem solving Purpose: enhance coping skills, explore maladaptive thinking, express feelings, express irrational fears, increase insight, regain self-worth, and reinforce self-care  Name: Devon Hernandez Date of Birth: 1995-05-20  MR: [id number]    Level of Participation: minimal Quality of Participation: cooperative Interactions with others: gave feedback Mood/Affect: appropriate Triggers (if applicable):   Cognition: logical Progress: Gaining insight Response:   Plan: follow-up needed  Patients Problems:  Patient Active Problem List   Diagnosis Date Noted   Bipolar affective disorder, current episode depressed (HCC) 09/21/2022   Borderline personality disorder (HCC) 09/21/2022   Methamphetamine use disorder, moderate, in controlled environment (HCC) 09/21/2022   Cannabis use disorder, severe, in controlled environment, dependence (HCC) 09/21/2022   Schizoaffective disorder (HCC) 09/21/2022   Schizoaffective disorder, depressive type (HCC) 09/21/2022   Syphilis (acquired) 02/23/2022   Depression 03/31/2021   Rectal bleeding 08/27/2020   Rectal discharge 08/27/2020   Bloating 08/27/2020   Constipation 08/25/2020   Routine screening for STI (sexually transmitted infection) 04/22/2019   Encounter for long-term (current) use of high-risk medication 04/22/2019   Major depressive disorder, recurrent episode, mild (HCC) 10/13/2018   Human immunodeficiency virus (HIV) disease (HCC) 07/23/2018   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

## 2022-09-22 NOTE — Progress Notes (Signed)
During assessment, patient presents with flat affect, A&Ox4, cooperative, and verbalizing some anxiety. Patient stated he has slept "on and off" but has a hard time getting good rest. Patient awake and ate a meal. Patient received hydroxyzine and trazodone to help relief some anxiety and promote good sleep. Patient also received ibuprofen for jaw pain 6 out of 10. Patient verbalized no further concerns and went back to rest. No s/s of current distress.

## 2022-09-22 NOTE — Group Note (Signed)
Group Topic: Decisional Balance/Substance Abuse  Group Date: 09/22/2022 Start Time: 0130 End Time: 0200 Facilitators: Lenny Pastel  Department: [facility]  Number of Participants: 2  Group Focus: activities of daily living skills, chemical dependency issues, clarity of thought, communication, coping skills, daily focus, feeling awareness/expression, goals/reality orientation, impulsivity, personal responsibility, problem solving, self-awareness, self-esteem, and social skills Treatment Modality:  Cognitive Behavioral Therapy and Psychoeducation Interventions utilized were assignment, exploration, mental fitness, problem solving, and support Purpose: enhance coping skills, explore maladaptive thinking, express feelings, express irrational fears, improve communication skills, increase insight, regain self-worth, reinforce self-care, relapse prevention strategies, and trigger / craving management  Name: Devon Hernandez Date of Birth: May 30, 1995  MR: [id number]    Level of Participation: active Quality of Participation: attentive and cooperative Interactions with others: gave feedback Mood/Affect: appropriate Triggers (if applicable): Impulsivity  Cognition: coherent/clear, insightful, and logical Progress: Gaining insight Response: Patient actively participated in group on today. Group started off with introductions and group rules. Group members participated in a therapeutic activity that required active listening and communication skills. Group members were able to identify similarities and differences within the group. Patient interacted positively with staff and peers. No issues to report.  Plan: referral / recommendations  Patients Problems:  Patient Active Problem List   Diagnosis Date Noted   Bipolar affective disorder, current episode depressed (HCC) 09/21/2022   Borderline personality disorder (HCC) 09/21/2022   Methamphetamine use disorder,  moderate, in controlled environment (HCC) 09/21/2022   Cannabis use disorder, severe, in controlled environment, dependence (HCC) 09/21/2022   Schizoaffective disorder (HCC) 09/21/2022   Schizoaffective disorder, depressive type (HCC) 09/21/2022   Syphilis (acquired) 02/23/2022   Depression 03/31/2021   Rectal bleeding 08/27/2020   Rectal discharge 08/27/2020   Bloating 08/27/2020   Constipation 08/25/2020   Routine screening for STI (sexually transmitted infection) 04/22/2019   Encounter for long-term (current) use of high-risk medication 04/22/2019   Major depressive disorder, recurrent episode, mild (HCC) 10/13/2018   Human immunodeficiency virus (HIV) disease (HCC) 07/23/2018   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

## 2022-09-23 DIAGNOSIS — F1520 Other stimulant dependence, uncomplicated: Secondary | ICD-10-CM | POA: Diagnosis not present

## 2022-09-23 MED ORDER — MIRTAZAPINE 15 MG PO TBDP
15.0000 mg | ORAL_TABLET | Freq: Every day | ORAL | Status: DC
  Administered 2022-09-24 – 2022-09-26 (×3): 15 mg via ORAL
  Filled 2022-09-23 (×3): qty 1

## 2022-09-23 MED ORDER — PALIPERIDONE PALMITATE ER 156 MG/ML IM SUSY
156.0000 mg | PREFILLED_SYRINGE | Freq: Once | INTRAMUSCULAR | Status: AC
  Administered 2022-09-25: 156 mg via INTRAMUSCULAR

## 2022-09-23 NOTE — ED Notes (Signed)
Pt is in the bed sleeping. Respirations are even and unlabored. No acute distress noted. Will continue to monitor for safety. 

## 2022-09-23 NOTE — ED Notes (Signed)
Patient calmly sleeping in room. Breathing even and unlabored. No s/s of current distress.

## 2022-09-23 NOTE — ED Notes (Signed)
Patient  sleeping in no acute stress. RR even and unlabored .Environment secured .Will continue to monitor for safely. 

## 2022-09-23 NOTE — ED Provider Notes (Addendum)
[facility] Progress Note  Date and Time: 09/23/2022 10:08 AM Name: Devon Hernandez MRN:  [id number]  Reason For Admission: Devon Hernandez is a 27 yo male w/ hx of PTSD, Schzoaffective Disorder, ODD, ADHD, substance induced psychosis, borderline personality disorder, and extensive substance abuse including most recently methamphetamine, nicotine, and THC admitted to [facility] for substance use detox and psychiatric stabilization.  Subjective:  Patient seen and assessed at bedside.  Reports mild discomfort with sleep last night as well as arm pain secondary to [name] injection.  He denies SI/HI/AVH.  He reports that he feels that he is improving day by day.  His arm pain has also been improving.  He reports continuing to be interested in restarting mirtazapine which will be restarted tonight.  Patient reports still being interested in outpatient follow-up with outpatient resources.  No further questions at this time and no acute complaints.  He denies any acute withdrawal at this time.  Discussed the importance of compliance with his HIV medications as his HIV RNA quant was elevated.  He verbalized understanding agreement with plan.  Diagnosis:  Final diagnoses:  Methamphetamine use disorder, moderate, in controlled environment (HCC) [F15.20]  Other depression  Human immunodeficiency virus (HIV) disease (HCC)    Total Time spent with patient: 45 minutes  Past Psychiatric Hx: Previous Psych Diagnoses: PTSD, ODD, ADHD, methamphetamine induced psychosis, borderline personality disorder Prior inpatient treatment: reports not for several years Current/prior outpatient treatment:none Psychotherapy [id number] History of suicide:denies History of homicide: denies Psychiatric medication history:multiple antipsychotics and antidepressants in the past Psychiatric medication compliance history:poor, self-discontinued for past year Neuromodulation history: denies Current Psychiatrist:none Current therapist:  non  Family History:  Family History  Problem Relation Age of Onset   Depression Mother    Hypertension Mother    Depression Father        Hx: PTSD   Diabetes Maternal Aunt    AVM Maternal Grandmother    Aneurysm Maternal Grandmother    Seizures Maternal Grandmother    Stomach cancer Neg Hx    Pancreatic cancer Neg Hx    Colon cancer Neg Hx    Liver disease Neg Hx    Esophageal cancer Neg Hx    Rectal cancer Neg Hx     Labs  Lab Results:     Latest Ref Rng & Units 09/21/2022    5:26 PM 09/21/2022   12:23 PM 12/15/2021   10:30 AM  CBC  WBC 3.4 - 10.8 x10E3/uL 10.7  9.3  10.4   Hemoglobin 13.0 - 17.7 g/dL 78.2  95.6  21.3   Hematocrit 37.5 - 51.0 % 39.4  41.0  39.5   Platelets 150 - 450 x10E3/uL 252  275  278       Latest Ref Rng & Units 09/21/2022   12:23 PM 12/15/2021   10:30 AM 07/29/2020    2:00 PM  CMP  Glucose 70 - 99 mg/dL 96  086  97   BUN 6 - 20 mg/dL 13  21  12    Creatinine 0.61 - 1.24 mg/dL 5.78  4.69  6.29   Sodium 135 - 145 mmol/L 137  141  140   Potassium 3.5 - 5.1 mmol/L 3.7  3.7  4.1   Chloride 98 - 111 mmol/L 102  106  104   CO2 22 - 32 mmol/L 24  27  27    Calcium 8.9 - 10.3 mg/dL 8.7  9.6  52.8   Total Protein 6.5 - 8.1 g/dL 6.9  7.4  7.1   Total Bilirubin 0.3 - 1.2 mg/dL 0.8  0.7  1.0   Alkaline Phos 38 - 126 U/L 70     AST 15 - 41 U/L 23  21  18    ALT 0 - 44 U/L 19  11  9      Physical Findings   AUDIT    Flowsheet Row [facility] Ad[facility] (Dis[facility]  Alcohol Use Disorder Identification Test Final Score (AUDIT) 0 0      PHQ2-9    Flowsheet Row ED from 09/21/2022 in [facility] Office Visit from 02/23/2022 in [facility] for Infectious Disease Office Visit from 03/31/2021 in [facility] for Infectious Disease Office Visit from 08/25/2020 in  [facility] for Infectious Disease Office Visit from 02/12/2020 in [facility] for Infectious Disease  PHQ-2 Total Score 2 0 3 0 6  PHQ-9 Total Score 18 -- 21 -- 21      Flowsheet Row ED from 09/21/2022 in [facility] Most recent reading at 09/21/2022  2:42 PM ED from 09/21/2022 in [facility] Most recent reading at 09/21/2022  9:22 AM ED from 09/20/2022 in [facility] Most recent reading at 09/20/2022  6:34 PM  C-SSRS RISK CATEGORY No Risk No Risk No Risk        Musculoskeletal  Strength & Muscle Tone: within normal limits Gait & Station: normal Patient leans: N/A  Psychiatric Specialty Exam  Presentation  General Appearance:  Appropriate for Environment; Casual   Eye Contact: Good   Speech: Clear and Coherent; Normal Rate   Speech Volume: Normal   Handedness: Right    Mood and Affect  Mood: Anxious   Affect: Congruent    Thought Process  Thought Processes: Coherent; Goal Directed; Linear   Descriptions of Associations:Intact   Orientation:Full (Time, Place and Person)   Thought Content:Logical   Diagnosis of Schizophrenia or Schizoaffective disorder in past: Yes     Hallucinations:Hallucinations: None    Ideas of Reference:None   Suicidal Thoughts:Suicidal Thoughts: No    Homicidal Thoughts:Homicidal Thoughts: No     Sensorium  Memory: Remote Good   Judgment: Good   Insight: Good    Executive Functions  Concentration: Good   Attention Span: Good   Recall: Good   Fund of Knowledge: Good   Language: Good    Psychomotor Activity  Psychomotor Activity: Psychomotor Activity: Normal     Assets  Assets: Communication Skills; Resilience; Desire for Improvement    Sleep  Sleep: Sleep: Poor     Physical Exam  Physical Exam ROS Blood pressure 125/72, pulse 75, temperature  98 F (36.7 C), temperature source Tympanic, resp. rate 18, SpO2 98 %. There is no height or weight on file to calculate BMI.  ASSESSMENT Devon Hernandez is a 27 yo male w/ hx of PTSD, ODD, ADHD, substance induced psychosis, borderline personality disorder, and extensive substance abuse including most recently methamphetamine, nicotine, and THC admitted to [facility] for substance use detox and psychiatric stabilization.  PLAN Schizoaffective disorder, depressive type PTSD Methamphetamine use disorder Substance-induced psychosis Cannabis use disorder Tobacco use disorder -Received Hinda Glatter Sustenna 256 IM on 09/21/2022 -Should receive second loading dose on 09/24/2022 -Hydroxyzine/trazodone as needed for anxiety/insomnia -Start mirtazapine 15 mg nightly for depression  HIV HIV RNA quant 59,800 secondary  to medication noncomplaince -continue biktarvy  Dispo home Tuesday 09/27/22   Park Pope, MD 09/23/2022 10:08 AM

## 2022-09-23 NOTE — Group Note (Signed)
Group Topic: Communication  Group Date: 09/23/2022 Start Time: 1015 End Time: 1030 Facilitators: Merrie Roof, RN  Department: [facility]  Number of Participants: 2  Group Focus: safety plan Treatment Modality:  Individual Therapy Interventions utilized were assignment Purpose: enhance coping skills  Name: Devon Hernandez Date of Birth: [birth year]  MR: [id number]    Level of Participation: active Quality of Participation: attentive Interactions with others: gave feedback Mood/Affect: appropriate Triggers (if applicable):  Cognition: coherent/clear Progress: Significant Response:  Plan: patient will be encouraged to continue with therapy.  Patients Problems:  Patient Active Problem List   Diagnosis Date Noted   Bipolar affective disorder, current episode depressed (HCC) 09/21/2022   Borderline personality disorder (HCC) 09/21/2022   Methamphetamine use disorder, moderate, in controlled environment (HCC) 09/21/2022   Cannabis use disorder, severe, in controlled environment, dependence (HCC) 09/21/2022   Schizoaffective disorder (HCC) 09/21/2022   Schizoaffective disorder, depressive type (HCC) 09/21/2022   Syphilis (acquired) 02/23/2022   Depression 03/31/2021   Rectal bleeding 08/27/2020   Rectal discharge 08/27/2020   Bloating 08/27/2020   Constipation 08/25/2020   Routine screening for STI (sexually transmitted infection) 04/22/2019   Encounter for long-term (current) use of high-risk medication 04/22/2019   Major depressive disorder, recurrent episode, mild (HCC) 10/13/2018   Human immunodeficiency virus (HIV) disease (HCC) 07/23/2018   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

## 2022-09-23 NOTE — ED Notes (Signed)
Patient alert and oriented x 3. Denies SI/HI/AVH. Denies intent or plan to harm self or others. Routine conducted according to faculty protocol. Encourage patient to notify staff with any needs or concerns. Patient verbalized agreement and understanding. Will continue to monitor for safety. 

## 2022-09-23 NOTE — ED Notes (Signed)
Meal has been offered to pt. However, pt is still asleep. No meal given at this time. Will continue to monitor for safety.  

## 2022-09-23 NOTE — Group Note (Signed)
Group Topic: Spirituality in Recovery  Group Date: 09/23/2022 Start Time: 0200 End Time: 0245 Facilitators: Lenny Pastel  Department: [facility]  Number of Participants: 3  Group Focus: clarity of thought, daily focus, feeling awareness/expression, healthy friendships, personal responsibility, and self-awareness Treatment Modality:  Cognitive Behavioral Therapy, Interpersonal Therapy, and Spiritual Interventions utilized were story telling and support Purpose: explore maladaptive thinking, express feelings, increase insight, regain self-worth, and reinforce self-care  Name: Devon Hernandez Date of Birth: 03-26-1996  MR: [id number]    Level of Participation: active Quality of Participation: attentive and cooperative Interactions with others: gave feedback Mood/Affect: appropriate Triggers (if applicable): N/A Cognition: coherent/clear Progress: Gaining insight Response: Patient participated in group on today. Patient reports that the video clip definitely made him reflect on the importance of focusing on the journey an not the destination because it could throw you off track and cause you to feel discouraged.  Patient reports feeling encouraged by the video.   Plan: referral / recommendations  Patients Problems:  Patient Active Problem List   Diagnosis Date Noted   Bipolar affective disorder, current episode depressed (HCC) 09/21/2022   Borderline personality disorder (HCC) 09/21/2022   Methamphetamine use disorder, moderate, in controlled environment (HCC) 09/21/2022   Cannabis use disorder, severe, in controlled environment, dependence (HCC) 09/21/2022   Schizoaffective disorder (HCC) 09/21/2022   Schizoaffective disorder, depressive type (HCC) 09/21/2022   Syphilis (acquired) 02/23/2022   Depression 03/31/2021   Rectal bleeding 08/27/2020   Rectal discharge 08/27/2020   Bloating 08/27/2020   Constipation 08/25/2020   Routine screening for  STI (sexually transmitted infection) 04/22/2019   Encounter for long-term (current) use of high-risk medication 04/22/2019   Major depressive disorder, recurrent episode, mild (HCC) 10/13/2018   Human immunodeficiency virus (HIV) disease (HCC) 07/23/2018   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

## 2022-09-23 NOTE — Discharge Planning (Signed)
LCSW spoke with patient on this morning to confirm permission to speak with his mother and patient confirmed.   LCSW contacted the patient's mother Devon Hernandez [phone] to gain collateral. Per Mother, patient lives at home with her and his stepfather. Mother reports she just found out last week that the patient has a substance abuse issue. Mother reports she did feel like the patient was heading down a path of destruction due to the trouble he was getting in and reports of not taking his mental health meds. Mother reports the patient was supposed to be receiving services with [facility] for therapy and medication management, however reports she followed up with them and he had not been to their facility in over three months. Mother reports she found out that the patient was shoplifting in [facility] and trying to sell the items to people for drugs. Mom reports being informed that the patient was also meeting strangers online to get the substances. Mother reports this is concerning to her and reports the patient lacks insight on how dangerous this could be for himself as well as the family. Mother reports due to the patient's legal issues, the patient has to see a [employer] Support Specialist every Wednesday between the hours of 9am-3pm. Mother reports the courts have recommended that he detox for 7 days after being [facility] jail. Mother reports she was terrified of the patient being there so she went and got him out. Mother reports she and the patient has been communicating since he has been in the [facility]. Mother reports their plan is for the patient to follow up outpatient until plan has been established after court date. Pending court decision, if appropriate then they will look into residential placement for the patient. Mother reports she has been calling around to facilities, however has been informed that the patient will likely need dual diagnosis placement. Mother aware that LCSW  can provide a list of the residential facilities in network with their insurance for their follow up once discharged. Mother also reports an interest in being connected with a different outpatient provider other than [facility]. Mother aware that LCSW will follow up with the [facility] regarding availability and need for aftercare follow up. Mother expressed appreciation for LCSW assistance. Mother asked if patient was able to be held until at least Tuesday. Mother aware that LCSW will follow up with MD to verify and will follow up to provide an update. No other needs were reported at this time.   LCSW spoke with MD who reports patient will likely be stable for discharge prior to Tuesday. MD understands court order and will work with patient and family regarding plan. LCSW contacted the [facility] and was able to secure an intake appointment with Mr. [facility] at 10:00am on 09/29/2022 for IOP services. Mother provided an update and no other needs were reported.   Devon Boyden, LCSW Clinical Social Worker [facility] Ph: [phone]

## 2022-09-23 NOTE — ED Notes (Signed)
Patient  resting in no acute stress. RR even and unlabored .Environment secured .Will continue to monitor for safely. 

## 2022-09-24 DIAGNOSIS — F1520 Other stimulant dependence, uncomplicated: Secondary | ICD-10-CM | POA: Diagnosis not present

## 2022-09-24 NOTE — ED Notes (Signed)
Patient was provided with dinner 

## 2022-09-24 NOTE — ED Notes (Signed)
Pt resting in room. A/O, pleasant but guarded. Denies SI/HI/AVH. No noted distress Will continue to monitor for safety

## 2022-09-24 NOTE — ED Notes (Signed)
Pt still having back pain. Pt expressed it is from the bed.

## 2022-09-24 NOTE — ED Notes (Signed)
Pt observed/assessed in room sleeping. RR even and unlabored, appearing in no noted distress. Environmental check complete, will continue to monitor for safety 

## 2022-09-24 NOTE — ED Provider Notes (Addendum)
Behavioral Health Progress Note  Date and Time: 09/24/2022 2:56 PM Name: Devon Hernandez MRN:  [id number]  Subjective:   Devon Hernandez is a 27 yr old male who presented on 5/22 to [facility] for detox from Meth.  PPHx is significant for Paranoid Schizophrenia, PTSD, ODD, Schizoaffective disorder, ADHD, borderline personality disorder, and Meth Abuse, and 4 Suicide Attempts and 3 Psychiatric Hospitalizations (last age 81).  PMHx is significant for HIV.  He reports that he is doing okay today.  He reports no withdrawal symptoms.  He reports no cravings.  He reports his sleep was not much improved last night with the starting of Remeron.  He does report having some lower back pain last night which he reports he had last time he took Remeron and attributes it to that.  Discussed with him that he will receive his second [name] injection tomorrow and he was agreeable with this.  He reports no SI, HI, or AVH.  He reports his sleep is poor.  He reports appetite is good.  He reports no other concerns at present.  Diagnosis:  Final diagnoses:  Methamphetamine use disorder, moderate, in controlled environment (HCC) [F15.20]  Other depression  Human immunodeficiency virus (HIV) disease (HCC)    Total Time spent with patient: 30 minutes  Past Psychiatric History: Paranoid Schizophrenia, PTSD, ODD, Schizoaffective disorder, ADHD, borderline personality disorder, and Meth Abuse, and 4 Suicide Attempts and 3 Psychiatric Hospitalizations (last age 89). Past Medical History: HIV. Family History: Maternal Grandmother- Seizures, Aneurysm, Maternal Aunt- Diabetes, Mother- HTN Family Psychiatric  History: Mother- Depression, Father- Depression and PTSD Social History: has to return to court on 10/03/22 for larceny charges  Additional Social History:                         Sleep: Poor  Appetite:  Good  Current Medications:  Current Facility-Administered Medications  Medication Dose Route Frequency  Provider Last Rate Last Admin   acetaminophen (TYLENOL) tablet 650 mg  650 mg Oral Q6H PRN Rankin, Shuvon B, NP   650 mg at 09/21/22 2228   alum & mag hydroxide-simeth (MAALOX/MYLANTA) 200-200-20 MG/5ML suspension 30 mL  30 mL Oral Q4H PRN Rankin, Shuvon B, NP       benzocaine (ORAJEL) 10 % mucosal gel   Mouth/Throat BID PRN Park Pope, MD   Given at 09/22/22 1513   bictegravir-emtricitabine-tenofovir AF (BIKTARVY) 50-200-25 MG per tablet 1 tablet  1 tablet Oral Daily Rankin, Shuvon B, NP   1 tablet at 09/24/22 0923   hydrOXYzine (ATARAX) tablet 25 mg  25 mg Oral TID PRN Rankin, Shuvon B, NP   25 mg at 09/24/22 0316   ibuprofen (ADVIL) tablet 200 mg  200 mg Oral Q6H PRN Park Pope, MD   200 mg at 09/24/22 0316   magnesium hydroxide (MILK OF MAGNESIA) suspension 30 mL  30 mL Oral Daily PRN Rankin, Shuvon B, NP       mirtazapine (REMERON SOL-TAB) disintegrating tablet 15 mg  15 mg Oral QHS Park Pope, MD       mupirocin ointment (BACTROBAN) 2 %   Topical BID Georgina Quint, MD       Melene Muller ON 09/25/2022] paliperidone (INVEGA SUSTENNA) injection 156 mg  156 mg Intramuscular Once Park Pope, MD       traZODone (DESYREL) tablet 50 mg  50 mg Oral QHS PRN Rankin, Shuvon B, NP   50 mg at 09/22/22 2156   Current Outpatient  Medications  Medication Sig Dispense Refill   acetaminophen (TYLENOL) 500 MG tablet Take 1,000 mg by mouth every 6 (six) hours as needed (For tooth pain).     Aspirin-Acetaminophen-Caffeine (GOODYS EXTRA STRENGTH) 520-260-32.5 MG PACK Take 2 packets by mouth every 6 (six) hours as needed (For tooth pain).     BIKTARVY 50-200-25 MG TABS tablet TAKE 1 TABLET BY MOUTH 1 TIME A DAY. (Patient taking differently: 1 tablet daily.) 30 tablet 0   neomycin-bacitracin-polymyxin (NEOSPORIN) [phone] ointment Apply 1 Application topically 4 (four) times daily as needed (Apply to affected area).      Labs  Lab Results:  Admission on 09/21/2022  Component Date Value Ref Range Status   HIV  1 RNA Quant 09/21/2022 59,800  copies/mL Corrected   Comment: (NOTE) The reportable range for this assay is 20 to 10,000,000 copies HIV-1 RNA/mL.    LOG10 HIV-1 RNA 09/21/2022 4.777  log10copy/mL Final   Comment: (NOTE) Performed At: [facility] [address] [place], [place] [id number] Jolene Schimke MD WU:[id number]    WBC 09/21/2022 9.3  4.0 - 10.5 K/uL Final   RBC 09/21/2022 4.63  4.22 - 5.81 MIL/uL Final   Hemoglobin 09/21/2022 13.6  13.0 - 17.0 g/dL Final   HCT 95/62/1308 41.0  39.0 - 52.0 % Final   MCV 09/21/2022 88.6  80.0 - 100.0 fL Final   MCH 09/21/2022 29.4  26.0 - 34.0 pg Final   MCHC 09/21/2022 33.2  30.0 - 36.0 g/dL Final   RDW 65/78/4696 12.8  11.5 - 15.5 % Final   Platelets 09/21/2022 275  150 - 400 K/uL Final   nRBC 09/21/2022 0.0  0.0 - 0.2 % Final   Neutrophils Relative % 09/21/2022 61  % Final   Neutro Abs 09/21/2022 5.6  1.7 - 7.7 K/uL Final   Lymphocytes Relative 09/21/2022 28  % Final   Lymphs Abs 09/21/2022 2.6  0.7 - 4.0 K/uL Final   Monocytes Relative 09/21/2022 10  % Final   Monocytes Absolute 09/21/2022 0.9  0.1 - 1.0 K/uL Final   Eosinophils Relative 09/21/2022 1  % Final   Eosinophils Absolute 09/21/2022 0.1  0.0 - 0.5 K/uL Final   Basophils Relative 09/21/2022 0  % Final   Basophils Absolute 09/21/2022 0.0  0.0 - 0.1 K/uL Final   Immature Granulocytes 09/21/2022 0  % Final   Abs Immature Granulocytes 09/21/2022 0.02  0.00 - 0.07 K/uL Final   Performed at [facility], [address], [place], [place] [address]   Sodium 09/21/2022 137  135 - 145 mmol/L Final   Potassium 09/21/2022 3.7  3.5 - 5.1 mmol/L Final   Chloride 09/21/2022 102  98 - 111 mmol/L Final   CO2 09/21/2022 24  22 - 32 mmol/L Final   Glucose, Bld 09/21/2022 96  70 - 99 mg/dL Final   Glucose reference range applies only to samples taken after fasting for at least 8 hours.   BUN 09/21/2022 13  6 - 20 mg/dL Final   Creatinine, Ser 09/21/2022 0.85  0.61 - 1.24 mg/dL  Final   Calcium 41/32/4401 8.7 (L)  8.9 - 10.3 mg/dL Final   Total Protein 02/72/5366 6.9  6.5 - 8.1 g/dL Final   Albumin 44/06/4740 3.9  3.5 - 5.0 g/dL Final   AST 59/56/3875 23  15 - 41 U/L Final   ALT 09/21/2022 19  0 - 44 U/L Final   Alkaline Phosphatase 09/21/2022 70  38 - 126 U/L Final  Total Bilirubin 09/21/2022 0.8  0.3 - 1.2 mg/dL Final   GFR, Estimated 09/21/2022 >60  >60 mL/min Final   Comment: (NOTE) Calculated using the CKD-EPI Creatinine Equation (2021)    Anion gap 09/21/2022 11  5 - 15 Final   Performed at [facility], [address] [place], [place] [address]   Hgb A1c MFr Bld 09/21/2022 5.5  4.8 - 5.6 % Final   Comment: (NOTE) Pre diabetes:          5.7%-6.4%  Diabetes:              >6.4%  Glycemic control for   <7.0% adults with diabetes    Mean Plasma Glucose 09/21/2022 111.15  mg/dL Final   Performed at [facility], [address], [place], [place] [address]   Magnesium 09/21/2022 2.2  1.7 - 2.4 mg/dL Final   Performed at [facility], [address] [place], [place] [address]   Alcohol, Ethyl (B) 09/21/2022 <10  <10 mg/dL Final   Comment: (NOTE) Lowest detectable limit for serum alcohol is 10 mg/dL.  For medical purposes only. Performed at [facility], [address], [place], [place] [address]    Cholesterol 09/21/2022 124  0 - 200 mg/dL Final   Triglycerides 29/56/2130 84  <150 mg/dL Final   HDL 86/57/8469 39 (L)  >40 mg/dL Final   Total CHOL/HDL Ratio 09/21/2022 3.2  RATIO Final   VLDL 09/21/2022 17  0 - 40 mg/dL Final   LDL Cholesterol 09/21/2022 68  0 - 99 mg/dL Final   Comment:        Total Cholesterol/HDL:CHD Risk Coronary Heart Disease Risk Table                     Men   Women  1/2 Average Risk   3.4   3.3  Average Risk       5.0   4.4  2 X Average Risk   9.6   7.1  3 X Average Risk  23.4   11.0        Use the calculated Patient Ratio above and the CHD Risk Table to determine the patient's CHD Risk.         ATP III CLASSIFICATION (LDL):  <100     mg/dL   Optimal  629-528  mg/dL   Near or Above                    Optimal  130-159  mg/dL   Borderline  413-244  mg/dL   High  >010     mg/dL   Very High Performed at [facility], [address], [place], [place] [address]    Prolactin 09/21/2022 9.7  3.6 - 31.5 ng/mL Final   Comment: (NOTE) Performed At: [facility] [address] [place], [place] [id number] Jolene Schimke MD QV:[id number]    TSH 09/21/2022 0.487  0.350 - 4.500 uIU/mL Final   Comment: Performed by a 3rd Generation assay with a functional sensitivity of <=0.01 uIU/mL. Performed at [facility], [address], [place], [place] [address]    Neisseria Gonorrhea 09/21/2022 Negative   Final   Chlamydia 09/21/2022 Negative   Final   Comment 09/21/2022 Normal Reference Ranger Chlamydia - Negative   Final   Comment 09/21/2022 Normal Reference Range Neisseria Gonorrhea - Negative   Final   Color, Urine 09/21/2022 YELLOW  YELLOW Final   APPearance  09/21/2022 CLEAR  CLEAR Final   Specific Gravity, Urine 09/21/2022 1.029  1.005 - 1.030 Final   pH 09/21/2022 6.0  5.0 - 8.0 Final   Glucose, UA 09/21/2022 NEGATIVE  NEGATIVE mg/dL Final   Hgb urine dipstick 09/21/2022 NEGATIVE  NEGATIVE Final   Bilirubin Urine 09/21/2022 NEGATIVE  NEGATIVE Final   Ketones, ur 09/21/2022 5 (A)  NEGATIVE mg/dL Final   Protein, ur 96/08/5407 NEGATIVE  NEGATIVE mg/dL Final   Nitrite 81/19/1478 NEGATIVE  NEGATIVE Final   Leukocytes,Ua 09/21/2022 NEGATIVE  NEGATIVE Final   Performed at [facility], [address] [place], [place] [address]   POC Amphetamine UR 09/21/2022 Positive (A)  NONE DETECTED (Cut Off Level 1000 ng/mL) Final   POC Secobarbital (BAR) 09/21/2022 None Detected  NONE DETECTED (Cut Off Level 300 ng/mL) Final   POC Buprenorphine (BUP) 09/21/2022 None Detected  NONE DETECTED (Cut Off Level 10 ng/mL) Final   POC Oxazepam (BZO) 09/21/2022 Positive (A)  NONE  DETECTED (Cut Off Level 300 ng/mL) Final   POC Cocaine UR 09/21/2022 None Detected  NONE DETECTED (Cut Off Level 300 ng/mL) Final   POC Methamphetamine UR 09/21/2022 Positive (A)  NONE DETECTED (Cut Off Level 1000 ng/mL) Final   POC Morphine 09/21/2022 None Detected (A)  NONE DETECTED (Cut Off Level 300 ng/mL) Final   POC Methadone UR 09/21/2022 None Detected (A)  NONE DETECTED (Cut Off Level 300 ng/mL) Corrected   POC Oxycodone UR 09/21/2022 None Detected  NONE DETECTED (Cut Off Level 100 ng/mL) Final   POC Marijuana UR 09/21/2022 Positive (A)  NONE DETECTED (Cut Off Level 50 ng/mL) Final   Absolute CD 4 Helper 09/21/2022 758  359 - 1,519 /uL Final   % CD 4 Pos. Lymph. 09/21/2022 36.1  30.8 - 58.5 % Final   CD3+CD8+ Cells # Bld 09/21/2022 613  109 - 897 /uL Final   CD3+CD8+ Cells NFr Bld 09/21/2022 29.2  12.0 - 35.5 % Final   CD3+CD4+ Cells/CD3+CD8+ Cells Bld 09/21/2022 1.24  0.92 - 3.72 Final   WBC 09/21/2022 10.7  3.4 - 10.8 x10E3/uL Final   RBC 09/21/2022 4.50  4.14 - 5.80 x10E6/uL Final   Hemoglobin 09/21/2022 13.5  13.0 - 17.7 g/dL Final   Hematocrit 13/11/6576 39.4  37.5 - 51.0 % Final   MCV 09/21/2022 88  79 - 97 fL Final   MCH 09/21/2022 30.0  26.6 - 33.0 pg Final   MCHC 09/21/2022 34.3  31.5 - 35.7 g/dL Final   RDW 46/96/2952 12.2  11.6 - 15.4 % Final   Platelets 09/21/2022 252  150 - 450 x10E3/uL Final   Neutrophils 09/21/2022 71  Not Estab. % Final   Lymphs 09/21/2022 19  Not Estab. % Final   Monocytes 09/21/2022 9  Not Estab. % Final   Eos 09/21/2022 1  Not Estab. % Final   Basos 09/21/2022 0  Not Estab. % Final   Neutrophils Absolute 09/21/2022 7.5 (H)  1.4 - 7.0 x10E3/uL Final   Lymphocytes Absolute 09/21/2022 2.1  0.7 - 3.1 x10E3/uL Final   Monocytes Absolute 09/21/2022 0.9  0.1 - 0.9 x10E3/uL Final   EOS (ABSOLUTE) 09/21/2022 0.1  0.0 - 0.4 x10E3/uL Final   Basophils Absolute 09/21/2022 0.0  0.0 - 0.2 x10E3/uL Final   Immature Granulocytes 09/21/2022 0  Not Estab. %  Final   Immature Grans (Abs) 09/21/2022 0.0  0.0 - 0.1 x10E3/uL Final   Comment: (NOTE) Performed At: [facility] [address] [place], [place] [id number]  Jolene Schimke MD UJ:[id number]     Blood Alcohol level:  Lab Results  Component Value Date   ETH <10 09/21/2022   ETH <10 10/13/2018    Metabolic Disorder Labs: Lab Results  Component Value Date   HGBA1C 5.5 09/21/2022   MPG 111.15 09/21/2022   MPG 117 (H) 01/19/2013   Lab Results  Component Value Date   PROLACTIN 9.7 09/21/2022   Lab Results  Component Value Date   CHOL 124 09/21/2022   TRIG 84 09/21/2022   HDL 39 (L) 09/21/2022   CHOLHDL 3.2 09/21/2022   VLDL 17 09/21/2022   LDLCALC 68 09/21/2022   LDLCALC 66 12/15/2021    Therapeutic Lab Levels: No results found for: "LITHIUM" No results found for: "VALPROATE" No results found for: "CBMZ"  Physical Findings   AUDIT    Flowsheet Row [facility]) [facility] Admission (Discharged) [facility]  Alcohol Use Disorder Identification Test Final Score (AUDIT) 0 0      PHQ2-9    Flowsheet Row ED from 09/21/2022 in [facility] Office Visit from 02/23/2022 in [facility] for Infectious Disease Office Visit from 03/31/2021 in [facility] for Infectious Disease Office Visit from 08/25/2020 in [facility] for Infectious Disease Office Visit from 02/12/2020 in [facility] for Infectious Disease  PHQ-2 Total Score 0 0 3 0 6  PHQ-9 Total Score 4 -- 21 -- 21      Flowsheet Row ED from 09/21/2022 in [facility] Most recent reading at 09/21/2022  2:42 PM ED from 09/21/2022 in [facility] Most recent reading at 09/21/2022  9:22 AM ED from 09/20/2022 in [facility] Most  recent reading at 09/20/2022  6:34 PM  C-SSRS RISK CATEGORY No Risk No Risk No Risk        Musculoskeletal  Strength & Muscle Tone: within normal limits Gait & Station: normal Patient leans: N/A  Psychiatric Specialty Exam  Presentation  General Appearance:  Casual (covering self with blanket while walking around)  Eye Contact: Good  Speech: Clear and Coherent; Normal Rate  Speech Volume: Normal  Handedness: Right   Mood and Affect  Mood: Dysphoric  Affect: Congruent   Thought Process  Thought Processes: Coherent; Goal Directed  Descriptions of Associations:Intact  Orientation:Full (Time, Place and Person)  Thought Content:WDL; Logical  Diagnosis of Schizophrenia or Schizoaffective disorder in past: Yes  Duration of Psychotic Symptoms: Greater than six months   Hallucinations:Hallucinations: None  Ideas of Reference:None  Suicidal Thoughts:Suicidal Thoughts: No  Homicidal Thoughts:Homicidal Thoughts: No   Sensorium  Memory: Immediate Fair; Recent Fair  Judgment: Good  Insight: Good   Executive Functions  Concentration: Good  Attention Span: Good  Recall: Good  Fund of Knowledge: Good  Language: Good   Psychomotor Activity  Psychomotor Activity: Psychomotor Activity: Normal   Assets  Assets: Communication Skills; Desire for Improvement; Resilience; Physical Health   Sleep  Sleep: Sleep: Poor   No data recorded  Physical Exam  Physical Exam Vitals and nursing note reviewed.  Constitutional:      General: He is not in acute distress.    Appearance: Normal appearance. He is normal weight. He is not ill-appearing or toxic-appearing.  HENT:     Head: Normocephalic and atraumatic.  Pulmonary:     Effort: Pulmonary effort is normal.  Musculoskeletal:  General: Normal range of motion.  Neurological:     General: No focal deficit present.     Mental Status: He is alert.    Review of Systems   Respiratory:  Negative for cough and shortness of breath.   Cardiovascular:  Negative for chest pain.  Gastrointestinal:  Negative for abdominal pain, constipation, diarrhea, nausea and vomiting.  Neurological:  Negative for dizziness, weakness and headaches.  Psychiatric/Behavioral:  Negative for depression, hallucinations and suicidal ideas. The patient is not nervous/anxious.    Blood pressure (!) 126/90, pulse 88, temperature 98.3 F (36.8 C), temperature source Oral, resp. rate 18, SpO2 99 %. There is no height or weight on file to calculate BMI.  Treatment Plan Summary: Daily contact with patient to assess and evaluate symptoms and progress in treatment and Medication management  Devon Hernandez is a 27 yr old male who presented on 5/22 to [facility] for detox from Meth.  PPHx is significant for Paranoid Schizophrenia, PTSD, ODD, Schizoaffective disorder, ADHD, borderline personality disorder, and Meth Abuse, and 4 Suicide Attempts and 3 Psychiatric Hospitalizations (last age 35).  PMHx is significant for HIV.   Sinan is tolerating Detox well.  He tolerated the starting of Remeron.  He will receive his second dose of [name] tomorrow.  We will not make any changes to his medications at this time.   Schizoaffective Disorder, Depressive Type: -Received Invega Sustenna 234 mg IM on 09/21/2022 -Will receive Invega Sustenna 156 mg IM on 09/25/2022 -Continue Remeron SOL-TAB 15 mg QHS   HIV: -Continue Biktarvy 50-200-25 mg   -Continue benzocaine 10% BID PRN -Continue PRN's: Tylenol, Maalox, Atarax, Milk of Magnesia, Ibuprofen, Trazodone   Dispo: Home on 5/28    Lauro Franklin, MD 09/24/2022 2:56 PM

## 2022-09-24 NOTE — ED Notes (Signed)
Patient was provided with lunch 

## 2022-09-24 NOTE — ED Notes (Signed)
Patient was provided with breakfast 

## 2022-09-24 NOTE — ED Notes (Addendum)
Patient was seen crying during safety checks, complaining of backache. Snacks given and reassured as well.Respirations are even and unlabored. No acute distress noted. Will continue to monitor for safety.

## 2022-09-24 NOTE — Group Note (Signed)
Group Topic: Relapse and Recovery  Group Date: 09/24/2022 Start Time: 1957 End Time: 2100 Facilitators: Levander Campion  Department: [facility]  Number of Participants: 7  Group Focus: problem solving and substance abuse education Treatment Modality:  Behavior Modification Therapy and Solution-Focused Therapy Interventions utilized were patient education, story telling, and support Purpose: regain self-worth and relapse prevention strategies  Name: Devon Hernandez Date of Birth: [birth year]  MR: [id number]    Level of Participation: minimal Quality of Participation: attentive and cooperative Interactions with others: didn't give feedback Mood/Affect: appropriate Triggers (if applicable): n/a Cognition: coherent/clear Progress: Moderate Response: n/a Plan: follow-up needed  Patients Problems:  Patient Active Problem List   Diagnosis Date Noted   Bipolar affective disorder, current episode depressed (HCC) 09/21/2022   Borderline personality disorder (HCC) 09/21/2022   Methamphetamine use disorder, moderate, in controlled environment (HCC) 09/21/2022   Cannabis use disorder, severe, in controlled environment, dependence (HCC) 09/21/2022   Schizoaffective disorder (HCC) 09/21/2022   Schizoaffective disorder, depressive type (HCC) 09/21/2022   Syphilis (acquired) 02/23/2022   Depression 03/31/2021   Rectal bleeding 08/27/2020   Rectal discharge 08/27/2020   Bloating 08/27/2020   Constipation 08/25/2020   Routine screening for STI (sexually transmitted infection) 04/22/2019   Encounter for long-term (current) use of high-risk medication 04/22/2019   Major depressive disorder, recurrent episode, mild (HCC) 10/13/2018   Human immunodeficiency virus (HIV) disease (HCC) 07/23/2018   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

## 2022-09-24 NOTE — ED Notes (Signed)
Patient accepted scheduled meds w/o difficulty. Denied SI/HI/AVH. Voices needs to staff. Currently resting in assigned room in no observed distress. Safety maintained and will continue to monitor.

## 2022-09-24 NOTE — Group Note (Signed)
Group Topic: Positive Affirmations  Group Date: 09/24/2022 Start Time: 1210 End Time: 1240 Facilitators: Maeola Sarah  Department: [facility]  Number of Participants: 8  Group Focus: affirmation Treatment Modality:  Psychoeducation Interventions utilized were patient education Purpose: regain self-worth and reinforce self-care  Name: Devon Hernandez Date of Birth: Aug 09, 1995  MR: [id number]    Level of Participation: active Quality of Participation: attentive and cooperative Interactions with others: gave feedback Mood/Affect: appropriate Triggers (if applicable): N/A Cognition: coherent/clear Progress: Gaining insight Response: Patient stated that his positive affirmation will be "I'm stronger than I think". Plan: patient will be encouraged to continue attending groups  Patients Problems:  Patient Active Problem List   Diagnosis Date Noted   Bipolar affective disorder, current episode depressed (HCC) 09/21/2022   Borderline personality disorder (HCC) 09/21/2022   Methamphetamine use disorder, moderate, in controlled environment (HCC) 09/21/2022   Cannabis use disorder, severe, in controlled environment, dependence (HCC) 09/21/2022   Schizoaffective disorder (HCC) 09/21/2022   Schizoaffective disorder, depressive type (HCC) 09/21/2022   Syphilis (acquired) 02/23/2022   Depression 03/31/2021   Rectal bleeding 08/27/2020   Rectal discharge 08/27/2020   Bloating 08/27/2020   Constipation 08/25/2020   Routine screening for STI (sexually transmitted infection) 04/22/2019   Encounter for long-term (current) use of high-risk medication 04/22/2019   Major depressive disorder, recurrent episode, mild (HCC) 10/13/2018   Human immunodeficiency virus (HIV) disease (HCC) 07/23/2018   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

## 2022-09-24 NOTE — Group Note (Signed)
Group Topic: Recovery Basics  Group Date: 09/24/2022 Start Time: 1600 End Time: 1630 Facilitators: Jenean Lindau, RN  Department: [facility]  Number of Participants: 7  Group Focus: chemical dependency education and chemical dependency issues Treatment Modality:  Psychoeducation Interventions utilized were patient education and reality testing Purpose: enhance coping skills, explore maladaptive thinking, express feelings, express irrational fears, improve communication skills, increase insight, regain self-worth, relapse prevention strategies, and trigger / craving management  Name: Devon Hernandez Date of Birth: [birth year]  MR: [id number]    Level of Participation: moderate Quality of Participation: attentive Interactions with others: gave feedback Mood/Affect: appropriate Triggers (if applicable):   Cognition: logical Progress: Moderate Response:   Plan: follow-up needed  Patients Problems:  Patient Active Problem List   Diagnosis Date Noted   Bipolar affective disorder, current episode depressed (HCC) 09/21/2022   Borderline personality disorder (HCC) 09/21/2022   Methamphetamine use disorder, moderate, in controlled environment (HCC) 09/21/2022   Cannabis use disorder, severe, in controlled environment, dependence (HCC) 09/21/2022   Schizoaffective disorder (HCC) 09/21/2022   Schizoaffective disorder, depressive type (HCC) 09/21/2022   Syphilis (acquired) 02/23/2022   Depression 03/31/2021   Rectal bleeding 08/27/2020   Rectal discharge 08/27/2020   Bloating 08/27/2020   Constipation 08/25/2020   Routine screening for STI (sexually transmitted infection) 04/22/2019   Encounter for long-term (current) use of high-risk medication 04/22/2019   Major depressive disorder, recurrent episode, mild (HCC) 10/13/2018   Human immunodeficiency virus (HIV) disease (HCC) 07/23/2018   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

## 2022-09-25 ENCOUNTER — Encounter (HOSPITAL_COMMUNITY): Payer: Self-pay | Admitting: Registered Nurse

## 2022-09-25 DIAGNOSIS — F1520 Other stimulant dependence, uncomplicated: Secondary | ICD-10-CM | POA: Diagnosis not present

## 2022-09-25 MED ORDER — SENNOSIDES-DOCUSATE SODIUM 8.6-50 MG PO TABS
1.0000 | ORAL_TABLET | Freq: Once | ORAL | Status: AC
  Administered 2022-09-25: 1 via ORAL
  Filled 2022-09-25: qty 1

## 2022-09-25 MED ORDER — TRAZODONE HCL 100 MG PO TABS
100.0000 mg | ORAL_TABLET | Freq: Every evening | ORAL | Status: DC | PRN
  Administered 2022-09-25 – 2022-09-26 (×2): 100 mg via ORAL
  Filled 2022-09-25 (×2): qty 1

## 2022-09-25 NOTE — ED Provider Notes (Signed)
Behavioral Health Progress Note  Date and Time: 09/25/2022 11:06 AM Name: Devon Hernandez MRN:  [id number]  Subjective:   Devon Hernandez is a 27 yr old male who presented on 5/22 to [facility] for detox from Meth.  PPHx is significant for Paranoid Schizophrenia, PTSD, ODD, Schizoaffective disorder, ADHD, borderline personality disorder, and Meth Abuse, and 4 Suicide Attempts and 3 Psychiatric Hospitalizations (last age 72).  PMHx is significant for HIV.  He reports no withdrawal symptoms today.  He reports no cravings today.  He reports that he is still having poor sleep.  Discussed further increasing his trazodone and he was agreeable with this.  He reports he has been constipated and unable to have a BM.  Discussed a one-time dose of Senokot and he is agreeable to this.  He reports no SI, HI, or AVH.  He reports his sleep was poor last night.  He reports appetite is good.  He reports no other concerns at present.  Diagnosis:  Final diagnoses:  Methamphetamine use disorder, moderate, in controlled environment (HCC) [F15.20]  Other depression  Human immunodeficiency virus (HIV) disease (HCC)    Total Time spent with patient: 30 minutes  Past Psychiatric History: Paranoid Schizophrenia, PTSD, ODD, Schizoaffective disorder, ADHD, borderline personality disorder, and Meth Abuse, and 4 Suicide Attempts and 3 Psychiatric Hospitalizations (last age 42). Past Medical History: HIV. Family History: Maternal Grandmother- Seizures, Aneurysm, Maternal Aunt- Diabetes, Mother- HTN Family Psychiatric  History: Mother- Depression, Father- Depression and PTSD Social History: has to return to court on 10/03/22 for larceny charges  Additional Social History:                         Sleep: Poor   Appetite:  Good  Current Medications:  Current Facility-Administered Medications  Medication Dose Route Frequency Provider Last Rate Last Admin   acetaminophen (TYLENOL) tablet 650 mg  650 mg Oral Q6H PRN  Rankin, Shuvon B, NP   650 mg at 09/21/22 2228   alum & mag hydroxide-simeth (MAALOX/MYLANTA) 200-200-20 MG/5ML suspension 30 mL  30 mL Oral Q4H PRN Rankin, Shuvon B, NP       benzocaine (ORAJEL) 10 % mucosal gel   Mouth/Throat BID PRN Park Pope, MD   Given at 09/22/22 1513   bictegravir-emtricitabine-tenofovir AF (BIKTARVY) 50-200-25 MG per tablet 1 tablet  1 tablet Oral Daily Rankin, Shuvon B, NP   1 tablet at 09/25/22 0934   hydrOXYzine (ATARAX) tablet 25 mg  25 mg Oral TID PRN Rankin, Shuvon B, NP   25 mg at 09/24/22 2143   ibuprofen (ADVIL) tablet 200 mg  200 mg Oral Q6H PRN Park Pope, MD   200 mg at 09/24/22 0316   magnesium hydroxide (MILK OF MAGNESIA) suspension 30 mL  30 mL Oral Daily PRN Rankin, Shuvon B, NP   30 mL at 09/25/22 0331   mirtazapine (REMERON SOL-TAB) disintegrating tablet 15 mg  15 mg Oral Seabron Spates, MD   15 mg at 09/24/22 2143   mupirocin ointment (BACTROBAN) 2 %   Topical BID Georgina Quint, MD       senna-docusate (Senokot-S) tablet 1 tablet  1 tablet Oral Once Lauro Franklin, MD       traZODone (DESYREL) tablet 100 mg  100 mg Oral QHS PRN Lauro Franklin, MD       Current Outpatient Medications  Medication Sig Dispense Refill   acetaminophen (TYLENOL) 500 MG tablet Take 1,000 mg by mouth  every 6 (six) hours as needed (For tooth pain).     Aspirin-Acetaminophen-Caffeine (GOODYS EXTRA STRENGTH) 520-260-32.5 MG PACK Take 2 packets by mouth every 6 (six) hours as needed (For tooth pain).     BIKTARVY 50-200-25 MG TABS tablet TAKE 1 TABLET BY MOUTH 1 TIME A DAY. (Patient taking differently: 1 tablet daily.) 30 tablet 0   neomycin-bacitracin-polymyxin (NEOSPORIN) [phone] ointment Apply 1 Application topically 4 (four) times daily as needed (Apply to affected area).      Labs  Lab Results:  Admission on 09/21/2022  Component Date Value Ref Range Status   HIV 1 RNA Quant 09/21/2022 59,800  copies/mL Corrected   Comment: (NOTE) The reportable  range for this assay is 20 to 10,000,000 copies HIV-1 RNA/mL.    LOG10 HIV-1 RNA 09/21/2022 4.777  log10copy/mL Final   Comment: (NOTE) Performed At: [facility] [address] [place], [place] [id number] Jolene Schimke MD WU:[id number]    WBC 09/21/2022 9.3  4.0 - 10.5 K/uL Final   RBC 09/21/2022 4.63  4.22 - 5.81 MIL/uL Final   Hemoglobin 09/21/2022 13.6  13.0 - 17.0 g/dL Final   HCT 95/62/1308 41.0  39.0 - 52.0 % Final   MCV 09/21/2022 88.6  80.0 - 100.0 fL Final   MCH 09/21/2022 29.4  26.0 - 34.0 pg Final   MCHC 09/21/2022 33.2  30.0 - 36.0 g/dL Final   RDW 65/78/4696 12.8  11.5 - 15.5 % Final   Platelets 09/21/2022 275  150 - 400 K/uL Final   nRBC 09/21/2022 0.0  0.0 - 0.2 % Final   Neutrophils Relative % 09/21/2022 61  % Final   Neutro Abs 09/21/2022 5.6  1.7 - 7.7 K/uL Final   Lymphocytes Relative 09/21/2022 28  % Final   Lymphs Abs 09/21/2022 2.6  0.7 - 4.0 K/uL Final   Monocytes Relative 09/21/2022 10  % Final   Monocytes Absolute 09/21/2022 0.9  0.1 - 1.0 K/uL Final   Eosinophils Relative 09/21/2022 1  % Final   Eosinophils Absolute 09/21/2022 0.1  0.0 - 0.5 K/uL Final   Basophils Relative 09/21/2022 0  % Final   Basophils Absolute 09/21/2022 0.0  0.0 - 0.1 K/uL Final   Immature Granulocytes 09/21/2022 0  % Final   Abs Immature Granulocytes 09/21/2022 0.02  0.00 - 0.07 K/uL Final   Performed at [facility], [address] [place], [place] [address]   Sodium 09/21/2022 137  135 - 145 mmol/L Final   Potassium 09/21/2022 3.7  3.5 - 5.1 mmol/L Final   Chloride 09/21/2022 102  98 - 111 mmol/L Final   CO2 09/21/2022 24  22 - 32 mmol/L Final   Glucose, Bld 09/21/2022 96  70 - 99 mg/dL Final   Glucose reference range applies only to samples taken after fasting for at least 8 hours.   BUN 09/21/2022 13  6 - 20 mg/dL Final   Creatinine, Ser 09/21/2022 0.85  0.61 - 1.24 mg/dL Final   Calcium 41/32/4401 8.7 (L)  8.9 - 10.3 mg/dL Final   Total Protein 02/72/5366  6.9  6.5 - 8.1 g/dL Final   Albumin 44/06/4740 3.9  3.5 - 5.0 g/dL Final   AST 59/56/3875 23  15 - 41 U/L Final   ALT 09/21/2022 19  0 - 44 U/L Final   Alkaline Phosphatase 09/21/2022 70  38 - 126 U/L Final   Total Bilirubin 09/21/2022 0.8  0.3 - 1.2 mg/dL Final   GFR, Estimated 09/21/2022 >60  >60  mL/min Final   Comment: (NOTE) Calculated using the CKD-EPI Creatinine Equation (2021)    Anion gap 09/21/2022 11  5 - 15 Final   Performed at [facility], [address] [place], [place] [address]   Hgb A1c MFr Bld 09/21/2022 5.5  4.8 - 5.6 % Final   Comment: (NOTE) Pre diabetes:          5.7%-6.4%  Diabetes:              >6.4%  Glycemic control for   <7.0% adults with diabetes    Mean Plasma Glucose 09/21/2022 111.15  mg/dL Final   Performed at [facility], [address] [place], [place] [address]   Magnesium 09/21/2022 2.2  1.7 - 2.4 mg/dL Final   Performed at [facility], [address], [place], [place] [address]   Alcohol, Ethyl (B) 09/21/2022 <10  <10 mg/dL Final   Comment: (NOTE) Lowest detectable limit for serum alcohol is 10 mg/dL.  For medical purposes only. Performed at [facility], [address] [place], [place] [address]    Cholesterol 09/21/2022 124  0 - 200 mg/dL Final   Triglycerides 29/56/2130 84  <150 mg/dL Final   HDL 86/57/8469 39 (L)  >40 mg/dL Final   Total CHOL/HDL Ratio 09/21/2022 3.2  RATIO Final   VLDL 09/21/2022 17  0 - 40 mg/dL Final   LDL Cholesterol 09/21/2022 68  0 - 99 mg/dL Final   Comment:        Total Cholesterol/HDL:CHD Risk Coronary Heart Disease Risk Table                     Men   Women  1/2 Average Risk   3.4   3.3  Average Risk       5.0   4.4  2 X Average Risk   9.6   7.1  3 X Average Risk  23.4   11.0        Use the calculated Patient Ratio above and the CHD Risk Table to determine the patient's CHD Risk.        ATP III CLASSIFICATION (LDL):  <100     mg/dL   Optimal  629-528  mg/dL   Near or  Above                    Optimal  130-159  mg/dL   Borderline  413-244  mg/dL   High  >010     mg/dL   Very High Performed at [facility], [address], [place], [place] [address]    Prolactin 09/21/2022 9.7  3.6 - 31.5 ng/mL Final   Comment: (NOTE) Performed At: [facility] [address] [place], [place] [id number] Jolene Schimke MD QV:[id number]    TSH 09/21/2022 0.487  0.350 - 4.500 uIU/mL Final   Comment: Performed by a 3rd Generation assay with a functional sensitivity of <=0.01 uIU/mL. Performed at [facility], [address], [place], [place] [address]    Neisseria Gonorrhea 09/21/2022 Negative   Final   Chlamydia 09/21/2022 Negative   Final   Comment 09/21/2022 Normal Reference Ranger Chlamydia - Negative   Final   Comment 09/21/2022 Normal Reference Range Neisseria Gonorrhea - Negative   Final   Color, Urine 09/21/2022 YELLOW  YELLOW Final   APPearance 09/21/2022 CLEAR  CLEAR Final   Specific Gravity, Urine 09/21/2022 1.029  1.005 - 1.030 Final  pH 09/21/2022 6.0  5.0 - 8.0 Final   Glucose, UA 09/21/2022 NEGATIVE  NEGATIVE mg/dL Final   Hgb urine dipstick 09/21/2022 NEGATIVE  NEGATIVE Final   Bilirubin Urine 09/21/2022 NEGATIVE  NEGATIVE Final   Ketones, ur 09/21/2022 5 (A)  NEGATIVE mg/dL Final   Protein, ur 16/01/9603 NEGATIVE  NEGATIVE mg/dL Final   Nitrite 54/12/8117 NEGATIVE  NEGATIVE Final   Leukocytes,Ua 09/21/2022 NEGATIVE  NEGATIVE Final   Performed at [facility], [address] [place], [place] [address]   POC Amphetamine UR 09/21/2022 Positive (A)  NONE DETECTED (Cut Off Level 1000 ng/mL) Final   POC Secobarbital (BAR) 09/21/2022 None Detected  NONE DETECTED (Cut Off Level 300 ng/mL) Final   POC Buprenorphine (BUP) 09/21/2022 None Detected  NONE DETECTED (Cut Off Level 10 ng/mL) Final   POC Oxazepam (BZO) 09/21/2022 Positive (A)  NONE DETECTED (Cut Off Level 300 ng/mL) Final   POC Cocaine UR 09/21/2022 None Detected  NONE  DETECTED (Cut Off Level 300 ng/mL) Final   POC Methamphetamine UR 09/21/2022 Positive (A)  NONE DETECTED (Cut Off Level 1000 ng/mL) Final   POC Morphine 09/21/2022 None Detected (A)  NONE DETECTED (Cut Off Level 300 ng/mL) Final   POC Methadone UR 09/21/2022 None Detected (A)  NONE DETECTED (Cut Off Level 300 ng/mL) Corrected   POC Oxycodone UR 09/21/2022 None Detected  NONE DETECTED (Cut Off Level 100 ng/mL) Final   POC Marijuana UR 09/21/2022 Positive (A)  NONE DETECTED (Cut Off Level 50 ng/mL) Final   Absolute CD 4 Helper 09/21/2022 758  359 - 1,519 /uL Final   % CD 4 Pos. Lymph. 09/21/2022 36.1  30.8 - 58.5 % Final   CD3+CD8+ Cells # Bld 09/21/2022 613  109 - 897 /uL Final   CD3+CD8+ Cells NFr Bld 09/21/2022 29.2  12.0 - 35.5 % Final   CD3+CD4+ Cells/CD3+CD8+ Cells Bld 09/21/2022 1.24  0.92 - 3.72 Final   WBC 09/21/2022 10.7  3.4 - 10.8 x10E3/uL Final   RBC 09/21/2022 4.50  4.14 - 5.80 x10E6/uL Final   Hemoglobin 09/21/2022 13.5  13.0 - 17.7 g/dL Final   Hematocrit 95/62/1308 39.4  37.5 - 51.0 % Final   MCV 09/21/2022 88  79 - 97 fL Final   MCH 09/21/2022 30.0  26.6 - 33.0 pg Final   MCHC 09/21/2022 34.3  31.5 - 35.7 g/dL Final   RDW 65/78/4696 12.2  11.6 - 15.4 % Final   Platelets 09/21/2022 252  150 - 450 x10E3/uL Final   Neutrophils 09/21/2022 71  Not Estab. % Final   Lymphs 09/21/2022 19  Not Estab. % Final   Monocytes 09/21/2022 9  Not Estab. % Final   Eos 09/21/2022 1  Not Estab. % Final   Basos 09/21/2022 0  Not Estab. % Final   Neutrophils Absolute 09/21/2022 7.5 (H)  1.4 - 7.0 x10E3/uL Final   Lymphocytes Absolute 09/21/2022 2.1  0.7 - 3.1 x10E3/uL Final   Monocytes Absolute 09/21/2022 0.9  0.1 - 0.9 x10E3/uL Final   EOS (ABSOLUTE) 09/21/2022 0.1  0.0 - 0.4 x10E3/uL Final   Basophils Absolute 09/21/2022 0.0  0.0 - 0.2 x10E3/uL Final   Immature Granulocytes 09/21/2022 0  Not Estab. % Final   Immature Grans (Abs) 09/21/2022 0.0  0.0 - 0.1 x10E3/uL Final   Comment:  (NOTE) Performed At: [facility] [address] [place], [place] [id number] Jolene Schimke MD GM:[id number]     Blood Alcohol level:  Lab Results  Component Value Date  ETH <10 09/21/2022   ETH <10 10/13/2018    Metabolic Disorder Labs: Lab Results  Component Value Date   HGBA1C 5.5 09/21/2022   MPG 111.15 09/21/2022   MPG 117 (H) 01/19/2013   Lab Results  Component Value Date   PROLACTIN 9.7 09/21/2022   Lab Results  Component Value Date   CHOL 124 09/21/2022   TRIG 84 09/21/2022   HDL 39 (L) 09/21/2022   CHOLHDL 3.2 09/21/2022   VLDL 17 09/21/2022   LDLCALC 68 09/21/2022   LDLCALC 66 12/15/2021    Therapeutic Lab Levels: No results found for: "LITHIUM" No results found for: "VALPROATE" No results found for: "CBMZ"  Physical Findings   AUDIT    Flowsheet Row [facility]) [facility] Admission (Discharged) [facility]  Alcohol Use Disorder Identification Test Final Score (AUDIT) 0 0      PHQ2-9    Flowsheet Row ED from 09/21/2022 in [facility] Office Visit from 02/23/2022 in [facility]ious [facility] Office Visit from 03/31/2021 in [facility] for Infectious Disease Office Visit from 08/25/2020 in [facility] for Infectious Disease Office Visit from 02/12/2020 in [facility] for Infectious Disease  PHQ-2 Total Score 0 0 3 0 6  PHQ-9 Total Score 4 -- 21 -- 21      Flowsheet Row ED from 09/21/2022 in [facility] Most recent reading at 09/21/2022  2:42 PM ED from 09/21/2022 in [facility] Most recent reading at 09/21/2022  9:22 AM ED from 09/20/2022 in [facility] Most recent reading at 09/20/2022  6:34 PM  C-SSRS RISK CATEGORY No Risk No Risk No Risk         Musculoskeletal  Strength & Muscle Tone: within normal limits Gait & Station: normal Patient leans: N/A  Psychiatric Specialty Exam  Presentation  General Appearance:  Appropriate for Environment; Casual  Eye Contact: Good  Speech: Clear and Coherent; Normal Rate  Speech Volume: Normal  Handedness: Right   Mood and Affect  Mood: -- ("ok")  Affect: Congruent; Appropriate   Thought Process  Thought Processes: Coherent; Goal Directed  Descriptions of Associations:Intact  Orientation:Full (Time, Place and Person)  Thought Content:Logical; WDL  Diagnosis of Schizophrenia or Schizoaffective disorder in past: Yes  Duration of Psychotic Symptoms: Greater than six months   Hallucinations:Hallucinations: None  Ideas of Reference:None  Suicidal Thoughts:Suicidal Thoughts: No  Homicidal Thoughts:Homicidal Thoughts: No   Sensorium  Memory: Immediate Fair; Recent Fair  Judgment: Good  Insight: Good   Executive Functions  Concentration: Good  Attention Span: Good  Recall: Good  Fund of Knowledge: Good  Language: Good   Psychomotor Activity  Psychomotor Activity: Psychomotor Activity: Normal   Assets  Assets: Communication Skills; Desire for Improvement; Resilience; Physical Health   Sleep  Sleep: Sleep: Poor   No data recorded  Physical Exam  Physical Exam Vitals and nursing note reviewed.  Constitutional:      General: He is not in acute distress.    Appearance: Normal appearance. He is normal weight. He is not ill-appearing or toxic-appearing.  HENT:     Head: Normocephalic and atraumatic.  Pulmonary:     Effort: Pulmonary effort is normal.  Musculoskeletal:        General: Normal range of motion.  Neurological:     General: No focal deficit  present.     Mental Status: He is alert.    Review of Systems  Respiratory:  Negative for cough and shortness of breath.   Cardiovascular:  Negative for chest pain.   Gastrointestinal:  Negative for abdominal pain, diarrhea, nausea and vomiting.  Neurological:  Negative for dizziness, weakness and headaches.  Psychiatric/Behavioral:  Negative for depression, hallucinations and suicidal ideas. The patient is not nervous/anxious.    Blood pressure 117/74, pulse 93, temperature 98.4 F (36.9 C), temperature source Oral, resp. rate 18, SpO2 100 %. There is no height or weight on file to calculate BMI.  Treatment Plan Summary: Daily contact with patient to assess and evaluate symptoms and progress in treatment and Medication management  Tad Edmond is a 27 yr old male who presented on 5/22 to [facility] for detox from Meth.  PPHx is significant for Paranoid Schizophrenia, PTSD, ODD, Schizoaffective disorder, ADHD, borderline personality disorder, and Meth Abuse, and 4 Suicide Attempts and 3 Psychiatric Hospitalizations (last age 44).  PMHx is significant for HIV.   Jasim is having no withdrawal symptoms.  He received his second loading dose of [name] this morning without issue.  He continues to have issues with sleep so we will increase his PRN Trazodone.  He has been having constipation so a one-time dose of  Senokot will be ordered.  If he does not have a BM tomorrow may need further treatment.  We will not make any other changes to his medications at this time.  We will continue to monitor.   Schizoaffective Disorder, Depressive Type: -Received Invega Sustenna 234 mg IM on 09/21/2022 -Received Hinda Glatter Sustenna 156 mg IM today -Continue Remeron SOL-TAB 15 mg QHS   Constipation: -One time dose Senokot-S 1 tablet   HIV: -Continue Biktarvy 50-200-25 mg   -Continue benzocaine 10% BID PRN -Continue PRN's: Tylenol, Maalox, Atarax, Milk of Magnesia, Ibuprofen -Increase PRN Trazodone to 100 mg QHS   Dispo: Home on 5/28    Lauro Franklin, MD 09/25/2022 11:06 AM

## 2022-09-25 NOTE — ED Notes (Signed)
Pt observed/assessed in room sleeping. RR even and unlabored, appearing in no noted distress. Environmental check complete, will continue to monitor for safety 

## 2022-09-25 NOTE — ED Notes (Signed)
Patient A&Ox4. Denies intent to harm self/others when asked. Denies A/VH. Pt reports not having a "normal" BM despite given MOM last night. MD placed order for Senna. Support and encouragement provided. Routine safety checks conducted according to facility protocol. Encouraged patient to notify staff if thoughts of harm toward self or others arise. Patient verbalize understanding and agreement. Will continue to monitor for safety.

## 2022-09-25 NOTE — ED Notes (Signed)
Pt sleeping in no acute distress. RR even and unlabored. Environment secured. Will continue to monitor for safety. 

## 2022-09-25 NOTE — Group Note (Signed)
Group Topic: Wellness  Group Date: 09/25/2022 Start Time: 0900 End Time: 0930 Facilitators: Prentice Docker, RN  Department: [facility]  Number of Participants: 8  Group Focus: nursing group Treatment Modality:  Psychoeducation Interventions utilized were patient education Purpose: medication education  Name: Devon Hernandez Date of Birth: 1995-06-12  MR: [id number]    Level of Participation: active Quality of Participation: cooperative Interactions with others: gave feedback Mood/Affect: positive Triggers (if applicable): none Cognition: insightful Progress: Gaining insight Response: information well received Plan: patient will be encouraged to continue medication compliance  Patients Problems:  Patient Active Problem List   Diagnosis Date Noted   Bipolar affective disorder, current episode depressed (HCC) 09/21/2022   Borderline personality disorder (HCC) 09/21/2022   Methamphetamine use disorder, moderate, in controlled environment (HCC) 09/21/2022   Cannabis use disorder, severe, in controlled environment, dependence (HCC) 09/21/2022   Schizoaffective disorder (HCC) 09/21/2022   Schizoaffective disorder, depressive type (HCC) 09/21/2022   Syphilis (acquired) 02/23/2022   Depression 03/31/2021   Rectal bleeding 08/27/2020   Rectal discharge 08/27/2020   Bloating 08/27/2020   Constipation 08/25/2020   Routine screening for STI (sexually transmitted infection) 04/22/2019   Encounter for long-term (current) use of high-risk medication 04/22/2019   Major depressive disorder, recurrent episode, mild (HCC) 10/13/2018   Human immunodeficiency virus (HIV) disease (HCC) 07/23/2018   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

## 2022-09-25 NOTE — ED Notes (Signed)
Patient is calm and cooperative. Resting in his room. He did not go outside with the other patients, seems to isolate himself. He is pleasant had no complaints feels much better since coming here. Will continue to monitor for safety

## 2022-09-25 NOTE — Group Note (Signed)
Group Topic: Positive Affirmations  Group Date: 09/25/2022 Start Time: 1030 End Time: 1200 Facilitators: Vonzell Schlatter B  Department: [facility]  Number of Participants: 8  Group Focus: daily focus Treatment Modality:  Psychoeducation Interventions utilized were patient education Purpose: regain self-worth  Name: Devon Hernandez Date of Birth: 08/01/1995  MR: [id number]    Level of Participation: minimal Quality of Participation: attentive and cooperative Interactions with others: gave feedback Mood/Affect: positive Triggers (if applicable): na Cognition: coherent/clear Progress: Moderate Response: na Plan: follow-up needed  Patients Problems:  Patient Active Problem List   Diagnosis Date Noted   Bipolar affective disorder, current episode depressed (HCC) 09/21/2022   Borderline personality disorder (HCC) 09/21/2022   Methamphetamine use disorder, moderate, in controlled environment (HCC) 09/21/2022   Cannabis use disorder, severe, in controlled environment, dependence (HCC) 09/21/2022   Schizoaffective disorder (HCC) 09/21/2022   Schizoaffective disorder, depressive type (HCC) 09/21/2022   Syphilis (acquired) 02/23/2022   Depression 03/31/2021   Rectal bleeding 08/27/2020   Rectal discharge 08/27/2020   Bloating 08/27/2020   Constipation 08/25/2020   Routine screening for STI (sexually transmitted infection) 04/22/2019   Encounter for long-term (current) use of high-risk medication 04/22/2019   Major depressive disorder, recurrent episode, mild (HCC) 10/13/2018   Human immunodeficiency virus (HIV) disease (HCC) 07/23/2018   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

## 2022-09-26 DIAGNOSIS — F1520 Other stimulant dependence, uncomplicated: Secondary | ICD-10-CM | POA: Diagnosis not present

## 2022-09-26 MED ORDER — POLYETHYLENE GLYCOL 3350 17 G PO PACK
17.0000 g | PACK | Freq: Every day | ORAL | Status: DC
  Filled 2022-09-26 (×2): qty 1

## 2022-09-26 MED ORDER — SENNOSIDES-DOCUSATE SODIUM 8.6-50 MG PO TABS
1.0000 | ORAL_TABLET | Freq: Every day | ORAL | Status: DC
  Administered 2022-09-27: 1 via ORAL
  Filled 2022-09-26 (×2): qty 1

## 2022-09-26 NOTE — Group Note (Signed)
Group Topic: Wellness  Group Date: 09/26/2022 Start Time: 1000 End Time: 1100 Facilitators: Vonzell Schlatter B  Department: [facility]  Number of Participants: 8  Group Focus: substance abuse education AA Group Treatment Modality:  Psychoeducation Interventions utilized were problem solving Purpose: increase insight  Name: Devon Hernandez Date of Birth: 1995-10-13  MR: [id number]    Level of Participation: minimal Quality of Participation: attentive and cooperative Interactions with others: gave feedback Mood/Affect: positive Triggers (if applicable): na Cognition: coherent/clear Progress: Moderate Response: na Plan: follow-up needed  Patients Problems:  Patient Active Problem List   Diagnosis Date Noted   Bipolar affective disorder, current episode depressed (HCC) 09/21/2022   Borderline personality disorder (HCC) 09/21/2022   Methamphetamine use disorder, moderate, in controlled environment (HCC) 09/21/2022   Cannabis use disorder, severe, in controlled environment, dependence (HCC) 09/21/2022   Schizoaffective disorder (HCC) 09/21/2022   Schizoaffective disorder, depressive type (HCC) 09/21/2022   Syphilis (acquired) 02/23/2022   Depression 03/31/2021   Rectal bleeding 08/27/2020   Rectal discharge 08/27/2020   Bloating 08/27/2020   Constipation 08/25/2020   Routine screening for STI (sexually transmitted infection) 04/22/2019   Encounter for long-term (current) use of high-risk medication 04/22/2019   Major depressive disorder, recurrent episode, mild (HCC) 10/13/2018   Human immunodeficiency virus (HIV) disease (HCC) 07/23/2018   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

## 2022-09-26 NOTE — ED Notes (Signed)
Patient refused his miralax and senkot states that he had a bowel movement.

## 2022-09-26 NOTE — ED Notes (Signed)
Patient awake complains of headache. Tylenol 650. Mg given

## 2022-09-26 NOTE — Group Note (Signed)
Group Topic: Healthy Self Image and Positive Change  Group Date: 09/26/2022 Start Time: 1110 End Time: 1200 Facilitators: Vonzell Schlatter B  Department: [facility]  Number of Participants: 7  Group Focus: feeling awareness/expression Treatment Modality:  Psychoeducation Interventions utilized were support Purpose: enhance coping skills  Name: Devon Hernandez Date of Birth: [birth year]  MR: [id number]    Level of Participation: N/A did not attend group Quality of Participation: did not attend group Interactions with others: N/A Mood/Affect: n/a Triggers (if applicable): N/A Cognition: N/A Progress: Other Response: N/A Plan: follow-up needed  Patients Problems:  Patient Active Problem List   Diagnosis Date Noted   Bipolar affective disorder, current episode depressed (HCC) 09/21/2022   Borderline personality disorder (HCC) 09/21/2022   Methamphetamine use disorder, moderate, in controlled environment (HCC) 09/21/2022   Cannabis use disorder, severe, in controlled environment, dependence (HCC) 09/21/2022   Schizoaffective disorder (HCC) 09/21/2022   Schizoaffective disorder, depressive type (HCC) 09/21/2022   Syphilis (acquired) 02/23/2022   Depression 03/31/2021   Rectal bleeding 08/27/2020   Rectal discharge 08/27/2020   Bloating 08/27/2020   Constipation 08/25/2020   Routine screening for STI (sexually transmitted infection) 04/22/2019   Encounter for long-term (current) use of high-risk medication 04/22/2019   Major depressive disorder, recurrent episode, mild (HCC) 10/13/2018   Human immunodeficiency virus (HIV) disease (HCC) 07/23/2018   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

## 2022-09-26 NOTE — ED Notes (Signed)
Patient requested pain medication for tooth ache.

## 2022-09-26 NOTE — ED Notes (Signed)
Patient observed/assessed in patient's room with patient lying in bed. Patient responded with minimal verbal stimulation. Eye contact appropriate and  affect is bright. Patient alert and oriented x 4. Patient states "Ready to go home."  Patient denies pain and anxiety. He denies A/V/H. He denies having any thoughts/plan of self harm and harm towards others. Fluid and snack offered. Patient states that appetite has been good throughout the day. Verbalizes no further complaints at this time. Will continue to monitor and support.

## 2022-09-26 NOTE — ED Notes (Signed)
Patient  sleeping in no acute stress. RR even and unlabored .Environment secured .Will continue to monitor for safely. 

## 2022-09-26 NOTE — ED Notes (Signed)
Patient alert and oriented x 3. Denies SI/HI/AVH. Denies intent or plan to harm self or others. Routine conducted according to faculty protocol. Encourage patient to notify staff with any needs or concerns. Patient verbalized agreement and understanding. Will continue to monitor for safety. 

## 2022-09-26 NOTE — Group Note (Signed)
Group Topic: Communication  Group Date: 09/26/2022 Start Time:  End Time: 1450 Facilitators: Merrie Roof, RN  Department: [facility]  Number of Participants: 4  Group Focus: coping skills Treatment Modality:  Leisure Development Interventions utilized were group exercise Purpose: enhance coping skills  Name: Devon Hernandez Date of Birth: [birth year]  MR: [id number]    Level of Participation:  Quality of Participation:  Interactions with others:  Mood/Affect:  Triggers (if applicable):  Cognition:  Progress:  Response:  Plan: Patient did not attend group.  Patients Problems:  Patient Active Problem List   Diagnosis Date Noted   Bipolar affective disorder, current episode depressed (HCC) 09/21/2022   Borderline personality disorder (HCC) 09/21/2022   Methamphetamine use disorder, moderate, in controlled environment (HCC) 09/21/2022   Cannabis use disorder, severe, in controlled environment, dependence (HCC) 09/21/2022   Schizoaffective disorder (HCC) 09/21/2022   Schizoaffective disorder, depressive type (HCC) 09/21/2022   Syphilis (acquired) 02/23/2022   Depression 03/31/2021   Rectal bleeding 08/27/2020   Rectal discharge 08/27/2020   Bloating 08/27/2020   Constipation 08/25/2020   Routine screening for STI (sexually transmitted infection) 04/22/2019   Encounter for long-term (current) use of high-risk medication 04/22/2019   Major depressive disorder, recurrent episode, mild (HCC) 10/13/2018   Human immunodeficiency virus (HIV) disease (HCC) 07/23/2018   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

## 2022-09-26 NOTE — ED Notes (Signed)
Patient observed/assessed in room in bed appearing in no immediate distress resting peacefully. Q15 minute checks continued by MHT and nursing staff. Will continue to monitor and support. 

## 2022-09-26 NOTE — Group Note (Signed)
Group Topic: Change and Accountability  Group Date: 09/25/2022 Start Time: 0730 End Time: 0830 Facilitators: Rae Lips B  Department: [facility]  Number of Participants: 4  Group Focus: acceptance Treatment Modality:  Exposure Therapy Interventions utilized were leisure development Purpose: increase insight  Name: Devon Hernandez Date of Birth: December 11, 1995  MR: [id number]    Level of Participation: Did not attend group Quality of Participation: NA Interactions with others: NA Mood/Affect: NA Triggers (if applicable): NA   Cognition: NA Progress: Other Response: NA Plan: patient will be encouraged to go to groups.   Patients Problems:  Patient Active Problem List   Diagnosis Date Noted   Bipolar affective disorder, current episode depressed (HCC) 09/21/2022   Borderline personality disorder (HCC) 09/21/2022   Methamphetamine use disorder, moderate, in controlled environment (HCC) 09/21/2022   Cannabis use disorder, severe, in controlled environment, dependence (HCC) 09/21/2022   Schizoaffective disorder (HCC) 09/21/2022   Schizoaffective disorder, depressive type (HCC) 09/21/2022   Syphilis (acquired) 02/23/2022   Depression 03/31/2021   Rectal bleeding 08/27/2020   Rectal discharge 08/27/2020   Bloating 08/27/2020   Constipation 08/25/2020   Routine screening for STI (sexually transmitted infection) 04/22/2019   Encounter for long-term (current) use of high-risk medication 04/22/2019   Major depressive disorder, recurrent episode, mild (HCC) 10/13/2018   Human immunodeficiency virus (HIV) disease (HCC) 07/23/2018   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

## 2022-09-26 NOTE — Group Note (Signed)
Group Topic: Relapse and Recovery  Group Date: 09/26/2022 Start Time: 0800 End Time: 0900 Facilitators: Rae Lips B  Department: [facility]  Number of Participants: 5  Group Focus: daily focus Treatment Modality:  Leisure Development Interventions utilized were leisure development Purpose: enhance coping skills, express feelings, and improve communication skills  Name: Devon Hernandez Date of Birth: October 10, 1995  MR: [id number]    Level of Participation: active Quality of Participation: attentive Interactions with others: gave feedback Mood/Affect: appropriate Triggers (if applicable): NA Cognition: coherent/clear Progress: Gaining insight Response: NA Plan: patient will be encouraged to keep going to groups  Patients Problems:  Patient Active Problem List   Diagnosis Date Noted   Bipolar affective disorder, current episode depressed (HCC) 09/21/2022   Borderline personality disorder (HCC) 09/21/2022   Methamphetamine use disorder, moderate, in controlled environment (HCC) 09/21/2022   Cannabis use disorder, severe, in controlled environment, dependence (HCC) 09/21/2022   Schizoaffective disorder (HCC) 09/21/2022   Schizoaffective disorder, depressive type (HCC) 09/21/2022   Syphilis (acquired) 02/23/2022   Depression 03/31/2021   Rectal bleeding 08/27/2020   Rectal discharge 08/27/2020   Bloating 08/27/2020   Constipation 08/25/2020   Routine screening for STI (sexually transmitted infection) 04/22/2019   Encounter for long-term (current) use of high-risk medication 04/22/2019   Major depressive disorder, recurrent episode, mild (HCC) 10/13/2018   Human immunodeficiency virus (HIV) disease (HCC) 07/23/2018   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

## 2022-09-26 NOTE — ED Notes (Signed)
Patient is outside wit safe. Environment is safe. Will continue to monitor for safety. 

## 2022-09-26 NOTE — ED Notes (Signed)
Patient resting quietly with eyes closed no s/s of distress, will continue to monitor for safety. 

## 2022-09-26 NOTE — ED Notes (Addendum)
Patient  sleeping in no acute stress. RR even and unlabored .Environment secured .Will continue to monitor for safely. 

## 2022-09-26 NOTE — ED Provider Notes (Signed)
Behavioral Health Progress Note  Date and Time: 09/26/2022 1:37 PM Name: Devon Hernandez MRN:  [id number]  Subjective:   Devon Hernandez is a 27 yr old male who presented on 5/22 to [facility] for detox from Meth.  PPHx is significant for Paranoid Schizophrenia, PTSD, ODD, Schizoaffective disorder, ADHD, borderline personality disorder, and Meth Abuse, and 4 Suicide Attempts and 3 Psychiatric Hospitalizations (last age 52).  PMHx is significant for HIV.  He reports no withdrawal symptoms today.  He reports no cravings today.  He reports that he is still having poor sleep but admits might be secondary to him sleeping during the daytime. Encouraged him to be active in daytime. Agreeable to starting melatonin for this as well.. Reports he is having normal BM this AM. He reports no SI, HI, or AVH. He reports appetite is good.  He reports no other concerns at present. No further questions at this time and feels ready for discharge tomorrow.  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 Past Medical History: HIV. Family History: Maternal Grandmother- Seizures, Aneurysm, Maternal Aunt- Diabetes, Mother- HTN Family Psychiatric  History: Mother- Depression, Father- Depression and PTSD Social History: has to return to court on 10/03/22 for larceny charges  Additional Social History:                         Sleep: Poor   Appetite:  Good  Current Medications:  Current Facility-Administered Medications  Medication Dose Route Frequency Provider Last Rate Last Admin   acetaminophen (TYLENOL) tablet 650 mg  650 mg  Oral Q6H PRN Rankin, Shuvon B, NP   650 mg at 09/26/22 1254   alum & mag hydroxide-simeth (MAALOX/MYLANTA) 200-200-20 MG/5ML suspension 30 mL  30 mL Oral Q4H PRN Rankin, Shuvon B, NP       benzocaine (ORAJEL) 10 % mucosal gel   Mouth/Throat BID PRN Park Pope, MD   Given at 09/25/22 1215   bictegravir-emtricitabine-tenofovir AF (BIKTARVY) 50-200-25 MG per tablet 1 tablet  1 tablet Oral Daily Rankin, Shuvon B, NP   1 tablet at 09/26/22 0923   hydrOXYzine (ATARAX) tablet 25 mg  25 mg Oral TID PRN Rankin, Shuvon B, NP   25 mg at 09/25/22 2136   ibuprofen (ADVIL) tablet 200 mg  200 mg Oral Q6H PRN Park Pope, MD   200 mg at 09/25/22 1212   magnesium hydroxide (MILK OF MAGNESIA) suspension 30 mL  30 mL Oral Daily PRN Rankin, Shuvon B, NP   30 mL at 09/25/22 0331   mirtazapine (REMERON SOL-TAB) disintegrating tablet 15 mg  15 mg Oral Seabron Spates, MD   15 mg at 09/25/22 2136   mupirocin ointment (BACTROBAN) 2 %   Topical BID Georgina Quint, MD       polyethylene glycol [facility] / Ethelene Hal) packet 17 g  17 g Oral Daily Park Pope, MD       senna-docusate (Senokot-S) tablet 1 tablet  1 tablet Oral Q1200 Park Pope, MD       traZODone (DESYREL) tablet 100 mg  100 mg Oral QHS PRN Lauro Franklin, MD   100 mg at 09/25/22 2136  Current Outpatient Medications  Medication Sig Dispense Refill   acetaminophen (TYLENOL) 500 MG tablet Take 1,000 mg by mouth every 6 (six) hours as needed (For tooth pain).     Aspirin-Acetaminophen-Caffeine (GOODYS EXTRA STRENGTH) 520-260-32.5 MG PACK Take 2 packets by mouth every 6 (six) hours as needed (For tooth pain).     BIKTARVY 50-200-25 MG TABS tablet TAKE 1 TABLET BY MOUTH 1 TIME A DAY. (Patient taking differently: 1 tablet daily.) 30 tablet 0   neomycin-bacitracin-polymyxin (NEOSPORIN) [phone] ointment Apply 1 Application topically 4 (four) times daily as needed (Apply to affected area).      Labs  Lab Results:  Admission on 09/21/2022  Component  Date Value Ref Range Status   HIV 1 RNA Quant 09/21/2022 59,800  copies/mL Corrected   Comment: (NOTE) The reportable range for this assay is 20 to 10,000,000 copies HIV-1 RNA/mL.    LOG10 HIV-1 RNA 09/21/2022 4.777  log10copy/mL Final   Comment: (NOTE) Performed At: [facility] [address] [place], [place] [id number] Jolene Schimke MD WU:[id number]    WBC 09/21/2022 9.3  4.0 - 10.5 K/uL Final   RBC 09/21/2022 4.63  4.22 - 5.81 MIL/uL Final   Hemoglobin 09/21/2022 13.6  13.0 - 17.0 g/dL Final   HCT 95/62/1308 41.0  39.0 - 52.0 % Final   MCV 09/21/2022 88.6  80.0 - 100.0 fL Final   MCH 09/21/2022 29.4  26.0 - 34.0 pg Final   MCHC 09/21/2022 33.2  30.0 - 36.0 g/dL Final   RDW 65/78/4696 12.8  11.5 - 15.5 % Final   Platelets 09/21/2022 275  150 - 400 K/uL Final   nRBC 09/21/2022 0.0  0.0 - 0.2 % Final   Neutrophils Relative % 09/21/2022 61  % Final   Neutro Abs 09/21/2022 5.6  1.7 - 7.7 K/uL Final   Lymphocytes Relative 09/21/2022 28  % Final   Lymphs Abs 09/21/2022 2.6  0.7 - 4.0 K/uL Final   Monocytes Relative 09/21/2022 10  % Final   Monocytes Absolute 09/21/2022 0.9  0.1 - 1.0 K/uL Final   Eosinophils Relative 09/21/2022 1  % Final   Eosinophils Absolute 09/21/2022 0.1  0.0 - 0.5 K/uL Final   Basophils Relative 09/21/2022 0  % Final   Basophils Absolute 09/21/2022 0.0  0.0 - 0.1 K/uL Final   Immature Granulocytes 09/21/2022 0  % Final   Abs Immature Granulocytes 09/21/2022 0.02  0.00 - 0.07 K/uL Final   Performed at [facility], [address], [place], [place] [address]   Sodium 09/21/2022 137  135 - 145 mmol/L Final   Potassium 09/21/2022 3.7  3.5 - 5.1 mmol/L Final   Chloride 09/21/2022 102  98 - 111 mmol/L Final   CO2 09/21/2022 24  22 - 32 mmol/L Final   Glucose, Bld 09/21/2022 96  70 - 99 mg/dL Final   Glucose reference range applies only to samples taken after fasting for at least 8 hours.   BUN 09/21/2022 13  6 - 20 mg/dL Final   Creatinine, Ser  09/21/2022 0.85  0.61 - 1.24 mg/dL Final   Calcium 41/32/4401 8.7 (L)  8.9 - 10.3 mg/dL Final   Total Protein 02/72/5366 6.9  6.5 - 8.1 g/dL Final   Albumin 44/06/4740 3.9  3.5 - 5.0 g/dL Final   AST 59/56/3875 23  15 - 41 U/L Final   ALT 09/21/2022 19  0 - 44 U/L Final   Alkaline Phosphatase 09/21/2022 70  38 - 126 U/L Final  Total Bilirubin 09/21/2022 0.8  0.3 - 1.2 mg/dL Final   GFR, Estimated 09/21/2022 >60  >60 mL/min Final   Comment: (NOTE) Calculated using the CKD-EPI Creatinine Equation (2021)    Anion gap 09/21/2022 11  5 - 15 Final   Performed at [facility], [address] [place], [place] [address]   Hgb A1c MFr Bld 09/21/2022 5.5  4.8 - 5.6 % Final   Comment: (NOTE) Pre diabetes:          5.7%-6.4%  Diabetes:              >6.4%  Glycemic control for   <7.0% adults with diabetes    Mean Plasma Glucose 09/21/2022 111.15  mg/dL Final   Performed at [facility], [address], [place], [place] [address]   Magnesium 09/21/2022 2.2  1.7 - 2.4 mg/dL Final   Performed at [facility], [address] [place], [place] [address]   Alcohol, Ethyl (B) 09/21/2022 <10  <10 mg/dL Final   Comment: (NOTE) Lowest detectable limit for serum alcohol is 10 mg/dL.  For medical purposes only. Performed at [facility], [address], [place], [place] [address]    Cholesterol 09/21/2022 124  0 - 200 mg/dL Final   Triglycerides 29/56/2130 84  <150 mg/dL Final   HDL 86/57/8469 39 (L)  >40 mg/dL Final   Total CHOL/HDL Ratio 09/21/2022 3.2  RATIO Final   VLDL 09/21/2022 17  0 - 40 mg/dL Final   LDL Cholesterol 09/21/2022 68  0 - 99 mg/dL Final   Comment:        Total Cholesterol/HDL:CHD Risk Coronary Heart Disease Risk Table                     Men   Women  1/2 Average Risk   3.4   3.3  Average Risk       5.0   4.4  2 X Average Risk   9.6   7.1  3 X Average Risk  23.4   11.0        Use the calculated Patient Ratio above and the CHD Risk Table to  determine the patient's CHD Risk.        ATP III CLASSIFICATION (LDL):  <100     mg/dL   Optimal  629-528  mg/dL   Near or Above                    Optimal  130-159  mg/dL   Borderline  413-244  mg/dL   High  >010     mg/dL   Very High Performed at [facility], [address] [place], [place] [address]    Prolactin 09/21/2022 9.7  3.6 - 31.5 ng/mL Final   Comment: (NOTE) Performed At: [facility] [address] [place], [place] [id number] Jolene Schimke MD QV:[id number]    TSH 09/21/2022 0.487  0.350 - 4.500 uIU/mL Final   Comment: Performed by a 3rd Generation assay with a functional sensitivity of <=0.01 uIU/mL. Performed at [facility], [address] [place], [place] [address]    Neisseria Gonorrhea 09/21/2022 Negative   Final   Chlamydia 09/21/2022 Negative   Final   Comment 09/21/2022 Normal Reference Ranger Chlamydia - Negative   Final   Comment 09/21/2022 Normal Reference Range Neisseria Gonorrhea - Negative   Final   Color, Urine 09/21/2022 YELLOW  YELLOW Final   APPearance  09/21/2022 CLEAR  CLEAR Final   Specific Gravity, Urine 09/21/2022 1.029  1.005 - 1.030 Final   pH 09/21/2022 6.0  5.0 - 8.0 Final   Glucose, UA 09/21/2022 NEGATIVE  NEGATIVE mg/dL Final   Hgb urine dipstick 09/21/2022 NEGATIVE  NEGATIVE Final   Bilirubin Urine 09/21/2022 NEGATIVE  NEGATIVE Final   Ketones, ur 09/21/2022 5 (A)  NEGATIVE mg/dL Final   Protein, ur 78/29/5621 NEGATIVE  NEGATIVE mg/dL Final   Nitrite 30/86/5784 NEGATIVE  NEGATIVE Final   Leukocytes,Ua 09/21/2022 NEGATIVE  NEGATIVE Final   Performed at [facility], [address] [place], [place] [address]   POC Amphetamine UR 09/21/2022 Positive (A)  NONE DETECTED (Cut Off Level 1000 ng/mL) Final   POC Secobarbital (BAR) 09/21/2022 None Detected  NONE DETECTED (Cut Off Level 300 ng/mL) Final   POC Buprenorphine (BUP) 09/21/2022 None Detected  NONE DETECTED (Cut Off Level 10 ng/mL) Final   POC Oxazepam  (BZO) 09/21/2022 Positive (A)  NONE DETECTED (Cut Off Level 300 ng/mL) Final   POC Cocaine UR 09/21/2022 None Detected  NONE DETECTED (Cut Off Level 300 ng/mL) Final   POC Methamphetamine UR 09/21/2022 Positive (A)  NONE DETECTED (Cut Off Level 1000 ng/mL) Final   POC Morphine 09/21/2022 None Detected (A)  NONE DETECTED (Cut Off Level 300 ng/mL) Final   POC Methadone UR 09/21/2022 None Detected (A)  NONE DETECTED (Cut Off Level 300 ng/mL) Corrected   POC Oxycodone UR 09/21/2022 None Detected  NONE DETECTED (Cut Off Level 100 ng/mL) Final   POC Marijuana UR 09/21/2022 Positive (A)  NONE DETECTED (Cut Off Level 50 ng/mL) Final   Absolute CD 4 Helper 09/21/2022 758  359 - 1,519 /uL Final   % CD 4 Pos. Lymph. 09/21/2022 36.1  30.8 - 58.5 % Final   CD3+CD8+ Cells # Bld 09/21/2022 613  109 - 897 /uL Final   CD3+CD8+ Cells NFr Bld 09/21/2022 29.2  12.0 - 35.5 % Final   CD3+CD4+ Cells/CD3+CD8+ Cells Bld 09/21/2022 1.24  0.92 - 3.72 Final   WBC 09/21/2022 10.7  3.4 - 10.8 x10E3/uL Final   RBC 09/21/2022 4.50  4.14 - 5.80 x10E6/uL Final   Hemoglobin 09/21/2022 13.5  13.0 - 17.7 g/dL Final   Hematocrit 52/84/1324 39.4  37.5 - 51.0 % Final   MCV 09/21/2022 88  79 - 97 fL Final   MCH 09/21/2022 30.0  26.6 - 33.0 pg Final   MCHC 09/21/2022 34.3  31.5 - 35.7 g/dL Final   RDW 40/01/2724 12.2  11.6 - 15.4 % Final   Platelets 09/21/2022 252  150 - 450 x10E3/uL Final   Neutrophils 09/21/2022 71  Not Estab. % Final   Lymphs 09/21/2022 19  Not Estab. % Final   Monocytes 09/21/2022 9  Not Estab. % Final   Eos 09/21/2022 1  Not Estab. % Final   Basos 09/21/2022 0  Not Estab. % Final   Neutrophils Absolute 09/21/2022 7.5 (H)  1.4 - 7.0 x10E3/uL Final   Lymphocytes Absolute 09/21/2022 2.1  0.7 - 3.1 x10E3/uL Final   Monocytes Absolute 09/21/2022 0.9  0.1 - 0.9 x10E3/uL Final   EOS (ABSOLUTE) 09/21/2022 0.1  0.0 - 0.4 x10E3/uL Final   Basophils Absolute 09/21/2022 0.0  0.0 - 0.2 x10E3/uL Final   Immature  Granulocytes 09/21/2022 0  Not Estab. % Final   Immature Grans (Abs) 09/21/2022 0.0  0.0 - 0.1 x10E3/uL Final   Comment: (NOTE) Performed At: [facility] [address] [place], [place] [id number]  Jolene Schimke MD WU:[id number]     Blood Alcohol level:  Lab Results  Component Value Date   ETH <10 09/21/2022   ETH <10 10/13/2018    Metabolic Disorder Labs: Lab Results  Component Value Date   HGBA1C 5.5 09/21/2022   MPG 111.15 09/21/2022   MPG 117 (H) 01/19/2013   Lab Results  Component Value Date   PROLACTIN 9.7 09/21/2022   Lab Results  Component Value Date   CHOL 124 09/21/2022   TRIG 84 09/21/2022   HDL 39 (L) 09/21/2022   CHOLHDL 3.2 09/21/2022   VLDL 17 09/21/2022   LDLCALC 68 09/21/2022   LDLCALC 66 12/15/2021    Therapeutic Lab Levels: No results found for: "LITHIUM" No results found for: "VALPROATE" No results found for: "CBMZ"  Physical Findings   AUDIT    Flowsheet Row [facility]) [facility] Admission (Discharged[facility]  Alcohol Use Disorder Identification Test Final Score (AUDIT) 0 0      PHQ2-9    Flowsheet Row ED from 09/21/2022 in [facility] Office Visit from 02/23/2022 in [facility]ious [facility] Office Visit from 03/31/2021 in [facility] for Infectious Disease Office Visit from 08/25/2020 in [facility] for Infectious Disease Office Visit from 02/12/2020 in [facility] for Infectious Disease  PHQ-2 Total Score 0 0 3 0 6  PHQ-9 Total Score 4 -- 21 -- 21      Flowsheet Row ED from 09/21/2022 in [facility] Most recent reading at 09/21/2022  2:42 PM ED from 09/21/2022 in [facility] Most recent reading at 09/21/2022  9:22 AM ED from 09/20/2022 in [facility] Most recent reading at 09/20/2022  6:34 PM  C-SSRS RISK CATEGORY No Risk No Risk No Risk        Musculoskeletal  Strength & Muscle Tone: within normal limits Gait & Station: normal Patient leans: N/A  Psychiatric Specialty Exam  Presentation  General Appearance:  Appropriate for Environment; Casual  Eye Contact: Good  Speech: Clear and Coherent; Normal Rate  Speech Volume: Normal  Handedness: Right   Mood and Affect  Mood: -- ("ok")  Affect: Congruent; Appropriate   Thought Process  Thought Processes: Coherent; Goal Directed  Descriptions of Associations:Intact  Orientation:Full (Time, Place and Person)  Thought Content:Logical; WDL  Diagnosis of Schizophrenia or Schizoaffective disorder in past: Yes  Duration of Psychotic Symptoms: Greater than six months   Hallucinations:Hallucinations: None  Ideas of Reference:None  Suicidal Thoughts:Suicidal Thoughts: No  Homicidal Thoughts:Homicidal Thoughts: No   Sensorium  Memory: Immediate Fair; Recent Fair  Judgment: Good  Insight: Good   Executive Functions  Concentration: Good  Attention Span: Good  Recall: Good  Fund of Knowledge: Good  Language: Good   Psychomotor Activity  Psychomotor Activity: Psychomotor Activity: Normal   Assets  Assets: Communication Skills; Desire for Improvement; Resilience; Physical Health   Sleep  Sleep: Sleep: Poor   No data recorded  Physical Exam  Physical Exam Vitals and nursing note reviewed.  Constitutional:      General: He is not in acute distress.    Appearance: Normal appearance. He is normal weight. He is not ill-appearing or toxic-appearing.  HENT:     Head: Normocephalic and atraumatic.  Pulmonary:     Effort: Pulmonary effort is normal.  Musculoskeletal:  General: Normal range of motion.  Neurological:     General: No focal deficit present.     Mental Status: He is alert.     Review of Systems  Respiratory:  Negative for cough and shortness of breath.   Cardiovascular:  Negative for chest pain.  Gastrointestinal:  Negative for abdominal pain, diarrhea, nausea and vomiting.  Neurological:  Negative for dizziness, weakness and headaches.  Psychiatric/Behavioral:  Negative for depression, hallucinations and suicidal ideas. The patient is not nervous/anxious.    Blood pressure 117/84, pulse 99, temperature 98.4 F (36.9 C), temperature source Oral, resp. rate 17, SpO2 100 %. There is no height or weight on file to calculate BMI.  Treatment Plan Summary: Daily contact with patient to assess and evaluate symptoms and progress in treatment and Medication management  Devon Hernandez is a 27 yr old male who presented on 5/22 to [facility] for detox from Meth.  PPHx is significant for Paranoid Schizophrenia, PTSD, ODD, Schizoaffective disorder, ADHD, borderline personality disorder, and Meth Abuse, and 4 Suicide Attempts and 3 Psychiatric Hospitalizations (last age 52).  PMHx is significant for HIV.   Devon Hernandez is having no withdrawal symptoms.  He received his second loading dose of [name] this morning without issue.  He continues to have issues with sleep so we will increase his PRN Trazodone.  He has been having constipation so a one-time dose of  Senokot will be ordered.  If he does not have a BM tomorrow may need further treatment.  We will not make any other changes to his medications at this time.  We will continue to monitor.   Stimulant use Disorder Hx of Schizoaffective Disorder, Depressive Type: -Received Invega Sustenna 234 mg IM on 09/21/2022 -Received Invega Sustenna 156 mg IM today -Continue Remeron SOL-TAB 15 mg QHS  Constipation, resolved -Scheduled miralax and senokot x 7 days   HIV: -Continue Biktarvy 50-200-25 mg  -Continue benzocaine 10% BID PRN -Continue PRN's: Tylenol, Maalox, Atarax, Milk of Magnesia, Ibuprofen -Increase PRN Trazodone to  100 mg QHS   Dispo: Home on 5/28    Park Pope, MD 09/26/2022 1:37 PM

## 2022-09-27 DIAGNOSIS — F1520 Other stimulant dependence, uncomplicated: Secondary | ICD-10-CM | POA: Diagnosis not present

## 2022-09-27 MED ORDER — SENNOSIDES-DOCUSATE SODIUM 8.6-50 MG PO TABS: 1.0000 | ORAL_TABLET | Freq: Every day | ORAL | 0 refills | Status: DC

## 2022-09-27 MED ORDER — POLYETHYLENE GLYCOL 3350 17 G PO PACK: 17.0000 g | PACK | Freq: Every day | ORAL | 0 refills | Status: DC

## 2022-09-27 MED ORDER — HYDROXYZINE HCL 25 MG PO TABS: 25.0000 mg | ORAL_TABLET | Freq: Three times a day (TID) | ORAL | 0 refills | Status: DC | PRN

## 2022-09-27 MED ORDER — INVEGA SUSTENNA 156 MG/ML IM SUSY: 156.0000 mg | PREFILLED_SYRINGE | Freq: Once | INTRAMUSCULAR | 0 refills | Status: DC

## 2022-09-27 MED ORDER — MIRTAZAPINE 15 MG PO TBDP: 15.0000 mg | ORAL_TABLET | Freq: Every day | ORAL | 0 refills | Status: DC

## 2022-09-27 MED ORDER — TRAZODONE HCL 100 MG PO TABS: 100.0000 mg | ORAL_TABLET | Freq: Every evening | ORAL | 0 refills | Status: DC | PRN

## 2022-09-27 NOTE — ED Provider Notes (Addendum)
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  Feels ready to be discharged back home.  Denies any acute somatic complaints at this time.  Reports minimal pain from invega sustenna at this time.   Stay Summary by Problem List Devon Hernandez is a 27 yr old male who presented on 5/22 to [facility] for detox from Meth. PPHx is significant for Paranoid Schizophrenia, PTSD, ODD, Schizoaffective disorder, ADHD, borderline personality disorder, and Meth Abuse, and 4 Suicide Attempts and 3 Psychiatric Hospitalizations (last age 21). PMHx is significant for HIV.   Schizoaffective disorder, depressive type Methamphetamine use disorder Psychoactive substance induced psychosis Patient presented with active hallucinations in the setting of recent methamphetamine use.  It is unclear whether this was due to patient's known history of schizoaffective disorder or was substance-induced primarily.  Patient was given Gean Birchwood as he had previously been successful with risperidone and has a known history of medication noncompliance.  Patient's psychosis and mood gradually improved and patient began to participate in milieu more actively.  Patient reported that psychosis had largely resolved by time of discharge.  Patient was given both loading doses of Invega Sustenna (234 mg on 09/21/22 and 156 mg on 09/25/22).  Patient briefly had problems with constipation.  Patient was started on Senokot and MiraLAX for 7-day course.  Patient's depressive symptoms were addressed with mirtazapine given he had poor p.o. intake as well likely in the context of methamphetamine use.  Past Psychiatric History: Paranoid  Schizophrenia, PTSD, ODD, Schizoaffective disorder, ADHD, borderline personality disorder, and Meth Abuse, and 4 Suicide Attempts and 3 Psychiatric Hospitalizations (last age 89). Past Medical History: HIV. Family History: Maternal Grandmother- Seizures, Aneurysm, Maternal Aunt- Diabetes, Mother- HTN Family Psychiatric  History: Mother- Depression, Father- Depression and PTSD Social History: has to return to court on 10/03/22 for larceny charges   Total Time spent with patient: 30 minutes   Past Medical History:  Past Medical History:  Diagnosis Date   ADHD (attention deficit hyperactivity disorder)    Allergy    Anxiety    Bipolar disorder (HCC)    Depression    Headache(784.0)    HIV (human immunodeficiency virus infection) (HCC) 07/05/2018   Immune deficiency disorder (HCC)    Rectal abscess     Past Surgical History:  Procedure Laterality Date   TYMPANOSTOMY TUBE PLACEMENT     Family History:  Family History  Problem Relation Age of Onset   Depression Mother    Hypertension Mother    Depression Father        Hx: PTSD   Diabetes Maternal Aunt    AVM Maternal Grandmother    Aneurysm Maternal Grandmother    Seizures Maternal Grandmother    Stomach cancer Neg Hx    Pancreatic cancer Neg Hx    Colon cancer Neg Hx    Liver disease Neg Hx    Esophageal cancer Neg Hx    Rectal cancer Neg Hx     Social History:  Social History   Substance and Sexual Activity  Alcohol Use Yes   Alcohol/week: 6.0 standard drinks of alcohol   Types: 6 Standard drinks or equivalent per week     Social History   Substance and  Sexual Activity  Drug Use Yes   Types: Marijuana    Social History   Socioeconomic History   Marital status: Single    Spouse name: Not on file   Number of children: Not on file   Years of education: Not on file   Highest education level: Not on file  Occupational History   Not on file  Tobacco Use   Smoking status: Every Day    Types: Cigars    Smokeless tobacco: Never   Tobacco comments:    smokes every couple of months    Vaping Use   Vaping Use: Every day  Substance and Sexual Activity   Alcohol use: Yes    Alcohol/week: 6.0 standard drinks of alcohol    Types: 6 Standard drinks or equivalent per week   Drug use: Yes    Types: Marijuana   Sexual activity: Yes    Comment: given condoms   Other Topics Concern   Not on file  Social History Narrative   Mom currently has full custody but [name] has gone to live with his dad. Tonight was the first night that he was at dad's house. He admitted to marijuana use tonight, unclear when. Smoke exposure at home. No pets at home.   Social Determinants of Health   Financial Resource Strain: Not on file  Food Insecurity: Not on file  Transportation Needs: Not on file  Physical Activity: Not on file  Stress: Not on file  Social Connections: Not on file   SDOH:  SDOH Screenings   Depression (PHQ2-9): Low Risk  (09/24/2022)  Recent Concern: Depression (PHQ2-9) - High Risk (09/21/2022)  Tobacco Use: High Risk (09/25/2022)    Tobacco Cessation:  N/A, patient does not currently use tobacco products  Current Medications:  Current Facility-Administered Medications  Medication Dose Route Frequency Provider Last Rate Last Admin   acetaminophen (TYLENOL) tablet 650 mg  650 mg Oral Q6H PRN Rankin, Shuvon B, NP   650 mg at 09/27/22 0119   alum & mag hydroxide-simeth (MAALOX/MYLANTA) 200-200-20 MG/5ML suspension 30 mL  30 mL Oral Q4H PRN Rankin, Shuvon B, NP       benzocaine (ORAJEL) 10 % mucosal gel   Mouth/Throat BID PRN Park Pope, MD   Given at 09/25/22 1215   bictegravir-emtricitabine-tenofovir AF (BIKTARVY) 50-200-25 MG per tablet 1 tablet  1 tablet Oral Daily Rankin, Shuvon B, NP   1 tablet at 09/27/22 0914   hydrOXYzine (ATARAX) tablet 25 mg  25 mg Oral TID PRN Rankin, Shuvon B, NP   25 mg at 09/26/22 2135   ibuprofen (ADVIL) tablet 200 mg  200 mg Oral Q6H PRN Park Pope, MD   200 mg  at 09/25/22 1212   magnesium hydroxide (MILK OF MAGNESIA) suspension 30 mL  30 mL Oral Daily PRN Rankin, Shuvon B, NP   30 mL at 09/25/22 0331   mirtazapine (REMERON SOL-TAB) disintegrating tablet 15 mg  15 mg Oral Seabron Spates, MD   15 mg at 09/26/22 2135   mupirocin ointment (BACTROBAN) 2 %   Topical BID Georgina Quint, MD       polyethylene glycol [facility]) packet 17 g  17 g Oral Daily Park Pope, MD       senna-docusate (Senokot-S) tablet 1 tablet  1 tablet Oral Q1200 Park Pope, MD   1 tablet at 09/27/22 0914   traZODone (DESYREL) tablet 100 mg  100 mg Oral QHS PRN Lauro Franklin, MD   100 mg  at 09/26/22 2134   Current Outpatient Medications  Medication Sig Dispense Refill   acetaminophen (TYLENOL) 500 MG tablet Take 1,000 mg by mouth every 6 (six) hours as needed (For tooth pain).     BIKTARVY 50-200-25 MG TABS tablet TAKE 1 TABLET BY MOUTH 1 TIME A DAY. (Patient taking differently: 1 tablet daily.) 30 tablet 0   neomycin-bacitracin-polymyxin (NEOSPORIN) [id number] ointment Apply 1 Application topically 4 (four) times daily as needed (Apply to affected area).     hydrOXYzine (ATARAX) 25 MG tablet Take 1 tablet (25 mg total) by mouth 3 (three) times daily as needed for anxiety. 30 tablet 0   mirtazapine (REMERON SOL-TAB) 15 MG disintegrating tablet Take 1 tablet (15 mg total) by mouth at bedtime. 30 tablet 0   [START ON 10/23/2022] paliperidone (INVEGA SUSTENNA) 156 MG/ML SUSY injection Inject 1 mL (156 mg total) into the muscle once for 1 dose. 1 mL 0   [START ON 09/28/2022] polyethylene glycol (MIRALAX / GLYCOLAX) 17 g packet Take 17 g by mouth daily. 14 each 0   [START ON 09/28/2022] senna-docusate (SENOKOT-S) 8.6-50 MG tablet Take 1 tablet by mouth daily at 12 noon. 7 tablet 0   traZODone (DESYREL) 100 MG tablet Take 1 tablet (100 mg total) by mouth at bedtime as needed and may repeat dose one time if needed for sleep. 45 tablet 0    PTA Medications: (Not in a  hospital admission)       09/27/2022    9:40 AM 09/24/2022    8:32 AM 09/21/2022    3:40 PM  Depression screen PHQ 2/9  Decreased Interest 0 0 1  Down, Depressed, Hopeless 0 0 1  PHQ - 2 Score 0 0 2  Altered sleeping 0 2 3  Tired, decreased energy 0 2 3  Change in appetite 0 0 3  Feeling bad or failure about yourself  0 0 3  Trouble concentrating 0 0 3  Moving slowly or fidgety/restless 0 0 1  Suicidal thoughts 0 0 0  PHQ-9 Score 0 4 18  Difficult doing work/chores  Somewhat difficult     Flowsheet Row ED from 09/21/2022 in [facility] Most recent reading at 09/21/2022  2:42 PM ED from 09/21/2022 in [facility] Most recent reading at 09/21/2022  9:22 AM ED from 09/20/2022 in [facility] Most recent reading at 09/20/2022  6:34 PM  C-SSRS RISK CATEGORY No Risk No Risk No Risk       Musculoskeletal  Strength & Muscle Tone: within normal limits Gait & Station: normal Patient leans: N/A  Psychiatric Specialty Exam  Presentation  General Appearance:  Appropriate for Environment; Casual   Eye Contact: Good   Speech: Clear and Coherent; Normal Rate   Speech Volume: Normal   Handedness: Right    Mood and Affect  Mood: Euthymic   Affect: Appropriate; Congruent    Thought Process  Thought Processes: Coherent; Goal Directed; Linear   Descriptions of Associations:Intact   Orientation:Full (Time, Place and Person)   Thought Content:Logical      Hallucinations:Hallucinations: None   Ideas of Reference:None   Suicidal Thoughts:Suicidal Thoughts: No   Homicidal Thoughts:Homicidal Thoughts: No    Sensorium  Memory: Immediate Good; Recent Good; Remote Good   Judgment: Fair   Insight: Fair    Executive Functions  Concentration: Good   Attention Span: Good   Recall: Good   Fund of Knowledge: Good   Language: Good  Psychomotor  Activity  Psychomotor Activity:Psychomotor Activity: Normal    Assets  Assets: Communication Skills; Desire for Improvement; Physical Health    Sleep  Sleep:Sleep: Good    No data recorded   Physical Exam  Physical Exam ROS Blood pressure (!) 124/101, pulse 95, temperature 98.4 F (36.9 C), temperature source Oral, resp. rate 18, SpO2 100 %. There is no height or weight on file to calculate BMI.  Demographic Factors:  Male  Loss Factors: Legal issues  Historical Factors: Impulsivity  Risk Reduction Factors:   Living with another person, especially a relative, Positive social support, Positive therapeutic relationship, and Positive coping skills or problem solving skills  Continued Clinical Symptoms:  Alcohol/Substance Abuse/Dependencies More than one psychiatric diagnosis Previous Psychiatric Diagnoses and Treatments  Cognitive Features That Contribute To Risk:  None    Suicide Risk:  Mild:  There are no identifiable plans, no associated intent, mild dysphoria and related symptoms, good self-control (both objective and subjective assessment), few other risk factors, and identifiable protective factors, including available and accessible social support.  Plan Of Care/Follow-up recommendations:   Follow-up recommendations:   Activity:  as tolerated Diet:  heart healthy   Comments:  Prescriptions were given at discharge.  Patient is agreeable with the discharge plan.  Patient was given an opportunity to ask questions.  Patient appears to feel comfortable with discharge and denies any current suicidal or homicidal thoughts.    Patient is instructed prior to discharge to: Take all medications as prescribed by mental healthcare provider. Report any adverse effects and or reactions from the medicines to outpatient provider promptly. In the event of worsening symptoms, patient is instructed to call the crisis hotline, 911 and or go to the nearest ED for appropriate  evaluation and treatment of symptoms. Patient is to follow-up with primary care provider for other medical issues, concerns and or health care needs.   Park Pope, MD 09/27/2022, 10:57 AM

## 2022-09-27 NOTE — ED Notes (Signed)
Patient observed/assessed in room in bed appearing in no immediate distress resting peacefully. Q15 minute checks continued by MHT and nursing staff. Will continue to monitor and support. 

## 2022-09-27 NOTE — Group Note (Signed)
Group Topic: Healthy Self Image and Positive Change  Group Date: 09/27/2022 Start Time: 1610 End Time: 1100 Facilitators: Vonzell Schlatter B  Department: [facility]  Number of Participants: 6  Group Focus: goals/reality orientation Treatment Modality:  Psychoeducation Interventions utilized were support Purpose: regain self-worth  Name: Devon Hernandez Date of Birth: Jul 22, 1995  MR: [id number]    Level of Participation: patient did not attend group Quality of Participation: n/a Interactions with others: n/a Mood/Affect: n/a Triggers (if applicable): n/a Cognition: n/a Progress: Other Response: n/a Plan: follow-up needed  Patients Problems:  Patient Active Problem List   Diagnosis Date Noted   Bipolar affective disorder, current episode depressed (HCC) 09/21/2022   Borderline personality disorder (HCC) 09/21/2022   Methamphetamine use disorder, moderate, in controlled environment (HCC) 09/21/2022   Cannabis use disorder, severe, in controlled environment, dependence (HCC) 09/21/2022   Schizoaffective disorder (HCC) 09/21/2022   Schizoaffective disorder, depressive type (HCC) 09/21/2022   Syphilis (acquired) 02/23/2022   Depression 03/31/2021   Rectal bleeding 08/27/2020   Rectal discharge 08/27/2020   Bloating 08/27/2020   Constipation 08/25/2020   Routine screening for STI (sexually transmitted infection) 04/22/2019   Encounter for long-term (current) use of high-risk medication 04/22/2019   Major depressive disorder, recurrent episode, mild (HCC) 10/13/2018   Human immunodeficiency virus (HIV) disease (HCC) 07/23/2018   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

## 2022-09-27 NOTE — Discharge Planning (Signed)
LCSW spoke with patient on this morning who reports he will be discharging back home with his parents on today with transportation provided by his stepfather. Patient is aware of appointment scheduled for Thursday 05/30 at 10:00am at the [facility]. Patient expressed appreciation for LCSW assistance and support during this time. Patient reports his plan is to be more watchful of his surroundings and start putting himself first. Brief supportive counseling was provided to the patient and patient was receptive to the feedback provided. No other needs were reported at this time. Patient reports his plan is to have transportation arranged for lunch time.   LCSW to sign off at this time. Please inform if further LCSW needs arise prior to discharge.   Fernande Boyden, LCSW Clinical Social Worker [facility] Ph: [phone]

## 2022-09-30 ENCOUNTER — Other Ambulatory Visit: Payer: Self-pay

## 2022-09-30 ENCOUNTER — Encounter: Payer: Self-pay | Admitting: Internal Medicine

## 2022-09-30 ENCOUNTER — Ambulatory Visit (INDEPENDENT_AMBULATORY_CARE_PROVIDER_SITE_OTHER): Payer: Managed Care, Other (non HMO) | Admitting: Internal Medicine

## 2022-09-30 VITALS — BP 130/81 | HR 139 | Temp 97.1°F | Ht 70.0 in | Wt 129.0 lb

## 2022-09-30 DIAGNOSIS — B20 Human immunodeficiency virus [HIV] disease: Secondary | ICD-10-CM

## 2022-09-30 NOTE — Progress Notes (Unsigned)
   Subjective:    Patient ID: Devon Hernandez, male    DOB: February 03, 1996, 27 y.o.   MRN: [id number]  HPI Devon Hernandez is here for follow up of HIV Since his last visit he has experienced significant events.  He became addicted to methamphetamines and since then has been in jail, behavioral health hospital and detox.  He is here to get back into care and get straightened out.  He is remorseful.  He has been back on Biktarvy after detox from meth inpatient.    Review of Systems  Constitutional:  Negative for fatigue.  Gastrointestinal:  Negative for diarrhea and nausea.  Skin:  Negative for rash.       Objective:   Physical Exam Eyes:     General: No scleral icterus. Pulmonary:     Effort: Pulmonary effort is normal.  Neurological:     Mental Status: He is alert.   SH: + tobacco        Assessment & Plan:

## 2022-10-04 ENCOUNTER — Encounter: Payer: Self-pay | Admitting: Internal Medicine

## 2022-10-04 MED ORDER — BIKTARVY 50-200-25 MG PO TABS: 1.0000 | ORAL_TABLET | Freq: Every day | ORAL | 11 refills | Status: DC

## 2022-10-04 NOTE — Assessment & Plan Note (Signed)
He is back on medication and doing much better.  I had a long discussion with him regarding his recent issues, hospitalization and detox and path forward.  Will have him recheck a viral load in about 1 month to see how he is doing back on medicaiton.   Refills provided.   I have personally spent 42 minutes involved in face-to-face and non-face-to-face activities for this patient on the day of the visit. Professional time spent includes the following activities: Preparing to see the patient (review of tests), Obtaining and/or reviewing separately obtained history (admission/discharge record), Performing a medically appropriate examination and/or evaluation , Ordering medications/tests/procedures, referring and communicating with other health care professionals, Documenting clinical information in the EMR, Independently interpreting results (not separately reported), Communicating results to the patient, Counseling and educating the patient and Care coordination (not separately reported).

## 2022-10-05 ENCOUNTER — Other Ambulatory Visit: Payer: Self-pay | Admitting: Internal Medicine

## 2022-10-05 DIAGNOSIS — B20 Human immunodeficiency virus [HIV] disease: Secondary | ICD-10-CM

## 2022-10-06 ENCOUNTER — Ambulatory Visit (HOSPITAL_COMMUNITY): Payer: 59 | Admitting: Psychiatry

## 2022-10-13 ENCOUNTER — Encounter (HOSPITAL_COMMUNITY): Payer: Self-pay | Admitting: Psychiatry

## 2022-10-13 ENCOUNTER — Ambulatory Visit (INDEPENDENT_AMBULATORY_CARE_PROVIDER_SITE_OTHER): Payer: 59 | Admitting: Psychiatry

## 2022-10-13 VITALS — BP 131/83 | HR 114 | Temp 98.7°F | Wt 135.0 lb

## 2022-10-13 DIAGNOSIS — F251 Schizoaffective disorder, depressive type: Secondary | ICD-10-CM | POA: Diagnosis not present

## 2022-10-13 DIAGNOSIS — F17200 Nicotine dependence, unspecified, uncomplicated: Secondary | ICD-10-CM | POA: Diagnosis not present

## 2022-10-13 MED ORDER — MIRTAZAPINE 15 MG PO TBDP: 15.0000 mg | ORAL_TABLET | Freq: Every day | ORAL | 3 refills | Status: DC

## 2022-10-13 MED ORDER — TRAZODONE HCL 100 MG PO TABS
100.0000 mg | ORAL_TABLET | Freq: Every evening | ORAL | 3 refills | Status: DC | PRN
Start: 2022-10-13 — End: 2023-01-19

## 2022-10-13 MED ORDER — NICOTINE 21 MG/24HR TD PT24
21.0000 mg | MEDICATED_PATCH | Freq: Every day | TRANSDERMAL | 3 refills | Status: DC
Start: 2022-10-13 — End: 2023-01-19

## 2022-10-13 MED ORDER — INVEGA SUSTENNA 156 MG/ML IM SUSY
156.0000 mg | PREFILLED_SYRINGE | INTRAMUSCULAR | 11 refills | Status: DC
Start: 2022-10-13 — End: 2022-12-20

## 2022-10-13 MED ORDER — PALIPERIDONE PALMITATE ER 156 MG/ML IM SUSY
156.0000 mg | PREFILLED_SYRINGE | INTRAMUSCULAR | Status: DC
Start: 2022-10-27 — End: 2023-09-13
  Administered 2022-10-27 – 2023-08-17 (×11): 156 mg via INTRAMUSCULAR

## 2022-10-13 MED ORDER — SENNOSIDES-DOCUSATE SODIUM 8.6-50 MG PO TABS
1.0000 | ORAL_TABLET | Freq: Every day | ORAL | 3 refills | Status: DC
Start: 2022-10-13 — End: 2023-08-03

## 2022-10-13 NOTE — Progress Notes (Signed)
Psychiatric Initial Adult Assessment   Patient Identification: Devon Hernandez MRN:  [id number] Date of Evaluation:  10/13/2022 Referral Source: [facility] Chief Complaint:  "I'm a little nervous" Visit Diagnosis:    ICD-10-CM   1. Schizoaffective disorder, depressive type (HCC)  F25.1 mirtazapine (REMERON SOL-TAB) 15 MG disintegrating tablet    paliperidone (INVEGA SUSTENNA) 156 MG/ML SUSY injection    senna-docusate (SENOKOT-S) 8.6-50 MG tablet    traZODone (DESYREL) 100 MG tablet      History of Present Illness: 27 year old male seen today for initial psychiatric evaluation.  He was referred to outpatient psychiatry by [facility] where he presented on 09/21/2022 through 09/25/2022 requesting medication management and detox from methamphetamines.  He has a psychiatric history of  Paranoid Schizophrenia, PTSD, ODD, Schizoaffective disorder, ADHD, borderline personality disorder, Meth Abuse, Suicide Attempts and 3 Psychiatric Hospitalizations (last age 30 prior to his admission at [facility] on 09/21/2022).  Patient received his loading dose of Invega Sustenna 234 on 09/21/2022.  He then received his maintenance dose of 156 mg of Invega on 5/26.  He informed Clinical research associate that his medications are somewhat effective in managing his psychiatric conditions.  Today he was well-groomed, pleasant, cooperative, and engaged in conversation.  He informed Clinical research associate that he is somewhat anxious.  He notes that this is a new environment for him and reports that he was uncertain what would happen at his appointment.  Since his discharge patient notes that his mood has been more stable.  He did Archivist that he relapsed on methamphetamines after he left [facility].  He notes that he has now been sober for 3 weeks and has been attending substance use treatment/the[facility].  Patient informed writer that his anxiety, mood, and depression are better managed.  Today provider conducted GAD-7 and patient scored a 2.  Provider  also conducted a PHQ-9 and patient scored a 2.  He endorses sleeping 6 hours nightly and reports that his appetite has increased.  Patient notes that since his hospitalization he has gained 10 pounds which he is pleased with.  Patient informed Clinical research associate that he smokes Black and milds 3 times daily.  He also informed Clinical research associate that he drinks socially.  He denies other illegal drug use.  Provider asked patient if he would be interested in a NicoDerm patch and he reports that he was.   Patient reports that he is doing well on his current medication regimen.  He was rescheduled for next injection on 10/27/2022.  He will receive Invega Sustenna 156 mg monthly.  Patient also informed writer that she vapes daily and is interested in NicoDerm patch.  Today NicoDerm CQ 21 mg patches ordered.  Patient informed Clinical research associate that he has been constipated and notes that senna is somewhat effective in managing his constipation.  Provider encouraged patient to take MiraLAX over-the-counter daily.  He endorsed understanding and agreed.  He will continue all other medications as prescribed.  He will follow-up with counseling at the [facility].  No other concerns noted at this time. Associated Signs/Symptoms: Depression Symptoms:  loss of energy/fatigue, weight gain, increased appetite, (Hypo) Manic Symptoms:   Denies Anxiety Symptoms:   Mild anxiety Psychotic Symptoms:   Denies PTSD Symptoms: Had a traumatic exposure:  Did not want to discuss  Past Psychiatric History: Paranoid Schizophrenia, PTSD, ODD, Schizoaffective disorder, ADHD, borderline personality disorder, and Meth Abuse, and 4 Suicide Attempts and 3 Psychiatric Hospitalizations (last age 54 prior to his admission at [facility] on 09/21/2022)   Previous  Psychotropic Medications:  Trazodone, Invega, Risperdal, mirtazapine, Abilify, guanfacine, Wellbutrin, hydroxyzine, Lamictal, and lithium  Substance Abuse History in the last 12 months:  Yes.    Consequences of Substance  Abuse: Medical Consequences:  Hospitalized on 06/23/2022 requesting detox from methamphetamine  Past Medical History:  Past Medical History:  Diagnosis Date   ADHD (attention deficit hyperactivity disorder)    Allergy    Anxiety    Bipolar disorder (HCC)    Depression    Headache(784.0)    HIV (human immunodeficiency virus infection) (HCC) 07/05/2018   Immune deficiency disorder (HCC)    Rectal abscess     Past Surgical History:  Procedure Laterality Date   TYMPANOSTOMY TUBE PLACEMENT      Family Psychiatric History: Mother- Depression, Father- Depression and PTSD   Family History:  Family History  Problem Relation Age of Onset   Depression Mother    Hypertension Mother    Depression Father        Hx: PTSD   Diabetes Maternal Aunt    AVM Maternal Grandmother    Aneurysm Maternal Grandmother    Seizures Maternal Grandmother    Stomach cancer Neg Hx    Pancreatic cancer Neg Hx    Colon cancer Neg Hx    Liver disease Neg Hx    Esophageal cancer Neg Hx    Rectal cancer Neg Hx     Social History:   Social History   Socioeconomic History   Marital status: Single    Spouse name: Not on file   Number of children: Not on file   Years of education: Not on file   Highest education level: Not on file  Occupational History   Not on file  Tobacco Use   Smoking status: Every Day    Types: Cigars   Smokeless tobacco: Never   Tobacco comments:    smokes every couple of months    Vaping Use   Vaping Use: Every day  Substance and Sexual Activity   Alcohol use: Yes    Alcohol/week: 6.0 standard drinks of alcohol    Types: 6 Standard drinks or equivalent per week   Drug use: Yes    Types: Marijuana   Sexual activity: Yes    Comment: DECLINED CONDOMS  Other Topics Concern   Not on file  Social History Narrative   Mom currently has full custody but Rochelle has gone to live with his dad. Tonight was the first night that he was at dad's house. He admitted to marijuana use  tonight, unclear when. Smoke exposure at home. No pets at home.   Social Determinants of Health   Financial Resource Strain: Not on file  Food Insecurity: Not on file  Transportation Needs: Not on file  Physical Activity: Not on file  Stress: Not on file  Social Connections: Not on file    Additional Social History: Patient resides in [place] with his mother.  He is dating however his partner is incarcerated.  Currently he does not have children and is unemployed.  He receives disability.  Patient notes that he has been sober of methamphetamines and illegal substances for 3 weeks.  He notes that drinks alcohol socially.  He also informed Clinical research associate that he vapes tobacco.  Allergies:  No Known Allergies  Metabolic Disorder Labs: Lab Results  Component Value Date   HGBA1C 5.5 09/21/2022   MPG 111.15 09/21/2022   MPG 117 (H) 01/19/2013   Lab Results  Component Value Date   PROLACTIN 9.7 09/21/2022  Lab Results  Component Value Date   CHOL 124 09/21/2022   TRIG 84 09/21/2022   HDL 39 (L) 09/21/2022   CHOLHDL 3.2 09/21/2022   VLDL 17 09/21/2022   LDLCALC 68 09/21/2022   LDLCALC 66 12/15/2021   Lab Results  Component Value Date   TSH 0.487 09/21/2022    Therapeutic Level Labs: No results found for: "LITHIUM" No results found for: "CBMZ" No results found for: "VALPROATE"  Current Medications: Current Outpatient Medications  Medication Sig Dispense Refill   acetaminophen (TYLENOL) 500 MG tablet Take 1,000 mg by mouth every 6 (six) hours as needed (For tooth pain).     BIKTARVY 50-200-25 MG TABS tablet TAKE 1 TABLET BY MOUTH 1 TIME A DAY 90 tablet 1   mirtazapine (REMERON SOL-TAB) 15 MG disintegrating tablet Take 1 tablet (15 mg total) by mouth at bedtime. 30 tablet 3   neomycin-bacitracin-polymyxin (NEOSPORIN) [phone] ointment Apply 1 Application topically 4 (four) times daily as needed (Apply to affected area).     paliperidone (INVEGA SUSTENNA) 156 MG/ML SUSY  injection Inject 1 mL (156 mg total) into the muscle every 28 (twenty-eight) days. 1 mL 11   polyethylene glycol (MIRALAX / GLYCOLAX) 17 g packet Take 17 g by mouth daily. 14 each 0   senna-docusate (SENOKOT-S) 8.6-50 MG tablet Take 1 tablet by mouth daily at 12 noon. 30 tablet 3   traZODone (DESYREL) 100 MG tablet Take 1 tablet (100 mg total) by mouth at bedtime as needed and may repeat dose one time if needed for sleep. 30 tablet 3   Current Facility-Administered Medications  Medication Dose Route Frequency Provider Last Rate Last Admin   mupirocin ointment (BACTROBAN) 2 %   Topical BID Georgina Quint, MD        Musculoskeletal: Strength & Muscle Tone: within normal limits Gait & Station: normal Patient leans: N/A  Psychiatric Specialty Exam: Review of Systems  There were no vitals taken for this visit.There is no height or weight on file to calculate BMI.  General Appearance: Well Groomed  Eye Contact:  Good  Speech:  Clear and Coherent and Normal Rate  Volume:  Normal  Mood:  Euthymic  Affect:  Appropriate and Congruent  Thought Process:  Coherent, Goal Directed, and Linear  Orientation:  Full (Time, Place, and Person)  Thought Content:  WDL and Logical  Suicidal Thoughts:  No  Homicidal Thoughts:  No  Memory:  Immediate;   Good Recent;   Good Remote;   Good  Judgement:  Good  Insight:  Good  Psychomotor Activity:  Normal  Concentration:  Concentration: Good and Attention Span: Good  Recall:  Good  Fund of Knowledge:Good  Language: Good  Akathisia:  No  Handed:  Right  AIMS (if indicated):  not done  Assets:  Communication Skills Desire for Improvement Financial Resources/Insurance Leisure Time Physical Health Social Support  ADL's:  Intact  Cognition: WNL  Sleep:  Good   Screenings: AUDIT    Flowsheet Row [facility]  Alcohol Use Disorder Identification Test Final Score (AUDIT) 0 0      GAD-7    Flowsheet Row Office Visit from 10/13/2022 in [facility]  Total GAD-7 Score 2      PHQ2-9    Flowsheet Row Office Visit from 10/13/2022 in [facility] Office Visit from 09/30/2022 in [facility] ED from 09/21/2022 in [facility] Office Visit from 02/23/2022 in [facility] Office Visit from 03/31/2021 in [facility]  PHQ-2 Total Score 0 1 0 0 3  PHQ-9 Total Score 2 -- 0 -- 21      Flowsheet Row Office Visit from 10/13/2022 in [facility] Most recent reading at 10/13/2022  1:31 PM ED from 09/21/2022 in [facility] Most recent reading at 09/21/2022  2:42 PM ED from 09/21/2022 in [facility] Most recent reading at 09/21/2022  9:22 AM  C-SSRS RISK CATEGORY No Risk No Risk No Risk       Assessment and Plan: Patient reports that he is doing well on his current medication regimen.  He was rescheduled for next injection on 10/27/2022.  He will receive Invega Sustenna 156 mg monthly.  Patient also informed writer that she vapes daily and is interested in NicoDerm patch.  Today NicoDerm CQ 21 mg patches ordered.  Patient informed Clinical research associate that he has been constipated and notes that senna is somewhat effective in managing his constipation.  Provider encouraged patient to take MiraLAX over-the-counter daily.  He endorsed understanding and agreed.  He will continue all other medications as prescribed.  He will follow-up with counseling at the [facility].  1. Schizoaffective disorder, depressive type (HCC)  Continue- mirtazapine (REMERON SOL-TAB) 15 MG disintegrating tablet; Take 1 tablet (15 mg total) by mouth at  bedtime.  Dispense: 30 tablet; Refill: 3 Continue- paliperidone (INVEGA SUSTENNA) 156 MG/ML SUSY injection; Inject 1 mL (156 mg total) into the muscle every 28 (twenty-eight) days.  Dispense: 1 mL; Refill: 11 Continue- senna-docusate (SENOKOT-S) 8.6-50 MG tablet; Take 1 tablet by mouth daily at 12 noon.  Dispense: 30 tablet; Refill: 3 Continue- traZODone (DESYREL) 100 MG tablet; Take 1 tablet (100 mg total) by mouth at bedtime as needed and may repeat dose one time if needed for sleep.  Dispense: 30 tablet; Refill: 3 Continue- paliperidone (INVEGA SUSTENNA) injection 156 mg  2. Tobacco dependence  Start- nicotine (NICODERM CQ) 21 mg/24hr patch; Place 1 patch (21 mg total) onto the skin daily.  Dispense: 90 patch; Refill: 3    Collaboration of Care: Other provider involved in patient's care [facility] shot clinic staff and PCP  Patient/Guardian was advised Release of Information must be obtained prior to any record release in order to collaborate their care with an outside provider. Patient/Guardian was advised if they have not already done so to contact the registration department to sign all necessary forms in order for [place] to release information regarding their care.   Consent: Patient/Guardian gives verbal consent for treatment and assignment of benefits for services provided during this visit. Patient/Guardian expressed understanding and agreed to proceed.   Follow-up in 2 weeks with shot clinic Follow-up in 3 months for medication management Shanna Cisco, NP 6/13/20241:35 PM

## 2022-10-27 ENCOUNTER — Ambulatory Visit (INDEPENDENT_AMBULATORY_CARE_PROVIDER_SITE_OTHER): Payer: 59

## 2022-10-27 ENCOUNTER — Encounter (HOSPITAL_COMMUNITY): Payer: Self-pay

## 2022-10-27 VITALS — BP 128/75 | HR 111 | Ht 70.0 in | Wt 137.0 lb

## 2022-10-27 DIAGNOSIS — F411 Generalized anxiety disorder: Secondary | ICD-10-CM

## 2022-10-27 DIAGNOSIS — F251 Schizoaffective disorder, depressive type: Secondary | ICD-10-CM

## 2022-10-27 DIAGNOSIS — F200 Paranoid schizophrenia: Secondary | ICD-10-CM

## 2022-10-27 DIAGNOSIS — G4700 Insomnia, unspecified: Secondary | ICD-10-CM

## 2022-10-27 NOTE — Progress Notes (Cosign Needed)
Pt presents to the office for invega sustenna 156 , given in LEFT DELTOID by Lupita Leash with no complaints pt tolerated well and will return In 28 days

## 2022-11-14 ENCOUNTER — Telehealth (HOSPITAL_COMMUNITY): Payer: Self-pay | Admitting: Psychiatry

## 2022-11-15 NOTE — Telephone Encounter (Signed)
Fl2 forms filled out. Patient informed and notes that he will pick it up later today.

## 2022-11-17 ENCOUNTER — Telehealth: Payer: Self-pay | Admitting: Emergency Medicine

## 2022-11-17 NOTE — Telephone Encounter (Signed)
Paperwork received from [facility] and placed in provider's box up front.

## 2022-11-17 NOTE — Telephone Encounter (Signed)
Patient has not been seen in office in over a year. Appt is scheduled in order to complete forms

## 2022-11-24 ENCOUNTER — Ambulatory Visit (HOSPITAL_COMMUNITY): Payer: 59

## 2022-11-25 ENCOUNTER — Encounter (HOSPITAL_COMMUNITY): Payer: Self-pay

## 2022-11-25 ENCOUNTER — Ambulatory Visit (INDEPENDENT_AMBULATORY_CARE_PROVIDER_SITE_OTHER): Payer: 59

## 2022-11-25 VITALS — BP 133/80 | HR 108 | Temp 98.5°F | Ht 68.25 in | Wt 138.0 lb

## 2022-11-25 DIAGNOSIS — F251 Schizoaffective disorder, depressive type: Secondary | ICD-10-CM | POA: Diagnosis not present

## 2022-11-25 NOTE — Progress Notes (Signed)
Patient in today for due Paliperidone Devon Hernandez) 156 mg/ml Im injection.  Patient presented with appropriate affect, level and pleasant mood and denied any current problems with auditory or visual hallucinations, no suicidal or homicidal ideations, plans, intent,or means to want to harm self or others.  Patient reported sleeping well with current medication regimen but is in need of refills of his prescribed Remeron, Trazodone and Senokot medications but needs these to now come from the [facility] on [address].  Patient reported no problem with his current monthly injection and patient's Paliperidone Devon Glatter Sustenna) 156 mg/ml IM prepared as ordered and given to patient in his right deltoid area.  Patient tolerated due injection without complaint of any pain or discomfort and agreed to return on Tuesday 12/27/22 for his next due injection in the injection clinic that date.  Patient to call if any issues or symptoms prior to next appointment. Informed patient, per Eliberto Ivory, pharmacist at the [facility] on [address] that to have his remaining refills, which he has for all medications, he could just have the [facility] on [address] call then and they would transfer his orders.  Patient stated understanding and will call back if any questions or problems getting medications.

## 2022-11-28 ENCOUNTER — Ambulatory Visit: Payer: Managed Care, Other (non HMO) | Admitting: Emergency Medicine

## 2022-11-30 ENCOUNTER — Ambulatory Visit: Payer: Medicaid Other | Admitting: Internal Medicine

## 2022-12-19 ENCOUNTER — Other Ambulatory Visit (HOSPITAL_COMMUNITY): Payer: Self-pay | Admitting: Psychiatry

## 2022-12-19 DIAGNOSIS — F251 Schizoaffective disorder, depressive type: Secondary | ICD-10-CM

## 2022-12-22 ENCOUNTER — Ambulatory Visit (HOSPITAL_COMMUNITY): Payer: 59

## 2022-12-23 ENCOUNTER — Telehealth (HOSPITAL_COMMUNITY): Payer: Self-pay

## 2022-12-23 NOTE — Telephone Encounter (Signed)
Patients guardian called in to inquire about his revised form (an FL2). She said that the original one did not have Dr. Doyne Keel full address in box 8. She said that the worker, Ms. Sena Slate told her that she is only receiving the cover letter not the form itself. Please review and advise, thank you

## 2022-12-26 ENCOUNTER — Telehealth (HOSPITAL_COMMUNITY): Payer: Self-pay | Admitting: Psychiatry

## 2022-12-26 NOTE — Telephone Encounter (Signed)
Administrative staff re-faxed form. Provider also printed form for patient and his mother to pick up at his appointment on 06/28/2022. No other concerns noted at this time.

## 2022-12-26 NOTE — Telephone Encounter (Signed)
Provider called the patient twice without sucess.  Provider left a voicemail informing patient that the form was faxed but informed her that it could be refaxed.  Provider requested the fax number to this facility.  Provider also left a message informing patient that the form would be left at the front desk for her in case she want to pick it up.  Provider informed her to call the clinic for further questions or concerns.

## 2022-12-27 ENCOUNTER — Encounter (HOSPITAL_COMMUNITY): Payer: Self-pay

## 2022-12-27 ENCOUNTER — Ambulatory Visit (INDEPENDENT_AMBULATORY_CARE_PROVIDER_SITE_OTHER): Payer: 59

## 2022-12-27 VITALS — BP 132/83 | HR 94 | Ht 68.0 in | Wt 137.0 lb

## 2022-12-27 DIAGNOSIS — F251 Schizoaffective disorder, depressive type: Secondary | ICD-10-CM

## 2022-12-27 DIAGNOSIS — G4700 Insomnia, unspecified: Secondary | ICD-10-CM

## 2022-12-27 DIAGNOSIS — F200 Paranoid schizophrenia: Secondary | ICD-10-CM

## 2022-12-27 DIAGNOSIS — F411 Generalized anxiety disorder: Secondary | ICD-10-CM | POA: Diagnosis not present

## 2022-12-27 NOTE — Progress Notes (Cosign Needed)
Pt presents to the office for invega sustenna 156 , given in LEFT DELTOID by Lupita Leash with no complaints pt tolerated well and will return In 28 days

## 2023-01-18 ENCOUNTER — Ambulatory Visit: Payer: MEDICAID | Admitting: Internal Medicine

## 2023-01-19 ENCOUNTER — Ambulatory Visit (INDEPENDENT_AMBULATORY_CARE_PROVIDER_SITE_OTHER): Payer: 59 | Admitting: Psychiatry

## 2023-01-19 ENCOUNTER — Ambulatory Visit (HOSPITAL_COMMUNITY): Payer: 59

## 2023-01-19 ENCOUNTER — Encounter (HOSPITAL_COMMUNITY): Payer: Self-pay | Admitting: Psychiatry

## 2023-01-19 DIAGNOSIS — F17200 Nicotine dependence, unspecified, uncomplicated: Secondary | ICD-10-CM | POA: Diagnosis not present

## 2023-01-19 DIAGNOSIS — F251 Schizoaffective disorder, depressive type: Secondary | ICD-10-CM

## 2023-01-19 MED ORDER — TRAZODONE HCL 100 MG PO TABS
100.0000 mg | ORAL_TABLET | Freq: Every evening | ORAL | 3 refills | Status: DC | PRN
Start: 2023-01-19 — End: 2023-03-29

## 2023-01-19 MED ORDER — MIRTAZAPINE 15 MG PO TBDP: 15.0000 mg | ORAL_TABLET | Freq: Every day | ORAL | 3 refills | Status: DC

## 2023-01-19 MED ORDER — NICOTINE 21 MG/24HR TD PT24
21.0000 mg | MEDICATED_PATCH | Freq: Every day | TRANSDERMAL | 3 refills | Status: DC
Start: 2023-01-19 — End: 2023-03-29

## 2023-01-19 MED ORDER — INVEGA SUSTENNA 156 MG/ML IM SUSY: 156.0000 mg | PREFILLED_SYRINGE | INTRAMUSCULAR | 11 refills | Status: DC

## 2023-01-19 NOTE — Progress Notes (Signed)
BH MD/PA/NP OP Progress Note  01/19/2023 1:37 PM Devon Hernandez  MRN:  [id number]  Chief Complaint: "I feel okay"  HPI: 27 year old male seen today for follow up psychiatric evaluation.  He has a psychiatric history of  Paranoid Schizophrenia, PTSD, ODD, Schizoaffective disorder, ADHD, borderline personality disorder, Meth Abuse, Suicide Attempts and 3 Psychiatric Hospitalizations (last age 52 prior to his admission at [facility] on 09/21/2022).  He informed Clinical research associate that his medications are effective in managing his psychiatric conditions.   Today he was well-groomed, pleasant, cooperative, and engaged in conversation.  He informed Clinical research associate that he is feeling okay.  He informed Clinical research associate that his mood is stable and notes that he has minimal anxiety and depression.  Today provider conducted a GAD-7 and patient scored an 8, at his last visit he scored a 2.  He informed Clinical research associate that he is somewhat concerned about his future and his mother.  He notes that he has an upcoming case in October that he is praying will be dismissed.  He notes that he will go to court for a larceny misdemeanor.  Provider also conducted PHQ-9 the patient scored a 9, at his last visit he scored a 2.  He endorses adequate sleep and appetite.  Today he denies SI/HI/VAH, mania, or paranoia.   Patient informed Clinical research associate that his boyfriend has been released from jail.  He notes that the relationship is driving.  He informed Clinical research associate that he has reduced his tobacco consumption and marijuana consumption.  He informed Clinical research associate that he is attempting to stay away from illegal substances.  He informed Clinical research associate that after his court case he is thinking about applying for school.  He notes that he has always been interested in counseling.  Overall patient notes that he is doing well.  No medication changes made today.  Patient is able to identify patient as prescribed.  He will follow-[facility].  No other concerns noted at this  time.    Visit Diagnosis:    ICD-10-CM   1. Schizoaffective disorder, depressive type (HCC)  F25.1 paliperidone (INVEGA SUSTENNA) 156 MG/ML SUSY injection    mirtazapine (REMERON SOL-TAB) 15 MG disintegrating tablet    traZODone (DESYREL) 100 MG tablet    2. Tobacco dependence  F17.200 nicotine (NICODERM CQ) 21 mg/24hr patch      Past Psychiatric History: Paranoid Schizophrenia, PTSD, ODD, Schizoaffective disorder, ADHD, borderline personality disorder, and Meth Abuse, and 4 Suicide Attempts and 3 Psychiatric Hospitalizations (last age 47 prior to his admission at [facility] on 09/21/2022)   Past Medical History:  Past Medical History:  Diagnosis Date   ADHD (attention deficit hyperactivity disorder)    Allergy    Anxiety    Bipolar disorder (HCC)    Depression    Headache(784.0)    HIV (human immunodeficiency virus infection) (HCC) 07/05/2018   Immune deficiency disorder (HCC)    Rectal abscess     Past Surgical History:  Procedure Laterality Date   TYMPANOSTOMY TUBE PLACEMENT      Family Psychiatric History:  Mother- Depression, Father- Depression and PTSD   Family History:  Family History  Problem Relation Age of Onset   Depression Mother    Hypertension Mother    Depression Father        Hx: PTSD   Diabetes Maternal Aunt    AVM Maternal Grandmother    Aneurysm Maternal Grandmother    Seizures Maternal Grandmother    Stomach cancer Neg Hx  Pancreatic cancer Neg Hx    Colon cancer Neg Hx    Liver disease Neg Hx    Esophageal cancer Neg Hx    Rectal cancer Neg Hx     Social History:  Social History   Socioeconomic History   Marital status: Single    Spouse name: Not on file   Number of children: Not on file   Years of education: Not on file   Highest education level: Not on file  Occupational History   Not on file  Tobacco Use   Smoking status: Every Day    Types: Cigars   Smokeless tobacco: Never   Tobacco comments:    Smokes 2 cigars a day but trying  to quit cutting back.   Vaping Use   Vaping status: Every Day  Substance and Sexual Activity   Alcohol use: Not Currently   Drug use: Not Currently    Types: Marijuana   Sexual activity: Yes    Comment: DECLINED CONDOMS  Other Topics Concern   Not on file  Social History Narrative   Mom currently has full custody but Izel has gone to live with his dad. Tonight was the first night that he was at dad's house. He admitted to marijuana use tonight, unclear when. Smoke exposure at home. No pets at home.   Social Determinants of Health   Financial Resource Strain: Not on file  Food Insecurity: Not on file  Transportation Needs: Not on file  Physical Activity: Not on file  Stress: Not on file  Social Connections: Not on file    Allergies: No Known Allergies  Metabolic Disorder Labs: Lab Results  Component Value Date   HGBA1C 5.5 09/21/2022   MPG 111.15 09/21/2022   MPG 117 (H) 01/19/2013   Lab Results  Component Value Date   PROLACTIN 9.7 09/21/2022   Lab Results  Component Value Date   CHOL 124 09/21/2022   TRIG 84 09/21/2022   HDL 39 (L) 09/21/2022   CHOLHDL 3.2 09/21/2022   VLDL 17 09/21/2022   LDLCALC 68 09/21/2022   LDLCALC 66 12/15/2021   Lab Results  Component Value Date   TSH 0.487 09/21/2022   TSH 1.311 01/19/2013    Therapeutic Level Labs: No results found for: "LITHIUM" No results found for: "VALPROATE" No results found for: "CBMZ"  Current Medications: Current Outpatient Medications  Medication Sig Dispense Refill   acetaminophen (TYLENOL) 500 MG tablet Take 1,000 mg by mouth every 6 (six) hours as needed (For tooth pain).     BIKTARVY 50-200-25 MG TABS tablet TAKE 1 TABLET BY MOUTH 1 TIME A DAY 90 tablet 1   mirtazapine (REMERON SOL-TAB) 15 MG disintegrating tablet Take 1 tablet (15 mg total) by mouth at bedtime. 30 tablet 3   neomycin-bacitracin-polymyxin (NEOSPORIN) [phone] ointment Apply 1 Application topically 4 (four) times daily as needed  (Apply to affected area).     nicotine (NICODERM CQ) 21 mg/24hr patch Place 1 patch (21 mg total) onto the skin daily. 90 patch 3   paliperidone (INVEGA SUSTENNA) 156 MG/ML SUSY injection Inject 1 mL (156 mg total) into the muscle every 28 (twenty-eight) days. 1 mL 11   polyethylene glycol (MIRALAX / GLYCOLAX) 17 g packet Take 17 g by mouth daily. 14 each 0   senna-docusate (SENOKOT-S) 8.6-50 MG tablet Take 1 tablet by mouth daily at 12 noon. 30 tablet 3   traZODone (DESYREL) 100 MG tablet Take 1 tablet (100 mg total) by mouth at bedtime as needed  and may repeat dose one time if needed for sleep. 30 tablet 3   Current Facility-Administered Medications  Medication Dose Route Frequency Provider Last Rate Last Admin   mupirocin ointment (BACTROBAN) 2 %   Topical BID Georgina Quint, MD       paliperidone [facility] SUSTENNA) injection 156 mg  156 mg Intramuscular Q28 days Toy Cookey E, NP   156 mg at 12/27/22 1107     Musculoskeletal: Strength & Muscle Tone: within normal limits Gait & Station: normal Patient leans: N/A  Psychiatric Specialty Exam: Review of Systems  There were no vitals taken for this visit.There is no height or weight on file to calculate BMI.  General Appearance: Well Groomed  Eye Contact:  Good  Speech:  Clear and Coherent and Normal Rate  Volume:  Normal  Mood:  Euthymic  Affect:  Appropriate and Congruent  Thought Process:  Coherent, Goal Directed, and Linear  Orientation:  Full (Time, Place, and Person)  Thought Content: WDL and Logical   Suicidal Thoughts:  No  Homicidal Thoughts:  No  Memory:  Immediate;   Good Recent;   Good Remote;   Good  Judgement:  Good  Insight:  Good  Psychomotor Activity:  Normal  Concentration:  Concentration: Good and Attention Span: Good  Recall:  Good  Fund of Knowledge: Good  Language: Good  Akathisia:  No  Handed:  Right  AIMS (if indicated): not done  Assets:  Communication Skills Desire for  Improvement Financial Resources/Insurance Housing Intimacy Leisure Time Physical Health Social Support Vocational/Educational  ADL's:  Intact  Cognition: WNL  Sleep:  Good   Screenings: AUDIT    Flowsheet Row [facility] Ad[facility]  Alcohol Use Disorder Identification Test Final Score (AUDIT) 0 0      GAD-7    Flowsheet Row Office Visit from 10/13/2022 in [facility]  Total GAD-7 Score 2      PHQ2-9    Flowsheet Row Office Visit from 10/13/2022 in [facility] Office Visit from 09/30/2022 in [facility] ED from 09/21/2022 in [facility] Office Visit from 02/23/2022 in [facility] Office Visit from 03/31/2021 in [facility]  PHQ-2 Total Score 0 1 0 0 3  PHQ-9 Total Score 2 -- 0 -- 21      Flowsheet Row Office Visit from 10/13/2022 in [facility] Most recent reading at 10/13/2022  1:31 PM ED from 09/21/2022 in [facility] Most recent reading at 09/21/2022  2:42 PM ED from 09/21/2022 in [facility] Most recent reading at 09/21/2022  9:22 AM  C-SSRS RISK CATEGORY No Risk No Risk No Risk        Assessment and Plan: Patient informed writer that his mood, sleep, anxiety, and depression are well-managed.  He denies symptoms of psychosis.  No medication changes made.  Patient agreeable to continue medication as prescribed.    Collaboration of Care: Collaboration of Care: Other provider involved in patient's care AEB PCP  Patient/Guardian was advised Release of Information must be obtained prior to any record release in order to collaborate their  care with an outside provider. Patient/Guardian was advised if they have not already done so to contact the registration department to sign  all necessary forms in order for [place] to release information regarding their care.   Consent: Patient/Guardian gives verbal consent for treatment and assignment of benefits for services provided during this visit. Patient/Guardian expressed understanding and agreed to proceed.    Shanna Cisco, NP 01/19/2023, 1:37 PM

## 2023-01-21 ENCOUNTER — Ambulatory Visit
Admission: EM | Admit: 2023-01-21 | Discharge: 2023-01-21 | Disposition: A | Discharge status: Home / Self Care | Payer: MEDICAID | Attending: Internal Medicine | Admitting: Internal Medicine

## 2023-01-21 DIAGNOSIS — L0231 Cutaneous abscess of buttock: Secondary | ICD-10-CM | POA: Diagnosis not present

## 2023-01-21 MED ORDER — DOXYCYCLINE HYCLATE 100 MG PO CAPS: 100.0000 mg | ORAL_CAPSULE | Freq: Two times a day (BID) | ORAL | 0 refills | Status: DC

## 2023-01-21 NOTE — ED Provider Notes (Signed)
[name]    CSN: [id number] Arrival date & time: 01/21/23  1430      History   Chief Complaint No chief complaint on file.   HPI Devon Hernandez is a 27 y.o. male.   Patient presents with concern of abscess to left groin/buttock area that started a few days ago.  Patient denies any drainage from the area.  He is not sure if there is any testicular involvement.  Denies any injury to the area.  Denies any purulent drainage from the area.  Denies any fever.     Past Medical History:  Diagnosis Date   ADHD (attention deficit hyperactivity disorder)    Allergy    Anxiety    Bipolar disorder (HCC)    Depression    Headache(784.0)    HIV (human immunodeficiency virus infection) (HCC) 07/05/2018   Immune deficiency disorder [facility])    Rectal abscess     Patient Active Problem List   Diagnosis Date Noted   Bipolar affective disorder, current episode depressed (HCC) 09/21/2022   Borderline personality disorder (HCC) 09/21/2022   Methamphetamine use disorder, moderate, in controlled environment (HCC) 09/21/2022   Cannabis use disorder, severe, in controlled environment, dependence (HCC) 09/21/2022   Schizoaffective disorder (HCC) 09/21/2022   Schizoaffective disorder, depressive type (HCC) 09/21/2022   Syphilis (acquired) 02/23/2022   Depression 03/31/2021   Rectal bleeding 08/27/2020   Rectal discharge 08/27/2020   Bloating 08/27/2020   Constipation 08/25/2020   Routine screening for STI (sexually transmitted infection) 04/22/2019   Encounter for long-term (current) use of high-risk medication 04/22/2019   Major depressive disorder, recurrent episode, mild (HCC) 10/13/2018   Human immunodeficiency virus (HIV) disease (HCC) 07/23/2018   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 Authorizing Provider  doxycycline (VIBRAMYCIN) 100 MG capsule Take 1 capsule (100 mg total) by mouth 2 (two) times daily. 01/21/23  Yes , Rolly Salter E, FNP  acetaminophen (TYLENOL) 500 MG tablet Take 1,000 mg by mouth every 6 (six) hours as needed (For tooth pain).    [provider]  BIKTARVY 50-200-25 MG TABS tablet TAKE 1 TABLET BY MOUTH 1 TIME A DAY 10/05/22   Comer, Belia Heman, MD  mirtazapine (REMERON SOL-TAB) 15 MG disintegrating tablet Take 1 tablet (15 mg total) by mouth at bedtime. 01/19/23   Shanna Cisco, NP  neomycin-bacitracin-polymyxin (NEOSPORIN) [phone] ointment Apply 1 Application topically 4 (four) times daily as needed (Apply to affected area).    [provider]  nicotine (NICODERM CQ) 21 mg/24hr patch Place 1 patch (21 mg total) onto the skin daily. 01/19/23   Shanna Cisco, NP  paliperidone (INVEGA SUSTENNA) 156 MG/ML SUSY injection Inject 1 mL (156 mg total) into the muscle every 28 (twenty-eight) days. 01/19/23   Shanna Cisco, NP  polyethylene glycol (MIRALAX / GLYCOLAX) 17 g packet Take 17 g by mouth daily. 09/28/22   Park Pope, MD  senna-docusate (SENOKOT-S) 8.6-50 MG tablet Take 1 tablet by mouth daily at 12 noon. 10/13/22   Doyne Keel,  Brittney E, NP  traZODone (DESYREL) 100 MG tablet Take 1 tablet (100 mg total) by mouth at bedtime as needed and may repeat dose one time if needed for sleep. 01/19/23   Shanna Cisco, NP    Family History Family History  Problem Relation Age of Onset   Depression Mother    Hypertension Mother    Depression Father        Hx: PTSD   Diabetes Maternal Aunt    AVM Maternal Grandmother    Aneurysm Maternal  Grandmother    Seizures Maternal Grandmother    Stomach cancer Neg Hx    Pancreatic cancer Neg Hx    Colon cancer Neg Hx    Liver disease Neg Hx    Esophageal cancer Neg Hx    Rectal cancer Neg Hx     Social History Social History   Tobacco Use   Smoking status: Every Day    Types: Cigars   Smokeless tobacco: Never   Tobacco comments:    Smokes 2 cigars a day but trying to quit cutting back.   Vaping Use   Vaping status: Every Day  Substance Use Topics   Alcohol use: Not Currently   Drug use: Not Currently    Types: Marijuana     Allergies   Patient has no known allergies.   Review of Systems Review of Systems Per HPI  Physical Exam Triage Vital Signs ED Triage Vitals  Encounter Vitals Group     BP 01/21/23 1439 112/73     Systolic BP Percentile --      Diastolic BP Percentile --      Pulse Rate 01/21/23 1439 92     Resp --      Temp 01/21/23 1439 98.2 F (36.8 C)     Temp Source 01/21/23 1439 Oral     SpO2 01/21/23 1439 97 %     Weight 01/21/23 1442 138 lb (62.6 kg)     Height 01/21/23 1442 5\' 9"  (1.753 m)     Head Circumference --      Peak Flow --      Pain Score 01/21/23 1441 8     Pain Loc --      Pain Education --      Exclude from Growth Chart --    No data found.  Updated Vital Signs BP 112/73 (BP Location: Left Arm)   Pulse 92   Temp 98.2 F (36.8 C) (Oral)   Ht 5\' 9"  (1.753 m)   Wt 138 lb (62.6 kg)   SpO2 97%   BMI 20.38 kg/m   Visual Acuity Right Eye Distance:   Left Eye Distance:   Bilateral Distance:    Right Eye Near:   Left Eye Near:    Bilateral Near:     Physical Exam Exam conducted with a chaperone present.  Constitutional:      General: He is not in acute distress.    Appearance: Normal appearance. He is not toxic-appearing or diaphoretic.  HENT:     Head: Normocephalic and atraumatic.  Eyes:     Extraocular Movements: Extraocular movements intact.     Conjunctiva/sclera: Conjunctivae normal.  Pulmonary:      Effort: Pulmonary effort is normal.  Genitourinary:      Comments: Patient has approximately 2.5 cm area of induration with no purulent drainage or area of fluctuance present to left inner buttocks.  It does not seem to extend to rectum or testicle. Neurological:  General: No focal deficit present.     Mental Status: He is alert and oriented to person, place, and time. Mental status is at baseline.  Psychiatric:        Mood and Affect: Mood normal.        Behavior: Behavior normal.        Thought Content: Thought content normal.        Judgment: Judgment normal.      UC Treatments / Results  Labs (all labs ordered are listed, but only abnormal results are displayed) Labs Reviewed - No data to display  EKG   Radiology No results found.  Procedures Procedures (including critical care time)  Medications Ordered in UC Medications - No data to display  Initial Impression / Assessment and Plan / UC Course  I have reviewed the triage vital signs and the nursing notes.  Pertinent labs & imaging results that were available during my care of the patient were reviewed by me and considered in my medical decision making (see chart for details).     Patient has abscess to left buttocks.  No I&D indicated at this time.  Will treat with doxycycline.  Patient advised of supportive care including warm compresses and following up if any symptoms persist or worsen.  Patient verbalized understanding and was agreeable with plan. Final Clinical Impressions(s) / UC Diagnoses   Final diagnoses:  Left buttock abscess     Discharge Instructions      I have prescribed an antibiotic to take for abscess to your groin/buttocks area.  Monitor closely and follow-up if any symptoms persist or worsen.    ED Prescriptions     Medication Sig Dispense Auth. Provider   doxycycline (VIBRAMYCIN) 100 MG capsule Take 1 capsule (100 mg total) by mouth 2 (two) times daily. 20 capsule Gustavus Bryant,  [place]      PDMP not reviewed this encounter.   Gustavus Bryant, [place] 01/21/23 [phone]

## 2023-01-21 NOTE — ED Triage Notes (Signed)
Patient presents with pain under left testicle area that started 3/4 days. Treating with heating pad and soaking in warm water.

## 2023-01-21 NOTE — Discharge Instructions (Signed)
I have prescribed an antibiotic to take for abscess to your groin/buttocks area.  Monitor closely and follow-up if any symptoms persist or worsen.

## 2023-01-24 ENCOUNTER — Encounter (HOSPITAL_COMMUNITY): Payer: Self-pay

## 2023-01-24 ENCOUNTER — Ambulatory Visit (INDEPENDENT_AMBULATORY_CARE_PROVIDER_SITE_OTHER): Payer: 59

## 2023-01-24 VITALS — BP 122/81 | HR 106 | Wt 136.0 lb

## 2023-01-24 DIAGNOSIS — F251 Schizoaffective disorder, depressive type: Secondary | ICD-10-CM | POA: Diagnosis not present

## 2023-01-24 DIAGNOSIS — F411 Generalized anxiety disorder: Secondary | ICD-10-CM

## 2023-01-24 DIAGNOSIS — G4700 Insomnia, unspecified: Secondary | ICD-10-CM

## 2023-01-24 DIAGNOSIS — F200 Paranoid schizophrenia: Secondary | ICD-10-CM

## 2023-01-24 NOTE — Progress Notes (Cosign Needed)
  Pt presents to the office for invega sustenna 156 , given in RIGHT DELTOID by Lupita Leash with no complaints pt tolerated well and will return In 28 days

## 2023-02-05 ENCOUNTER — Encounter (HOSPITAL_COMMUNITY): Payer: Self-pay

## 2023-02-05 ENCOUNTER — Other Ambulatory Visit: Payer: Self-pay

## 2023-02-05 ENCOUNTER — Emergency Department (HOSPITAL_COMMUNITY): Payer: Medicare HMO

## 2023-02-05 ENCOUNTER — Emergency Department (HOSPITAL_COMMUNITY)
Admission: EM | Admit: 2023-02-05 | Discharge: 2023-02-06 | Disposition: A | Discharge status: Home / Self Care | Payer: Medicare HMO | Attending: Emergency Medicine | Admitting: Emergency Medicine

## 2023-02-05 DIAGNOSIS — K6289 Other specified diseases of anus and rectum: Secondary | ICD-10-CM | POA: Insufficient documentation

## 2023-02-05 DIAGNOSIS — Z21 Asymptomatic human immunodeficiency virus [HIV] infection status: Secondary | ICD-10-CM | POA: Insufficient documentation

## 2023-02-05 DIAGNOSIS — L02215 Cutaneous abscess of perineum: Secondary | ICD-10-CM | POA: Diagnosis not present

## 2023-02-05 DIAGNOSIS — R102 Pelvic and perineal pain: Secondary | ICD-10-CM | POA: Diagnosis not present

## 2023-02-05 DIAGNOSIS — N492 Inflammatory disorders of scrotum: Secondary | ICD-10-CM | POA: Diagnosis present

## 2023-02-05 DIAGNOSIS — K760 Fatty (change of) liver, not elsewhere classified: Secondary | ICD-10-CM | POA: Diagnosis not present

## 2023-02-05 LAB — COMPREHENSIVE METABOLIC PANEL
ALT: 15 U/L (ref 0–44)
AST: 18 U/L (ref 15–41)
Albumin: 4.5 g/dL (ref 3.5–5.0)
Alkaline Phosphatase: 68 U/L (ref 38–126)
Anion gap: 9 (ref 5–15)
BUN: 11 mg/dL (ref 6–20)
CO2: 28 mmol/L (ref 22–32)
Calcium: 9.3 mg/dL (ref 8.9–10.3)
Chloride: 99 mmol/L (ref 98–111)
Creatinine, Ser: 0.8 mg/dL (ref 0.61–1.24)
GFR, Estimated: >60 mL/min (ref >60)
Glucose, Bld: 91 mg/dL (ref 70–99)
Potassium: 3.5 mmol/L (ref 3.5–5.1)
Sodium: 136 mmol/L (ref 135–145)
Total Bilirubin: 0.9 mg/dL (ref 0.3–1.2)
Total Protein: 8.3 g/dL — ABNORMAL HIGH (ref 6.5–8.1)

## 2023-02-05 LAB — CBC WITH DIFFERENTIAL/PLATELET
Abs Immature Granulocytes: 0.04 10*3/uL (ref 0.00–0.07)
Basophils Absolute: 0.1 10*3/uL (ref 0.0–0.1)
Basophils Relative: 1 %
Eosinophils Absolute: 0.2 10*3/uL (ref 0.0–0.5)
Eosinophils Relative: 2 %
HCT: 48 % (ref 39.0–52.0)
Hemoglobin: 16.3 g/dL (ref 13.0–17.0)
Immature Granulocytes: 0 %
Lymphocytes Relative: 33 %
Lymphs Abs: 3.7 10*3/uL (ref 0.7–4.0)
MCH: 31.2 pg (ref 26.0–34.0)
MCHC: 34 g/dL (ref 30.0–36.0)
MCV: 92 fL (ref 80.0–100.0)
Monocytes Absolute: 0.9 10*3/uL (ref 0.1–1.0)
Monocytes Relative: 8 %
Neutro Abs: 6.5 10*3/uL (ref 1.7–7.7)
Neutrophils Relative %: 56 %
Platelets: 234 10*3/uL (ref 150–400)
RBC: 5.22 MIL/uL (ref 4.22–5.81)
RDW: 14 % (ref 11.5–15.5)
WBC: 11.5 10*3/uL — ABNORMAL HIGH (ref 4.0–10.5)
nRBC: 0 % (ref 0.0–0.2)

## 2023-02-05 LAB — URINALYSIS, W/ REFLEX TO CULTURE (INFECTION SUSPECTED)
Bacteria, UA: NONE SEEN
Bilirubin Urine: NEGATIVE
Glucose, UA: NEGATIVE mg/dL
Hgb urine dipstick: NEGATIVE
Ketones, ur: NEGATIVE mg/dL
Leukocytes,Ua: NEGATIVE
Nitrite: NEGATIVE
Protein, ur: NEGATIVE mg/dL
Specific Gravity, Urine: 1.02 (ref 1.005–1.030)
pH: 6 (ref 5.0–8.0)

## 2023-02-05 LAB — LIPASE, BLOOD: Lipase: 26 U/L (ref 11–51)

## 2023-02-05 LAB — I-STAT CG4 LACTIC ACID, ED
Lactic Acid, Venous: 0.8 mmol/L (ref 0.5–1.9)
Lactic Acid, Venous: 0.7 mmol/L (ref 0.5–1.9)

## 2023-02-05 MED ORDER — BICTEGRAVIR-EMTRICITAB-TENOFOV 50-200-25 MG PO TABS: 1.0000 | ORAL_TABLET | Freq: Every day | ORAL | Status: DC

## 2023-02-05 MED ORDER — IOHEXOL 300 MG/ML  SOLN
100.0000 mL | Freq: Once | INTRAMUSCULAR | Status: AC | PRN
  Administered 2023-02-05: 100 mL via INTRAVENOUS

## 2023-02-05 NOTE — ED Triage Notes (Addendum)
Pt presents with c/o abscess under middle testicular area.  Pt was seen 3 weeks ago at [facility] and reports worsening pain and hasn't gone away since being medicated with doxycycline. Hx of abscess.

## 2023-02-05 NOTE — ED Provider Notes (Signed)
[facility] AT [facility] Provider Note   CSN: [id number] Arrival date & time: 02/05/23  1442     History  Chief Complaint  Patient presents with   Abscess    COLBURN ASPER is a 27 y.o. male history of HIV, bipolar, substance use disorder, MDD presented for rectal/scrotal abscesses that have been present for the past 5 years.  Patient states he is come and go however has not had them drained.  Patient denies any fevers.  Patient does note that earlier this year he did not take his Biktarvy for 3 months and his viral load did become detectable but is unsure what his CD4 count is.  Patient states he still have bowel movements but needs to use laxatives.  Patient denies dysuria, testicle pain, abdominal pain, nausea/vomiting, change sensation of smell skills.  Patient notes that he has multiple abscesses on his scrotum and rectum and in the area between the 2.  Patient is recent urgent care and given doxycycline however this has not been helping.  Home Medications Prior to Admission medications   Medication Sig Start Date End Date Taking? Authorizing Provider  acetaminophen (TYLENOL) 500 MG tablet Take 1,000 mg by mouth every 6 (six) hours as needed (For tooth pain).    [provider]  BIKTARVY 50-200-25 MG TABS tablet TAKE 1 TABLET BY MOUTH 1 TIME A DAY 10/05/22   Comer, Belia Heman, MD  doxycycline (VIBRAMYCIN) 100 MG capsule Take 1 capsule (100 mg total) by mouth 2 (two) times daily. 01/21/23   Gustavus Bryant, FNP  mirtazapine (REMERON SOL-TAB) 15 MG disintegrating tablet Take 1 tablet (15 mg total) by mouth at bedtime. 01/19/23   Shanna Cisco, NP  neomycin-bacitracin-polymyxin (NEOSPORIN) [phone] ointment Apply 1 Application topically 4 (four) times daily as needed (Apply to affected area).    [provider]  nicotine (NICODERM CQ) 21 mg/24hr patch Place 1 patch (21 mg total) onto the skin daily. 01/19/23   Shanna Cisco, NP   paliperidone (INVEGA SUSTENNA) 156 MG/ML SUSY injection Inject 1 mL (156 mg total) into the muscle every 28 (twenty-eight) days. 01/19/23   Shanna Cisco, NP  polyethylene glycol (MIRALAX / GLYCOLAX) 17 g packet Take 17 g by mouth daily. 09/28/22   Park Pope, MD  senna-docusate (SENOKOT-S) 8.6-50 MG tablet Take 1 tablet by mouth daily at 12 noon. 10/13/22   Shanna Cisco, NP  traZODone (DESYREL) 100 MG tablet Take 1 tablet (100 mg total) by mouth at bedtime as needed and may repeat dose one time if needed for sleep. 01/19/23   Shanna Cisco, NP      Allergies    Patient has no known allergies.    Review of Systems   Review of Systems  Physical Exam Updated Vital Signs BP 121/80   Pulse 83   Temp 98.8 F (37.1 C) (Oral)   Resp 10   Ht 5\' 9"  (1.753 m)   Wt 62.1 kg   SpO2 99%   BMI 20.23 kg/m  Physical Exam Constitutional:      General: He is not in acute distress. Cardiovascular:     Rate and Rhythm: Normal rate and regular rhythm.     Pulses: Normal pulses.     Heart sounds: Normal heart sounds.  Pulmonary:     Effort: Pulmonary effort is normal. No respiratory distress.     Breath sounds: Normal breath sounds.  Abdominal:     Palpations: Abdomen is  soft.     Tenderness: There is no abdominal tenderness. There is no guarding or rebound.  Genitourinary:    Comments: Chaperone: Abreu, Denisse A, RN Abscesses noted in the perineal region along with the perirectal region and in the inferior portion of the scrotum Unable to express out any purulent material however the area is fluctuant No signs of necrotic tissue Skin:    General: Skin is warm and dry.  Neurological:     Mental Status: He is alert.     ED Results / Procedures / Treatments   Labs (all labs ordered are listed, but only abnormal results are displayed) Labs Reviewed  CULTURE, BLOOD (ROUTINE X 2)  COMPREHENSIVE METABOLIC PANEL  CBC WITH DIFFERENTIAL/PLATELET  I-STAT CG4 LACTIC ACID, ED     EKG None  Radiology No results found.  Procedures Procedures    Medications Ordered in ED Medications - No data to display  ED Course/ Medical Decision Making/ A&P                                 Medical Decision Making Amount and/or Complexity of Data Reviewed Labs: ordered. Radiology: ordered.   [name] 27 y.o. presented today for GU abscesses. Working DDx that I considered at this time includes, but not limited to, scrotal/perirectal abscesses, intrapelvic abscesses, electrolyte abnormalities, sepsis, Fournier's, necrotizing fasciitis.  R/o DDx: pending  Review of prior external notes: 01/20/2023 ED  Unique Tests and My Interpretation:  CBC: Unremarkable CMP: Unremarkable Lipase: Unremarkable Blood cultures: Pending UA: Unremarkable Lactic acid: Negative CT abdomen pelvis with contrast: pending  Discussion with Independent Historian:  Mother  Discussion of Management of Tests: None  Risk: Low: based on diagnostic testing/clinical impression and treatment plan  Risk Stratification Score: None  Plan: On exam patient was in no acute distress with stable vitals.  Patient's exam with a chaperone in the room does show perirectal abscesses along abscess in the perineal region and at the inferior portion of his scrotum.  I was unable to express that any purulent material.  The rest patient's exam is unremarkable.  Patient did report not being compliant with his Biktarvy for 3 months this year and his last viral load was detectable and so labs were drawn along blood cultures and CT scan.  Patient not requiring any pain meds at this time.  Patient signed out to Barrie Dunker, PA-C.  Please review their note for the continuation of patient's care.  The plan at this point is follow-up on CT scan and dispo accordingly.  Will need to follow-up with general surgery however if there are intra-abdominal or deep abscesses may need to consult surgery.  If no deep  abscesses can discharge on doxycycline with general surgery follow-up.  Patient was given return precautions. Patient stable for discharge at this time.  Patient verbalized understanding of plan.         Final Clinical Impression(s) / ED Diagnoses Final diagnoses:  None    Rx / DC Orders ED Discharge Orders     None         Remi Deter 02/05/23 2334    Alvira Monday, MD 02/06/23 2213

## 2023-02-06 DIAGNOSIS — K6289 Other specified diseases of anus and rectum: Secondary | ICD-10-CM | POA: Diagnosis not present

## 2023-02-06 MED ORDER — BICTEGRAVIR-EMTRICITAB-TENOFOV 50-200-25 MG PO TABS
1.0000 | ORAL_TABLET | Freq: Every day | ORAL | Status: DC
  Administered 2023-02-06: 1 via ORAL
  Filled 2023-02-06 (×2): qty 1

## 2023-02-06 MED ORDER — DOXYCYCLINE HYCLATE 100 MG PO CAPS: 100.0000 mg | ORAL_CAPSULE | Freq: Two times a day (BID) | ORAL | 0 refills | Status: AC

## 2023-02-06 MED ORDER — CEFTRIAXONE SODIUM 1 G IJ SOLR
500.0000 mg | Freq: Once | INTRAMUSCULAR | Status: AC
  Administered 2023-02-06: 500 mg via INTRAMUSCULAR
  Filled 2023-02-06: qty 10

## 2023-02-06 MED ORDER — STERILE WATER FOR INJECTION IJ SOLN
INTRAMUSCULAR | Status: AC
  Filled 2023-02-06: qty 10

## 2023-02-06 NOTE — Discharge Instructions (Addendum)
You were evaluated today for concerns of a possible abscesses.  No abscess was noted on imaging.  You do have signs of proctitis.  You were given an antibiotic here in the emergency department and I have prescribed an antibiotic to take at home.  It is important you follow-up with infectious disease and primary care for further management.

## 2023-02-06 NOTE — ED Notes (Signed)
Pt refused discharge VS and refused to stay after meds were administrated. Pt ambulatory out of department.

## 2023-02-06 NOTE — ED Provider Notes (Signed)
  Physical Exam  BP 104/60 (BP Location: Left Arm)   Pulse 79   Temp 97.7 F (36.5 C) (Oral)   Resp 18   Ht 5\' 9"  (1.753 m)   Wt 62.1 kg   SpO2 96%   BMI 20.23 kg/m   Physical Exam  Procedures  Procedures  ED Course / MDM    Medical Decision Making Amount and/or Complexity of Data Reviewed Labs: ordered. Radiology: ordered.  Risk Prescription drug management.   Patient care assumed at shift handoff from [facility], PA-C.  Please see his note for full details.  In short, 27 year old male patient with history of HIV, substance use disorder, bipolar, presented for possible rectal/scrotal abscesses which have been present for approximately 5 years.  He reports that they come and go but have never been drained.  He denies fevers.  He reportedly has not had his Biktarvy for approximately 3 months and is unsure of his CD4 count.  He is able to have bowel movements but uses laxatives.   At time of shift handoff plan to follow-up on CT abdomen pelvis.  Also plan to discharge on course of doxycycline. CT exam shows 1. Infectious or inflammatory proctitis. No loculated perirectal or  perianal fluid collections identified. No evidence of obstruction or  perforation.  2. Inflammatory changes surround the seminal vesicles bilaterally  possibly related to the adjacent inflammatory process involving the  rectum or a superimposed infectious or inflammatory prostatitis.  3. Scrotal wall edema without a discrete loculated subcutaneous  fluid collection identified.  4. Mild hepatic steatosis.   No obvious drainable abscess noted on imaging.  Patient with signs of proctitis.  Will administer Rocephin and prescribed doxycycline.  Patient to follow-up with infectious disease for further evaluation.   Darrick Grinder, PA-C 02/06/23 0046    Nira Conn, MD 02/07/23 [phone]

## 2023-02-07 ENCOUNTER — Ambulatory Visit: Payer: Medicare HMO | Admitting: Internal Medicine

## 2023-02-07 LAB — BLOOD CULTURE ID PANEL (REFLEXED) - BCID2

## 2023-02-08 LAB — CULTURE, BLOOD (ROUTINE X 2): Special Requests: ADEQUATE

## 2023-02-09 ENCOUNTER — Telehealth (HOSPITAL_BASED_OUTPATIENT_CLINIC_OR_DEPARTMENT_OTHER): Payer: Self-pay | Admitting: *Deleted

## 2023-02-09 NOTE — Telephone Encounter (Addendum)
Post ED Visit - Positive Culture Follow-up  Culture report reviewed by antimicrobial stewardship pharmacist: [facility] []  Enzo Bi, Pharm.D. []  Celedonio Miyamoto, Pharm.D., BCPS AQ-ID []  Garvin Fila, Pharm.D., BCPS []  Georgina Pillion, Pharm.D., BCPS []  [place], [address].D., BCPS, AAHIVP []  Estella Husk, Pharm.D., BCPS, AAHIVP []  Lysle Pearl, PharmD, BCPS []  Phillips Climes, PharmD, BCPS []  Agapito Games, PharmD, BCPS []  Verlan Friends, PharmD []  Mervyn Gay, PharmD, BCPS []  Vinnie Level, PharmD  [facility] [x]  Roslyn Smiling, PharmD []  Greer Pickerel, PharmD []  Adalberto Cole, PharmD []  Perlie Gold, Rph []  Lonell Face) Jean Rosenthal, PharmD []  Earl Many, PharmD []  Junita Push, PharmD []  Dorna Leitz, PharmD []  Terrilee Files, PharmD []  Lynann Beaver, PharmD []  Keturah Barre, PharmD []  Loralee Pacas, PharmD []  Bernadene Person, PharmD   Positive Blood culture Continue Treatment with doxycycline, organism sensitive to the same and patient to patient follow-up in ID clinic on 15th. No changes from current regimen warrant. Per Dr Estanislado Pandy.  Bing Quarry 02/09/2023, 10:43 AM

## 2023-02-09 NOTE — Progress Notes (Signed)
ED Antimicrobial Stewardship Positive Culture Follow Up   Devon Hernandez is an 27 y.o. male who presented to [facility] on 02/05/2023 with a chief complaint of  Chief Complaint  Patient presents with   Abscess    Recent Results (from the past 720 hour(s))  Culture, blood (routine x 2)     Status: None (Preliminary result)   Collection Time: 02/05/23  8:12 PM   Specimen: BLOOD  Result Value Ref Range Status   Specimen Description BLOOD RIGHT ANTECUBITAL  Final   Special Requests   Final    BOTTLES DRAWN AEROBIC AND ANAEROBIC Blood Culture adequate volume   Culture   Final    NO GROWTH 3 DAYS Performed at [facility], [address], [place], [place] [address]    Report Status PENDING  Incomplete  Culture, blood (routine x 2)     Status: Abnormal   Collection Time: 02/05/23  9:40 PM   Specimen: BLOOD  Result Value Ref Range Status   Specimen Description BLOOD SITE NOT SPECIFIED  Final   Special Requests   Final    BOTTLES DRAWN AEROBIC AND ANAEROBIC Blood Culture adequate volume   Culture  Setup Time   Final    GRAM POSITIVE COCCI IN CLUSTERS AEROBIC BOTTLE ONLY CRITICAL RESULT CALLED TO, READ BACK BY AND VERIFIED WITH: GIBSON RN 02/07/2023 @ 0624 BY AB     Culture (A)  Final    STAPHYLOCOCCUS CAPITIS THE SIGNIFICANCE OF ISOLATING THIS ORGANISM FROM A SINGLE SET OF BLOOD CULTURES WHEN MULTIPLE SETS ARE DRAWN IS UNCERTAIN. PLEASE NOTIFY THE MICROBIOLOGY DEPARTMENT WITHIN ONE WEEK IF SPECIATION AND SENSITIVITIES ARE REQUIRED. Performed at [facility], [address] [place], [place] [address]    Report Status 02/08/2023 FINAL  Final  Blood Culture ID Panel (Reflexed)     Status: Abnormal   Collection Time: 02/05/23  9:40 PM  Result Value Ref Range Status   Enterococcus faecalis NOT DETECTED NOT DETECTED Final   Enterococcus Faecium NOT DETECTED NOT DETECTED Final   Listeria monocytogenes NOT DETECTED NOT DETECTED Final   Staphylococcus species DETECTED (A) NOT  DETECTED Final    Comment: CRITICAL RESULT CALLED TO, READ BACK BY AND VERIFIED WITH: GIBSON RN 02/07/2023 @ 0624 BY AB     Staphylococcus aureus (BCID) NOT DETECTED NOT DETECTED Final   Staphylococcus epidermidis NOT DETECTED NOT DETECTED Final   Staphylococcus lugdunensis NOT DETECTED NOT DETECTED Final   Streptococcus species NOT DETECTED NOT DETECTED Final   Streptococcus agalactiae NOT DETECTED NOT DETECTED Final   Streptococcus pneumoniae NOT DETECTED NOT DETECTED Final   Streptococcus pyogenes NOT DETECTED NOT DETECTED Final   A.calcoaceticus-baumannii NOT DETECTED NOT DETECTED Final   Bacteroides fragilis NOT DETECTED NOT DETECTED Final   Enterobacterales NOT DETECTED NOT DETECTED Final   Enterobacter cloacae complex NOT DETECTED NOT DETECTED Final   Escherichia coli NOT DETECTED NOT DETECTED Final   Klebsiella aerogenes NOT DETECTED NOT DETECTED Final   Klebsiella oxytoca NOT DETECTED NOT DETECTED Final   Klebsiella pneumoniae NOT DETECTED NOT DETECTED Final   Proteus species NOT DETECTED NOT DETECTED Final   Salmonella species NOT DETECTED NOT DETECTED Final   Serratia marcescens NOT DETECTED NOT DETECTED Final   Haemophilus influenzae NOT DETECTED NOT DETECTED Final   Neisseria meningitidis NOT DETECTED NOT DETECTED Final   Pseudomonas aeruginosa NOT DETECTED NOT DETECTED Final   Stenotrophomonas maltophilia NOT DETECTED NOT DETECTED Final   Candida albicans NOT DETECTED NOT DETECTED  Final   Candida auris NOT DETECTED NOT DETECTED Final   Candida glabrata NOT DETECTED NOT DETECTED Final   Candida krusei NOT DETECTED NOT DETECTED Final   Candida parapsilosis NOT DETECTED NOT DETECTED Final   Candida tropicalis NOT DETECTED NOT DETECTED Final   Cryptococcus neoformans/gattii NOT DETECTED NOT DETECTED Final    Comment: Performed at [facility], [address] [place], [place] [address]   Patient presented with abscess under middle testicular area/possible proctitis.  Patient seen 3 weeks prior at [facility] for same complaint and was treated with doxycycline. Growth in 1/4 blood cultures so most likely a contaminant.   Patient has outpatient appointment Tuesday (02/14/2023) with ID clinic. Plan to have patient follow up with patient's abscess then. Continue doxycycline therapy.    ED Provider: Estanislado Pandy, DO  Roslyn Smiling, PharmD PGY1 Pharmacy Resident 02/09/2023 10:01 AM  Clinical Pharmacist Monday - Friday phone -  [phone] Saturday - Sunday phone - [phone]

## 2023-02-10 LAB — CULTURE, BLOOD (ROUTINE X 2)
Culture: NO GROWTH
Special Requests: ADEQUATE

## 2023-02-13 ENCOUNTER — Other Ambulatory Visit (HOSPITAL_COMMUNITY): Payer: Self-pay

## 2023-02-13 ENCOUNTER — Telehealth: Payer: Self-pay

## 2023-02-13 NOTE — Telephone Encounter (Signed)
[facility] Patient Advocate Encounter  Insurance verification completed.    The patient is insured through [employer]. Patient has Medicare and is not eligible for a copay card, but may be able to apply for patient assistance, if available.    Ran test claim for BIKTARVY   The current 30 day co-pay is $0.  We will continue to follow to see if copay assistance is needed.  This test claim was processed through [facility]- copay amounts may vary at other pharmacies due to pharmacy/plan contracts, or as the patient moves through the different stages of their insurance plan.

## 2023-02-14 ENCOUNTER — Other Ambulatory Visit: Payer: Self-pay

## 2023-02-14 ENCOUNTER — Encounter: Payer: Self-pay | Admitting: Internal Medicine

## 2023-02-14 ENCOUNTER — Ambulatory Visit (INDEPENDENT_AMBULATORY_CARE_PROVIDER_SITE_OTHER): Payer: Medicare HMO | Admitting: Internal Medicine

## 2023-02-14 ENCOUNTER — Other Ambulatory Visit (HOSPITAL_COMMUNITY)
Admission: RE | Admit: 2023-02-14 | Discharge: 2023-02-14 | Disposition: A | Discharge status: Home / Self Care | Payer: Medicare HMO | Source: Ambulatory Visit | Attending: Internal Medicine | Admitting: Internal Medicine

## 2023-02-14 VITALS — BP 126/75 | HR 112 | Temp 97.9°F | Ht 70.0 in | Wt 136.0 lb

## 2023-02-14 DIAGNOSIS — M62838 Other muscle spasm: Secondary | ICD-10-CM | POA: Diagnosis not present

## 2023-02-14 DIAGNOSIS — Z113 Encounter for screening for infections with a predominantly sexual mode of transmission: Secondary | ICD-10-CM

## 2023-02-14 DIAGNOSIS — B20 Human immunodeficiency virus [HIV] disease: Secondary | ICD-10-CM | POA: Insufficient documentation

## 2023-02-14 DIAGNOSIS — Z23 Encounter for immunization: Secondary | ICD-10-CM

## 2023-02-14 MED ORDER — MELOXICAM 15 MG PO TABS: 15.0000 mg | ORAL_TABLET | Freq: Every day | ORAL | 0 refills | Status: DC

## 2023-02-14 MED ORDER — BIKTARVY 50-200-25 MG PO TABS
1.0000 | ORAL_TABLET | Freq: Every day | ORAL | 3 refills | Status: DC
Start: 2023-02-14 — End: 2023-10-09

## 2023-02-14 NOTE — Assessment & Plan Note (Signed)
Has a significant spasm on right side of back.   Will give meloxicam for 10 days.  Discussed stretching, posture

## 2023-02-14 NOTE — Assessment & Plan Note (Signed)
He is doing well and will check his labs today. Refills provided. Follow up in about 3-4 months.

## 2023-02-14 NOTE — Progress Notes (Signed)
   Subjective:    Patient ID: Devon Hernandez, male    DOB: 03-26-1996, 27 y.o.   MRN: [id number]  HPI Alexis is here for follow up of HIV He came back in to care in May with recent methamphetamine abuse and getting back on track.  He missed follow up. He has continued on Biktarvy with no missed doses.  He feels well, has gained some weight back.  Continues in counseling and in NA.  Having some pain in his back, wraps around in a band-like pattern.    Review of Systems  Constitutional:  Negative for fatigue.  Gastrointestinal:  Negative for diarrhea and nausea.  Skin:  Negative for rash.       Objective:   Physical Exam Eyes:     General: No scleral icterus. Pulmonary:     Effort: Pulmonary effort is normal.  Neurological:     Mental Status: He is alert.   SH: + tobacco        Assessment & Plan:

## 2023-02-14 NOTE — Assessment & Plan Note (Signed)
Will screen today

## 2023-02-15 LAB — CYTOLOGY, (ORAL, ANAL, URETHRAL) ANCILLARY ONLY
Chlamydia: NEGATIVE
Chlamydia: NEGATIVE
Comment: NEGATIVE
Comment: NEGATIVE
Comment: NORMAL
Comment: NORMAL
Neisseria Gonorrhea: NEGATIVE
Neisseria Gonorrhea: NEGATIVE

## 2023-02-15 LAB — T-HELPER CELL (CD4) - (RCID CLINIC ONLY)
CD4 % Helper T Cell: 43 % (ref 33–65)
CD4 T Cell Abs: 955 /uL (ref 400–1790)

## 2023-02-15 LAB — URINE CYTOLOGY ANCILLARY ONLY
Chlamydia: NEGATIVE
Comment: NEGATIVE
Comment: NORMAL
Neisseria Gonorrhea: NEGATIVE

## 2023-02-16 ENCOUNTER — Telehealth: Payer: Self-pay

## 2023-02-16 NOTE — Telephone Encounter (Signed)
Received faxed refill request for Meloxicam 15 mg tablet. Will forward message to provider to advise on refill request. [name], RMA

## 2023-02-19 LAB — HIV-1 RNA QUANT-NO REFLEX-BLD
HIV 1 RNA Quant: 31 {copies}/mL — ABNORMAL HIGH
HIV-1 RNA Quant, Log: 1.49 {Log} — ABNORMAL HIGH

## 2023-02-19 LAB — RPR TITER: RPR Titer: 1:4 {titer} — ABNORMAL HIGH

## 2023-02-19 LAB — RPR: RPR Ser Ql: REACTIVE — AB

## 2023-02-19 LAB — T PALLIDUM AB: T Pallidum Abs: POSITIVE — AB

## 2023-02-21 ENCOUNTER — Ambulatory Visit (HOSPITAL_COMMUNITY): Payer: Medicare HMO

## 2023-02-22 ENCOUNTER — Other Ambulatory Visit: Payer: Self-pay | Admitting: Internal Medicine

## 2023-02-22 DIAGNOSIS — M62838 Other muscle spasm: Secondary | ICD-10-CM

## 2023-02-22 NOTE — Telephone Encounter (Signed)
Please advise 

## 2023-02-23 ENCOUNTER — Ambulatory Visit (HOSPITAL_COMMUNITY): Payer: Medicare HMO

## 2023-02-23 ENCOUNTER — Other Ambulatory Visit: Payer: Self-pay

## 2023-02-23 ENCOUNTER — Encounter (HOSPITAL_COMMUNITY): Payer: Self-pay

## 2023-02-23 ENCOUNTER — Other Ambulatory Visit: Payer: Self-pay | Admitting: Internal Medicine

## 2023-02-23 VITALS — BP 124/81 | HR 101 | Ht 69.0 in | Wt 134.0 lb

## 2023-02-23 DIAGNOSIS — F251 Schizoaffective disorder, depressive type: Secondary | ICD-10-CM | POA: Diagnosis not present

## 2023-02-23 DIAGNOSIS — F200 Paranoid schizophrenia: Secondary | ICD-10-CM

## 2023-02-23 DIAGNOSIS — M62838 Other muscle spasm: Secondary | ICD-10-CM

## 2023-02-23 MED ORDER — MELOXICAM 15 MG PO TABS
15.0000 mg | ORAL_TABLET | Freq: Every day | ORAL | 2 refills | Status: DC
Start: 2023-02-23 — End: 2023-07-10

## 2023-02-23 NOTE — Progress Notes (Signed)
Patient arrives today for his due injection of Invega Sustenna 156 mg/mL. Patient is friendly with a bright affect. Patient is enjoying the cooler weather, he has some frustration with the status of his disability. Patient denies any SI/HI or AVH. Injection was prepared as ordered and administered in patients left deltoid. Patient tolerated well and without complaint. Patient will follow up in 28 days    NDC: 96295-284-13 LOT: [id number] EXP: 2026 - JAN

## 2023-02-27 ENCOUNTER — Telehealth: Payer: Self-pay

## 2023-02-27 NOTE — Telephone Encounter (Signed)
Received call from Jomarie Longs, pharmacist at [facility] wanting to make sure provider is aware of DDI between [facility] and Meloxicam.   Relayed that Dr. Luciana Axe approved medication refill, DDI reviewed by pharmacist Margarite Gouge, [facility] who recommends continued monitoring.   Sandie Ano, RN

## 2023-03-23 ENCOUNTER — Ambulatory Visit (HOSPITAL_COMMUNITY): Payer: Medicare HMO | Admitting: *Deleted

## 2023-03-23 ENCOUNTER — Encounter (HOSPITAL_COMMUNITY): Payer: Self-pay

## 2023-03-23 VITALS — BP 127/81 | HR 107 | Resp 16 | Ht 69.0 in | Wt 146.6 lb

## 2023-03-23 DIAGNOSIS — F200 Paranoid schizophrenia: Secondary | ICD-10-CM

## 2023-03-23 DIAGNOSIS — F251 Schizoaffective disorder, depressive type: Secondary | ICD-10-CM

## 2023-03-23 NOTE — Progress Notes (Signed)
 Patient arrived for injection of Gean [place]ch[place] 156mg  that he brought from his pharmacy. Denies any issues or side effects. States medication is working well. Given in Right Deltoid without issue or complaint. Will return in 28 days.

## 2023-03-29 ENCOUNTER — Telehealth (HOSPITAL_COMMUNITY): Payer: Medicare HMO | Admitting: Psychiatry

## 2023-03-29 ENCOUNTER — Encounter (HOSPITAL_COMMUNITY): Payer: Self-pay | Admitting: Psychiatry

## 2023-03-29 DIAGNOSIS — F17210 Nicotine dependence, cigarettes, uncomplicated: Secondary | ICD-10-CM | POA: Diagnosis not present

## 2023-03-29 DIAGNOSIS — F251 Schizoaffective disorder, depressive type: Secondary | ICD-10-CM

## 2023-03-29 DIAGNOSIS — F17200 Nicotine dependence, unspecified, uncomplicated: Secondary | ICD-10-CM

## 2023-03-29 MED ORDER — NICOTINE 21 MG/24HR TD PT24
21.0000 mg | MEDICATED_PATCH | Freq: Every day | TRANSDERMAL | 3 refills | Status: DC
Start: 2023-03-29 — End: 2023-06-07

## 2023-03-29 MED ORDER — INVEGA SUSTENNA 156 MG/ML IM SUSY
156.0000 mg | PREFILLED_SYRINGE | INTRAMUSCULAR | 11 refills | Status: DC
Start: 2023-03-29 — End: 2023-06-07

## 2023-03-29 MED ORDER — TRAZODONE HCL 100 MG PO TABS: 100.0000 mg | ORAL_TABLET | Freq: Every evening | ORAL | 3 refills | Status: DC | PRN

## 2023-03-29 MED ORDER — MIRTAZAPINE 15 MG PO TBDP
15.0000 mg | ORAL_TABLET | Freq: Every day | ORAL | 3 refills | Status: DC
Start: 2023-03-29 — End: 2023-06-07

## 2023-03-29 NOTE — Progress Notes (Signed)
BH MD/PA/NP OP Progress Note Virtual Visit via Video Note  I connected with Devon Hernandez on 03/29/23 at  8:30 AM EST by a video enabled telemedicine application and verified that I am speaking with the correct person using two identifiers.  Location: Patient: Home Provider: Clinic   I discussed the limitations of evaluation and management by telemedicine and the availability of in person appointments. The patient expressed understanding and agreed to proceed.  I provided 30 minutes of non-face-to-face time during this encounter.    03/29/2023 8:45 AM Devon Hernandez  MRN:  [id number]  Chief Complaint: "I am fair"  HPI: 27 year old male seen today for follow up psychiatric evaluation.  He has a psychiatric history of  Paranoid Schizophrenia, PTSD, ODD, Schizoaffective disorder, ADHD, borderline personality disorder, Meth Abuse, Suicide Attempts and 3 Psychiatric Hospitalizations (last age 73 prior to his admission at [facility] on 09/21/2022).  He informed Clinical research associate that his medications are effective in managing his psychiatric conditions.   Today he was well-groomed, pleasant, cooperative, and engaged in conversation.  He informed Clinical research associate that he is fair. He reports that he notes that that at times he lacks motivation but notes that it could be the holidays weighing on him. Patient notes that his charges will be dropped next October if he completes 48 hours at the [facility]. He notes that he is excited about this. He also reports that his boyfriend and mother are doing well.  Mentally patient notes that his mood continues to be stable and notes that he has minimal anxiety and depression.   Today provider conducted a GAD-7 and patient scored an 6, at his last visit he scored a 8.   Provider also conducted PHQ-9 the patient scored a 4, at his last visit he scored a 9.  He endorses adequate sleep and appetite.  Today he denies SI/HI/VAH, mania, or paranoia.   Patient informed writer that he reduced his  tobacco consumption. He reports that he smoke 3 cigarettes instead of 5 daily.  Overall patient notes that he is doing well.  No medication changes made today.  Patient is able to identify patient as prescribed.  He will follow-up [facility].  No other concerns noted at this time.    Visit Diagnosis:    ICD-10-CM   1. Schizoaffective disorder, depressive type (HCC)  F25.1 traZODone (DESYREL) 100 MG tablet    paliperidone (INVEGA SUSTENNA) 156 MG/ML SUSY injection    mirtazapine (REMERON SOL-TAB) 15 MG disintegrating tablet    2. Tobacco dependence  F17.200 nicotine (NICODERM CQ) 21 mg/24hr patch       Past Psychiatric History: Paranoid Schizophrenia, PTSD, ODD, Schizoaffective disorder, ADHD, borderline personality disorder, and Meth Abuse, and 4 Suicide Attempts and 3 Psychiatric Hospitalizations (last age 59 prior to his admission at [facility] on 09/21/2022)   Past Medical History:  Past Medical History:  Diagnosis Date   ADHD (attention deficit hyperactivity disorder)    Allergy    Anxiety    Bipolar disorder (HCC)    Depression    Headache(784.0)    HIV (human immunodeficiency virus infection) (HCC) 07/05/2018   Immune deficiency disorder (HCC)    Rectal abscess     Past Surgical History:  Procedure Laterality Date   TYMPANOSTOMY TUBE PLACEMENT      Family Psychiatric History:  Mother- Depression, Father- Depression and PTSD   Family History:  Family History  Problem Relation Age of Onset   Depression Mother  Hypertension Mother    Depression Father        Hx: PTSD   Diabetes Maternal Aunt    AVM Maternal Grandmother    Aneurysm Maternal Grandmother    Seizures Maternal Grandmother    Stomach cancer Neg Hx    Pancreatic cancer Neg Hx    Colon cancer Neg Hx    Liver disease Neg Hx    Esophageal cancer Neg Hx    Rectal cancer Neg Hx     Social History:  Social History   Socioeconomic History   Marital status: Single     Spouse name: Not on file   Number of children: Not on file   Years of education: Not on file   Highest education level: Not on file  Occupational History   Not on file  Tobacco Use   Smoking status: Every Day    Types: Cigars   Smokeless tobacco: Never   Tobacco comments:    Smokes 2 cigars a day but trying to quit cutting back.   Vaping Use   Vaping status: Every Day  Substance and Sexual Activity   Alcohol use: Not Currently   Drug use: Not Currently    Types: Marijuana   Sexual activity: Yes    Comment: DECLINED CONDOMS  Other Topics Concern   Not on file  Social History Narrative   Mom currently has full custody but Era has gone to live with his dad. Tonight was the first night that he was at dad's house. He admitted to marijuana use tonight, unclear when. Smoke exposure at home. No pets at home.   Social Determinants of Health   Financial Resource Strain: Not on file  Food Insecurity: Not on file  Transportation Needs: Not on file  Physical Activity: Not on file  Stress: Not on file  Social Connections: Not on file    Allergies: No Known Allergies  Metabolic Disorder Labs: Lab Results  Component Value Date   HGBA1C 5.5 09/21/2022   MPG 111.15 09/21/2022   MPG 117 (H) 01/19/2013   Lab Results  Component Value Date   PROLACTIN 9.7 09/21/2022   Lab Results  Component Value Date   CHOL 124 09/21/2022   TRIG 84 09/21/2022   HDL 39 (L) 09/21/2022   CHOLHDL 3.2 09/21/2022   VLDL 17 09/21/2022   LDLCALC 68 09/21/2022   LDLCALC 66 12/15/2021   Lab Results  Component Value Date   TSH 0.487 09/21/2022   TSH 1.311 01/19/2013    Therapeutic Level Labs: No results found for: "LITHIUM" No results found for: "VALPROATE" No results found for: "CBMZ"  Current Medications: Current Outpatient Medications  Medication Sig Dispense Refill   acetaminophen (TYLENOL) 500 MG tablet Take 1,000 mg by mouth every 6 (six) hours as needed (For tooth pain).      bictegravir-emtricitabine-tenofovir AF (BIKTARVY) 50-200-25 MG TABS tablet Take 1 tablet by mouth daily. 90 tablet 3   meloxicam (MOBIC) 15 MG tablet Take 1 tablet (15 mg total) by mouth daily. 30 tablet 2   mirtazapine (REMERON SOL-TAB) 15 MG disintegrating tablet Take 1 tablet (15 mg total) by mouth at bedtime. 30 tablet 3   neomycin-bacitracin-polymyxin (NEOSPORIN) [phone] ointment Apply 1 Application topically 4 (four) times daily as needed (Apply to affected area).     nicotine (NICODERM CQ) 21 mg/24hr patch Place 1 patch (21 mg total) onto the skin daily. 90 patch 3   paliperidone (INVEGA SUSTENNA) 156 MG/ML SUSY injection Inject 1 mL (156 mg total)  into the muscle every 28 (twenty-eight) days. 1 mL 11   polyethylene glycol (MIRALAX / GLYCOLAX) 17 g packet Take 17 g by mouth daily. 14 each 0   senna-docusate (SENOKOT-S) 8.6-50 MG tablet Take 1 tablet by mouth daily at 12 noon. 30 tablet 3   traZODone (DESYREL) 100 MG tablet Take 1 tablet (100 mg total) by mouth at bedtime as needed and may repeat dose one time if needed for sleep. 30 tablet 3   Current Facility-Administered Medications  Medication Dose Route Frequency Provider Last Rate Last Admin   mupirocin ointment (BACTROBAN) 2 %   Topical BID Georgina Quint, MD       paliperidone [facility] SUSTENNA) injection 156 mg  156 mg Intramuscular Q28 days Toy Cookey E, NP   156 mg at 03/23/23 1447     Musculoskeletal: Strength & Muscle Tone: within normal limits Gait & Station: normal Patient leans: N/A  Psychiatric Specialty Exam: Review of Systems  There were no vitals taken for this visit.There is no height or weight on file to calculate BMI.  General Appearance: Well Groomed  Eye Contact:  Good  Speech:  Clear and Coherent and Normal Rate  Volume:  Normal  Mood:  Euthymic  Affect:  Appropriate and Congruent  Thought Process:  Coherent, Goal Directed, and Linear  Orientation:  Full (Time, Place, and Person)   Thought Content: WDL and Logical   Suicidal Thoughts:  No  Homicidal Thoughts:  No  Memory:  Immediate;   Good Recent;   Good Remote;   Good  Judgement:  Good  Insight:  Good  Psychomotor Activity:  Normal  Concentration:  Concentration: Good and Attention Span: Good  Recall:  Good  Fund of Knowledge: Good  Language: Good  Akathisia:  No  Handed:  Right  AIMS (if indicated): not done  Assets:  Communication Skills Desire for Improvement Financial Resources/Insurance Housing Intimacy Leisure Time Physical Health Social Support Vocational/Educational  ADL's:  Intact  Cognition: WNL  Sleep:  Good   Screenings: AUDIT    Flowsheet Row [facility] Ad[facility] ([facility]  Alcohol Use Disorder Identification Test Final Score (AUDIT) 0 0      GAD-7    Flowsheet Row Video Visit from 03/29/2023 in [facility] Clinical Support from 01/19/2023 in [facility] Office Visit from 10/13/2022 in [facility]  Total GAD-7 Score 6 8 2       PHQ2-9    Flowsheet Row Video Visit from 03/29/2023 in [facility] Office Visit from 02/14/2023 in [facility] [place] Clinical Support from 01/19/2023 in [facility] Office Visit from 10/13/2022 in [facility] Office Visit from 09/30/2022 in [facility] Of [place]  PHQ-2 Total Score 0 0 4 0 1  PHQ-9 Total Score 4 -- 9 2 --      Flowsheet Row ED from 02/05/2023 in [facility] Emergency Department at [facility] ED from 01/21/2023 in [facility] Office Visit from 10/13/2022 in [facility]  C-SSRS RISK CATEGORY No Risk No Risk No Risk        Assessment and Plan: Patient informed Clinical research associate that  overall he is doing well.   No medication changes made.  Patient agreeable to continue medication as prescribed.  1. Schizoaffective disorder, depressive type (HCC)  Continue- traZODone (DESYREL) 100 MG tablet; Take 1 tablet (100 mg total) by mouth at bedtime as needed and may repeat dose one time if needed for sleep.  Dispense: 30 tablet; Refill: 3 Continue- paliperidone (INVEGA SUSTENNA) 156 MG/ML SUSY injection; Inject 1 mL (156 mg total) into the muscle every 28 (twenty-eight) days.  Dispense: 1 mL; Refill: 11 Continue- mirtazapine (REMERON SOL-TAB) 15 MG disintegrating tablet; Take 1 tablet (15 mg total) by mouth at bedtime.  Dispense: 30 tablet; Refill: 3  2. Tobacco dependence  Continue- nicotine (NICODERM CQ) 21 mg/24hr patch; Place 1 patch (21 mg total) onto the skin daily.  Dispense: 90 patch; Refill: 3   Collaboration of Care: Collaboration of Care: Other provider involved in patient's care AEB PCP  Patient/Guardian was advised Release of Information must be obtained prior to any record release in order to collaborate their care with an outside provider. Patient/Guardian was advised if they have not already done so to contact the registration department to sign all necessary forms in order for [place] to release information regarding their care.   Consent: Patient/Guardian gives verbal consent for treatment and assignment of benefits for services provided during this visit. Patient/Guardian expressed understanding and agreed to proceed.   Follow up in 2.5 months Shanna Cisco, NP 03/29/2023, 8:45 AM

## 2023-04-20 ENCOUNTER — Ambulatory Visit (HOSPITAL_COMMUNITY): Payer: Medicare HMO

## 2023-04-20 ENCOUNTER — Encounter (HOSPITAL_COMMUNITY): Payer: Self-pay

## 2023-04-20 ENCOUNTER — Other Ambulatory Visit: Payer: Self-pay

## 2023-04-20 VITALS — BP 132/81 | HR 94 | Ht 69.0 in | Wt 132.0 lb

## 2023-04-20 DIAGNOSIS — F251 Schizoaffective disorder, depressive type: Secondary | ICD-10-CM | POA: Diagnosis not present

## 2023-04-20 NOTE — Progress Notes (Cosign Needed)
Patient was present today for the injection of  INVEGA SUSTENNA 156 mG. Patient tolerated injection well. There was no concerns, patient was presented well groomed, functional, and responsive. No chronic pain, AVH, SI, HI. Injection was prepared as ordered, and administered in right deltoid.    [phone] Lot number: [id number] EXP:01-30-2024

## 2023-05-15 ENCOUNTER — Telehealth: Payer: Self-pay | Admitting: Emergency Medicine

## 2023-05-15 NOTE — Telephone Encounter (Signed)
 This pt had not been seen since he started with [place] a new pt. He is [name] but wants to schedule a PHYSICAL. If pt calls back he just need to schedule him correctly..  Thanks.

## 2023-05-16 ENCOUNTER — Encounter: Payer: Medicare HMO | Admitting: Emergency Medicine

## 2023-05-18 ENCOUNTER — Encounter (HOSPITAL_COMMUNITY): Payer: Self-pay

## 2023-05-18 ENCOUNTER — Ambulatory Visit (HOSPITAL_COMMUNITY): Payer: Medicare HMO

## 2023-05-18 VITALS — BP 118/85 | HR 92 | Temp 97.9°F | Ht 70.0 in | Wt 132.0 lb

## 2023-05-18 DIAGNOSIS — F251 Schizoaffective disorder, depressive type: Secondary | ICD-10-CM

## 2023-05-18 NOTE — Progress Notes (Cosign Needed)
Patient in today for due Invega Sustenna 156 mg/ml IM every 28 day injection.  Patient presented with appropriate affect, level and pleasant mood and denied any auditory or visual hallucinations, no suicidal or homicidal ideations, plans, intent, or means to want to harm self or others at this time.  Patient with no complaints and stated medication working well.  Reviewed all medications and Invega Sustenna 156 mg Im injection prepared as ordered and then given to patient in his left deltoid area.  Patient tolerated due injection without complaint of pain or discomfort and agreed to return in 4 weeks for next injection and to keep next appointment with Dr. Doyne Keel on 06/07/23. Patient to call if any issues prior to his next appointments.

## 2023-05-31 ENCOUNTER — Other Ambulatory Visit: Payer: Self-pay

## 2023-05-31 ENCOUNTER — Encounter: Payer: Self-pay | Admitting: Internal Medicine

## 2023-05-31 ENCOUNTER — Ambulatory Visit (INDEPENDENT_AMBULATORY_CARE_PROVIDER_SITE_OTHER): Payer: Medicare HMO | Admitting: Internal Medicine

## 2023-05-31 VITALS — BP 123/81 | HR 83 | Resp 16 | Ht 70.0 in | Wt 140.3 lb

## 2023-05-31 DIAGNOSIS — F1591 Other stimulant use, unspecified, in remission: Secondary | ICD-10-CM

## 2023-05-31 DIAGNOSIS — A539 Syphilis, unspecified: Secondary | ICD-10-CM

## 2023-05-31 DIAGNOSIS — B20 Human immunodeficiency virus [HIV] disease: Secondary | ICD-10-CM

## 2023-05-31 DIAGNOSIS — F1520 Other stimulant dependence, uncomplicated: Secondary | ICD-10-CM

## 2023-05-31 NOTE — Assessment & Plan Note (Signed)
He is doing well on his Biktarvy with no issues taking it every day.  Previous labs reviewed with him and show he is in the undetectable range.  Will verify again today with labs. Although up in 4 months

## 2023-05-31 NOTE — Progress Notes (Signed)
   Subjective:    Patient ID: ROYCE STEGMAN, male    DOB: 1995-08-31, 28 y.o.   MRN: [id number]  HPI Demontray here for follow-up of HIV. Is here for close follow-up after being out of care for a period.  He had previous methamphetamine abuse but has been clean since stopping before October.  He is on [place] and continues to do well.  No issues with getting or taking the medication.  Is gained some weight.   Review of Systems  Constitutional:  Negative for fatigue.       Objective:   Physical Exam Eyes:     General: No scleral icterus. Pulmonary:     Effort: Pulmonary effort is normal.  Neurological:     Mental Status: He is alert.   SH: remains drug free        Assessment & Plan:

## 2023-05-31 NOTE — Assessment & Plan Note (Signed)
Continues to do well remaining drug-free.  He will continue with his mental health medications and visits.

## 2023-05-31 NOTE — Assessment & Plan Note (Signed)
Follow-up RPR titer 1-4 was decreased from previous.  Will continue to monitor regularly.  Discussed with the patient

## 2023-06-01 LAB — T-HELPER CELL (CD4) - (RCID CLINIC ONLY)
CD4 % Helper T Cell: 47 % (ref 33–65)
CD4 T Cell Abs: 1149 /uL (ref 400–1790)

## 2023-06-02 LAB — HIV-1 RNA QUANT-NO REFLEX-BLD
HIV 1 RNA Quant: 36 {copies}/mL — ABNORMAL HIGH
HIV-1 RNA Quant, Log: 1.56 {Log} — ABNORMAL HIGH

## 2023-06-06 ENCOUNTER — Ambulatory Visit: Payer: Medicare HMO | Admitting: Emergency Medicine

## 2023-06-06 ENCOUNTER — Encounter: Payer: Self-pay | Admitting: Emergency Medicine

## 2023-06-06 VITALS — BP 104/76 | HR 97 | Ht 70.0 in | Wt 138.0 lb

## 2023-06-06 DIAGNOSIS — L84 Corns and callosities: Secondary | ICD-10-CM

## 2023-06-06 DIAGNOSIS — K6289 Other specified diseases of anus and rectum: Secondary | ICD-10-CM | POA: Diagnosis not present

## 2023-06-06 DIAGNOSIS — Z13220 Encounter for screening for lipoid disorders: Secondary | ICD-10-CM | POA: Diagnosis not present

## 2023-06-06 DIAGNOSIS — Z130 Encounter for screening for diseases of the blood and blood-forming organs and certain disorders involving the immune mechanism: Secondary | ICD-10-CM

## 2023-06-06 DIAGNOSIS — Z13228 Encounter for screening for other metabolic disorders: Secondary | ICD-10-CM

## 2023-06-06 DIAGNOSIS — Z0001 Encounter for general adult medical examination with abnormal findings: Secondary | ICD-10-CM

## 2023-06-06 DIAGNOSIS — Z1329 Encounter for screening for other suspected endocrine disorder: Secondary | ICD-10-CM | POA: Diagnosis not present

## 2023-06-06 LAB — COMPREHENSIVE METABOLIC PANEL
ALT: 9 U/L (ref 0–53)
AST: 18 U/L (ref 0–37)
Albumin: 4.4 g/dL (ref 3.5–5.2)
Alkaline Phosphatase: 63 U/L (ref 39–117)
BUN: 15 mg/dL (ref 6–23)
CO2: 27 meq/L (ref 19–32)
Calcium: 9.2 mg/dL (ref 8.4–10.5)
Chloride: 104 meq/L (ref 96–112)
Creatinine, Ser: 1.01 mg/dL (ref 0.40–1.50)
GFR: 102.75 mL/min (ref >60.00)
Glucose, Bld: 72 mg/dL (ref 70–99)
Potassium: 3.8 meq/L (ref 3.5–5.1)
Sodium: 139 meq/L (ref 135–145)
Total Bilirubin: 0.7 mg/dL (ref 0.2–1.2)
Total Protein: 7.2 g/dL (ref 6.0–8.3)

## 2023-06-06 LAB — CBC WITH DIFFERENTIAL/PLATELET
Basophils Absolute: 0.1 10*3/uL (ref 0.0–0.1)
Basophils Relative: 0.4 % (ref 0.0–3.0)
Eosinophils Absolute: 0.2 10*3/uL (ref 0.0–0.7)
Eosinophils Relative: 1.3 % (ref 0.0–5.0)
HCT: 41.5 % (ref 39.0–52.0)
Hemoglobin: 14 g/dL (ref 13.0–17.0)
Lymphocytes Relative: 30.1 % (ref 12.0–46.0)
Lymphs Abs: 3.8 10*3/uL (ref 0.7–4.0)
MCHC: 33.7 g/dL (ref 30.0–36.0)
MCV: 94.9 fL (ref 78.0–100.0)
Monocytes Absolute: 1.3 10*3/uL — ABNORMAL HIGH (ref 0.1–1.0)
Monocytes Relative: 10.7 % (ref 3.0–12.0)
Neutro Abs: 7.2 10*3/uL (ref 1.4–7.7)
Neutrophils Relative %: 57.5 % (ref 43.0–77.0)
Platelets: 232 10*3/uL (ref 150.0–400.0)
RBC: 4.38 Mil/uL (ref 4.22–5.81)
RDW: 13.7 % (ref 11.5–15.5)
WBC: 12.6 10*3/uL — ABNORMAL HIGH (ref 4.0–10.5)

## 2023-06-06 LAB — LIPID PANEL
Cholesterol: 151 mg/dL (ref 0–200)
HDL: 42.9 mg/dL (ref >39.00)
LDL Cholesterol: 77 mg/dL (ref 0–99)
NonHDL: 108.15
Total CHOL/HDL Ratio: 4
Triglycerides: 158 mg/dL — ABNORMAL HIGH (ref 0.0–149.0)
VLDL: 31.6 mg/dL (ref 0.0–40.0)

## 2023-06-06 LAB — HEMOGLOBIN A1C: Hgb A1c MFr Bld: 5.5 % (ref 4.6–6.5)

## 2023-06-06 NOTE — Assessment & Plan Note (Signed)
Bilateral large tender calluses Needs evaluation by podiatry Referral placed today for possible surgical treatment

## 2023-06-06 NOTE — Patient Instructions (Signed)
 Health Maintenance, Male  Adopting a healthy lifestyle and getting preventive care are important in promoting health and wellness. Ask your health care provider about:  The right schedule for you to have regular tests and exams.  Things you can do on your own to prevent diseases and keep yourself healthy.  What should I know about diet, weight, and exercise?  Eat a healthy diet    Eat a diet that includes plenty of vegetables, fruits, low-fat dairy products, and lean protein.  Do not eat a lot of foods that are high in solid fats, added sugars, or sodium.  Maintain a healthy weight  Body mass index (BMI) is a measurement that can be used to identify possible weight problems. It estimates body fat based on height and weight. Your health care provider can help determine your BMI and help you achieve or maintain a healthy weight.  Get regular exercise  Get regular exercise. This is one of the most important things you can do for your health. Most adults should:  Exercise for at least 150 minutes each week. The exercise should increase your heart rate and make you sweat (moderate-intensity exercise).  Do strengthening exercises at least twice a week. This is in addition to the moderate-intensity exercise.  Spend less time sitting. Even light physical activity can be beneficial.  Watch cholesterol and blood lipids  Have your blood tested for lipids and cholesterol at 27 years of age, then have this test every 5 years.  You may need to have your cholesterol levels checked more often if:  Your lipid or cholesterol levels are high.  You are older than 27 years of age.  You are at high risk for heart disease.  What should I know about cancer screening?  Many types of cancers can be detected early and may often be prevented. Depending on your health history and family history, you may need to have cancer screening at various ages. This may include screening for:  Colorectal cancer.  Prostate cancer.  Skin cancer.  Lung  cancer.  What should I know about heart disease, diabetes, and high blood pressure?  Blood pressure and heart disease  High blood pressure causes heart disease and increases the risk of stroke. This is more likely to develop in people who have high blood pressure readings or are overweight.  Talk with your health care provider about your target blood pressure readings.  Have your blood pressure checked:  Every 3-5 years if you are 24-52 years of age.  Every year if you are 3 years old or older.  If you are between the ages of 60 and 72 and are a current or former smoker, ask your health care provider if you should have a one-time screening for abdominal aortic aneurysm (AAA).  Diabetes  Have regular diabetes screenings. This checks your fasting blood sugar level. Have the screening done:  Once every three years after age 66 if you are at a normal weight and have a low risk for diabetes.  More often and at a younger age if you are overweight or have a high risk for diabetes.  What should I know about preventing infection?  Hepatitis B  If you have a higher risk for hepatitis B, you should be screened for this virus. Talk with your health care provider to find out if you are at risk for hepatitis B infection.  Hepatitis C  Blood testing is recommended for:  Everyone born from [birth year] through 1964.  Anyone  with known risk factors for hepatitis C.  Sexually transmitted infections (STIs)  You should be screened each year for STIs, including gonorrhea and chlamydia, if:  You are sexually active and are younger than 27 years of age.  You are older than 27 years of age and your health care provider tells you that you are at risk for this type of infection.  Your sexual activity has changed since you were last screened, and you are at increased risk for chlamydia or gonorrhea. Ask your health care provider if you are at risk.  Ask your health care provider about whether you are at high risk for HIV. Your health care provider  may recommend a prescription medicine to help prevent HIV infection. If you choose to take medicine to prevent HIV, you should first get tested for HIV. You should then be tested every 3 months for as long as you are taking the medicine.  Follow these instructions at home:  Alcohol use  Do not drink alcohol if your health care provider tells you not to drink.  If you drink alcohol:  Limit how much you have to 0-2 drinks a day.  Know how much alcohol is in your drink. In the [place] one drink equals one 12 oz bottle of beer (355 mL), one 5 oz glass of wine (148 mL), or one 1 oz glass of hard liquor (44 mL).  Lifestyle  Do not use any products that contain nicotine or tobacco. These products include cigarettes, chewing tobacco, and vaping devices, such as e-cigarettes. If you need help quitting, ask your health care provider.  Do not use street drugs.  Do not share needles.  Ask your health care provider for help if you need support or information about quitting drugs.  General instructions  Schedule regular health, dental, and eye exams.  Stay current with your vaccines.  Tell your health care provider if:  You often feel depressed.  You have ever been abused or do not feel safe at home.  Summary  Adopting a healthy lifestyle and getting preventive care are important in promoting health and wellness.  Follow your health care provider's instructions about healthy diet, exercising, and getting tested or screened for diseases.  Follow your health care provider's instructions on monitoring your cholesterol and blood pressure.  This information is not intended to replace advice given to you by your health care provider. Make sure you discuss any questions you have with your health care provider.  Document Revised: 09/07/2020 Document Reviewed: 09/07/2020  Elsevier Patient Education  2024 [employer].

## 2023-06-06 NOTE — Progress Notes (Signed)
 Devon Hernandez [place] 27 y.o.   Chief Complaint  Patient presents with   Annual Exam    Patient had a few questions about his foot pain and muscle spasm. Pt also mentioned having a bump on his rectum and wants to see if he can get a referral for this     HISTORY OF PRESENT ILLNESS: This is a 28 y.o. male here for annual exam. Also complaining of bump in the rectal area. Also complaining of bilateral feet pain and calluses. No other complaints or medical concerns today.   HPI   Prior to Admission medications   Medication Sig Start Date End Date Taking? Authorizing Provider  acetaminophen  (TYLENOL ) 500 MG tablet Take 1,000 mg by mouth every 6 (six) hours as needed (For tooth pain).   Yes [provider]  bictegravir-emtricitabine -tenofovir  AF (BIKTARVY ) 50-200-25 MG TABS tablet Take 1 tablet by mouth daily. 02/14/23  Yes Comer, Lamar ORN, MD  hydrOXYzine  (VISTARIL ) 50 MG capsule Take 50 mg by mouth at bedtime.   Yes [provider]  mirtazapine  (REMERON  SOL-TAB) 15 MG disintegrating tablet Take 1 tablet (15 mg total) by mouth at bedtime. 03/29/23  Yes Harl Regan E, NP  paliperidone  (INVEGA  SUSTENNA) 156 MG/ML SUSY injection Inject 1 mL (156 mg total) into the muscle every 28 (twenty-eight) days. 03/29/23  Yes Harl Regan E, NP  polyethylene glycol (MIRALAX  / GLYCOLAX ) 17 g packet Take 17 g by mouth daily. 09/28/22  Yes Lynnette Barter, MD  senna-docusate (SENOKOT-S) 8.6-50 MG tablet Take 1 tablet by mouth daily at 12 noon. 10/13/22  Yes Harl Regan E, NP  traZODone  (DESYREL ) 100 MG tablet Take 1 tablet (100 mg total) by mouth at bedtime as needed and may repeat dose one time if needed for sleep. 03/29/23  Yes Harl Regan E, NP  meloxicam  (MOBIC ) 15 MG tablet Take 1 tablet (15 mg total) by mouth daily. Patient not taking: Reported on 05/18/2023 02/23/23   Efrain Lamar ORN, MD  neomycin-bacitracin-polymyxin (NEOSPORIN) [phone] ointment Apply 1 Application  topically 4 (four) times daily as needed (Apply to affected area). Patient not taking: Reported on 05/18/2023    [provider]  nicotine  (NICODERM CQ ) 21 mg/24hr patch Place 1 patch (21 mg total) onto the skin daily. Patient not taking: Reported on 05/18/2023 03/29/23   Harl Regan BRAVO, NP    No Known Allergies  Patient Active Problem List   Diagnosis Date Noted   Bipolar affective disorder, current episode depressed (HCC) 09/21/2022   Borderline personality disorder (HCC) 09/21/2022   Methamphetamine use disorder, moderate, in controlled environment (HCC) 09/21/2022   Cannabis use disorder, severe, in controlled environment, dependence (HCC) 09/21/2022   Schizoaffective disorder (HCC) 09/21/2022   Schizoaffective disorder, depressive type (HCC) 09/21/2022   Depression 03/31/2021   Encounter for long-term (current) use of high-risk medication 04/22/2019   Major depressive disorder, recurrent episode, mild (HCC) 10/13/2018   Human immunodeficiency virus (HIV) disease (HCC) 07/23/2018   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    Past Medical History:  Diagnosis Date   ADHD (attention deficit hyperactivity disorder)    Allergy    Anxiety    Bipolar disorder (HCC)    Depression    Headache(784.0)    HIV (human immunodeficiency virus  infection) (HCC) 07/05/2018   Immune deficiency disorder (HCC)    Rectal abscess     Past Surgical History:  Procedure Laterality Date   TYMPANOSTOMY TUBE PLACEMENT      Social History   Socioeconomic History   Marital status: Single    Spouse name: Not on file   Number of children: Not on file   Years of education: Not on file   Highest  education level: Not on file  Occupational History   Not on file  Tobacco Use   Smoking status: Every Day    Types: Cigars   Smokeless tobacco: Never   Tobacco comments:    Smokes 2 cigars a day but trying to quit cutting back.   Vaping Use   Vaping status: Every Day  Substance and Sexual Activity   Alcohol use: Not Currently   Drug use: Yes    Frequency: 7.0 times per week    Types: Marijuana   Sexual activity: Not Currently    Comment: DECLINED CONDOMS  Other Topics Concern   Not on file  Social History Narrative   Mom currently has full custody but Vander has gone to live with his dad. Tonight was the first night that he was at dad's house. He admitted to marijuana use tonight, unclear when. Smoke exposure at home. No pets at home.   Social Drivers of Corporate Investment Banker Strain: Not on file  Food Insecurity: Not on file  Transportation Needs: Not on file  Physical Activity: Not on file  Stress: Not on file  Social Connections: Not on file  Intimate Partner Violence: Not on file    Family History  Problem Relation Age of Onset   Depression Mother    Hypertension Mother    Depression Father        Hx: PTSD   Diabetes Maternal Aunt    AVM Maternal Grandmother    Aneurysm Maternal Grandmother    Seizures Maternal Grandmother    Stomach cancer Neg Hx    Pancreatic cancer Neg Hx    Colon cancer Neg Hx    Liver disease Neg Hx    Esophageal cancer Neg Hx    Rectal cancer Neg Hx      Review of Systems  Constitutional: Negative.  Negative for chills and fever.  HENT: Negative.  Negative for congestion and sore throat.   Respiratory: Negative.  Negative for cough and shortness of breath.   Cardiovascular: Negative.  Negative for chest pain and palpitations.  Gastrointestinal:  Negative for abdominal pain, diarrhea, nausea and vomiting.  Genitourinary: Negative.  Negative for dysuria and hematuria.  Skin: Negative.  Negative for rash.  Neurological: Negative.   Negative for dizziness and headaches.  All other systems reviewed and are negative.  Today's Vitals   06/06/23 1352  BP: 104/76  Pulse: 97  TempSrc: Oral  SpO2: 97%  Weight: 138 lb (62.6 kg)  Height: 5' 10 (1.778 m)   Body mass index is 19.8 kg/m.   Physical Exam Vitals reviewed.  Constitutional:      Appearance: Normal appearance.  HENT:     Head: Normocephalic.     Right Ear: Tympanic membrane, ear canal and external ear normal.     Left Ear: Tympanic membrane, ear canal and external ear normal.     Mouth/Throat:     Mouth: Mucous membranes are moist.     Pharynx: Oropharynx is clear.  Eyes:     Extraocular Movements: Extraocular movements intact.  Conjunctiva/sclera: Conjunctivae normal.     Pupils: Pupils are equal, round, and reactive to light.  Cardiovascular:     Rate and Rhythm: Normal rate and regular rhythm.     Pulses: Normal pulses.     Heart sounds: Normal heart sounds.  Pulmonary:     Effort: Pulmonary effort is normal.     Breath sounds: Normal breath sounds.  Abdominal:     Palpations: Abdomen is soft.     Tenderness: There is no abdominal tenderness.  Genitourinary:    Rectum: Mass and anal fissure present.  Musculoskeletal:     Cervical back: No tenderness.     Comments: Feet: Large bilateral calluses  Lymphadenopathy:     Cervical: No cervical adenopathy.  Skin:    General: Skin is warm and dry.     Capillary Refill: Capillary refill takes less than 2 seconds.  Neurological:     General: No focal deficit present.     Mental Status: He is alert and oriented to person, place, and time.  Psychiatric:        Mood and Affect: Mood normal.        Behavior: Behavior normal.    ASSESSMENT & PLAN: Problem List Items Addressed This Visit       Musculoskeletal and Integument   Callus of foot   Bilateral large tender calluses Needs evaluation by podiatry Referral placed today for possible surgical treatment      Relevant Orders    Ambulatory referral to Podiatry     Other   Perianal mass   Differential diagnosis discussed Most likely abscess formation and possible fistula formation Needs evaluation by colorectal surgeon      Relevant Orders   Ambulatory referral to Colorectal Surgery   Other Visit Diagnoses       Encounter for general adult medical examination with abnormal findings    -  Primary   Relevant Orders   CBC with Differential/Platelet   Comprehensive metabolic panel   Hemoglobin A1c   Lipid panel     Screening for deficiency anemia       Relevant Orders   CBC with Differential/Platelet     Screening for lipoid disorders       Relevant Orders   Lipid panel     Screening for endocrine, metabolic and immunity disorder       Relevant Orders   Comprehensive metabolic panel   Hemoglobin A1c      Modifiable risk factors discussed with patient. Anticipatory guidance according to age provided. The following topics were also discussed: Social Determinants of Health Smoking and cardiovascular/cancer risk associated with smoking.  Smoking cessation advice given Diet and nutrition Benefits of exercise Cancer family history review Vaccinations review and recommendations Cardiovascular risk assessment Mental health including depression and anxiety Fall and accident prevention  Patient Instructions  Health Maintenance, Male Adopting a healthy lifestyle and getting preventive care are important in promoting health and wellness. Ask your health care provider about: The right schedule for you to have regular tests and exams. Things you can do on your own to prevent diseases and keep yourself healthy. What should I know about diet, weight, and exercise? Eat a healthy diet  Eat a diet that includes plenty of vegetables, fruits, low-fat dairy products, and lean protein. Do not eat a lot of foods that are high in solid fats, added sugars, or sodium. Maintain a healthy weight Body mass index (BMI)  is a measurement that can be used to identify possible  weight problems. It estimates body fat based on height and weight. Your health care provider can help determine your BMI and help you achieve or maintain a healthy weight. Get regular exercise Get regular exercise. This is one of the most important things you can do for your health. Most adults should: Exercise for at least 150 minutes each week. The exercise should increase your heart rate and make you sweat (moderate-intensity exercise). Do strengthening exercises at least twice a week. This is in addition to the moderate-intensity exercise. Spend less time sitting. Even light physical activity can be beneficial. Watch cholesterol and blood lipids Have your blood tested for lipids and cholesterol at 27 years of age, then have this test every 5 years. You may need to have your cholesterol levels checked more often if: Your lipid or cholesterol levels are high. You are older than 27 years of age. You are at high risk for heart disease. What should I know about cancer screening? Many types of cancers can be detected early and may often be prevented. Depending on your health history and family history, you may need to have cancer screening at various ages. This may include screening for: Colorectal cancer. Prostate cancer. Skin cancer. Lung cancer. What should I know about heart disease, diabetes, and high blood pressure? Blood pressure and heart disease High blood pressure causes heart disease and increases the risk of stroke. This is more likely to develop in people who have high blood pressure readings or are overweight. Talk with your health care provider about your target blood pressure readings. Have your blood pressure checked: Every 3-5 years if you are 75-68 years of age. Every year if you are 78 years old or older. If you are between the ages of 40 and 72 and are a current or former smoker, ask your health care provider if you  should have a one-time screening for abdominal aortic aneurysm (AAA). Diabetes Have regular diabetes screenings. This checks your fasting blood sugar level. Have the screening done: Once every three years after age 40 if you are at a normal weight and have a low risk for diabetes. More often and at a younger age if you are overweight or have a high risk for diabetes. What should I know about preventing infection? Hepatitis B If you have a higher risk for hepatitis B, you should be screened for this virus. Talk with your health care provider to find out if you are at risk for hepatitis B infection. Hepatitis C Blood testing is recommended for: Everyone born from [birth year] through 1964. Anyone with known risk factors for hepatitis C. Sexually transmitted infections (STIs) You should be screened each year for STIs, including gonorrhea and chlamydia, if: You are sexually active and are younger than 27 years of age. You are older than 28 years of age and your health care provider tells you that you are at risk for this type of infection. Your sexual activity has changed since you were last screened, and you are at increased risk for chlamydia or gonorrhea. Ask your health care provider if you are at risk. Ask your health care provider about whether you are at high risk for HIV. Your health care provider may recommend a prescription medicine to help prevent HIV infection. If you choose to take medicine to prevent HIV, you should first get tested for HIV. You should then be tested every 3 months for as long as you are taking the medicine. Follow these instructions at home: Alcohol use  Do not drink alcohol if your health care provider tells you not to drink. If you drink alcohol: Limit how much you have to 0-2 drinks a day. Know how much alcohol is in your drink. In the [place] one drink equals one 12 oz bottle of beer (355 mL), one 5 oz glass of wine (148 mL), or one 1 oz glass of hard liquor (44  mL). Lifestyle Do not use any products that contain nicotine  or tobacco. These products include cigarettes, chewing tobacco, and vaping devices, such as e-cigarettes. If you need help quitting, ask your health care provider. Do not use street drugs. Do not share needles. Ask your health care provider for help if you need support or information about quitting drugs. General instructions Schedule regular health, dental, and eye exams. Stay current with your vaccines. Tell your health care provider if: You often feel depressed. You have ever been abused or do not feel safe at home. Summary Adopting a healthy lifestyle and getting preventive care are important in promoting health and wellness. Follow your health care provider's instructions about healthy diet, exercising, and getting tested or screened for diseases. Follow your health care provider's instructions on monitoring your cholesterol and blood pressure. This information is not intended to replace advice given to you by your health care provider. Make sure you discuss any questions you have with your health care provider. Document Revised: 09/07/2020 Document Reviewed: 09/07/2020 Elsevier Patient Education  2024 [employer]     Emil Schaumann, MD [facility]

## 2023-06-06 NOTE — Assessment & Plan Note (Signed)
Differential diagnosis discussed Most likely abscess formation and possible fistula formation Needs evaluation by colorectal surgeon

## 2023-06-07 ENCOUNTER — Encounter (HOSPITAL_COMMUNITY): Payer: Self-pay | Admitting: Psychiatry

## 2023-06-07 ENCOUNTER — Telehealth (HOSPITAL_COMMUNITY): Payer: Medicare HMO | Admitting: Psychiatry

## 2023-06-07 ENCOUNTER — Encounter: Payer: Self-pay | Admitting: Emergency Medicine

## 2023-06-07 DIAGNOSIS — F17290 Nicotine dependence, other tobacco product, uncomplicated: Secondary | ICD-10-CM

## 2023-06-07 DIAGNOSIS — F251 Schizoaffective disorder, depressive type: Secondary | ICD-10-CM

## 2023-06-07 DIAGNOSIS — F411 Generalized anxiety disorder: Secondary | ICD-10-CM

## 2023-06-07 DIAGNOSIS — F17200 Nicotine dependence, unspecified, uncomplicated: Secondary | ICD-10-CM

## 2023-06-07 MED ORDER — NICOTINE 21 MG/24HR TD PT24
21.0000 mg | MEDICATED_PATCH | Freq: Every day | TRANSDERMAL | 3 refills | Status: DC
Start: 2023-06-07 — End: 2023-08-03

## 2023-06-07 MED ORDER — TRAZODONE HCL 100 MG PO TABS
100.0000 mg | ORAL_TABLET | Freq: Every evening | ORAL | 3 refills | Status: DC | PRN
Start: 2023-06-07 — End: 2023-09-13

## 2023-06-07 MED ORDER — HYDROXYZINE PAMOATE 50 MG PO CAPS
50.0000 mg | ORAL_CAPSULE | Freq: Every day | ORAL | 3 refills | Status: DC
Start: 2023-06-07 — End: 2023-08-03

## 2023-06-07 MED ORDER — MIRTAZAPINE 30 MG PO TBDP
30.0000 mg | ORAL_TABLET | Freq: Every day | ORAL | 3 refills | Status: DC
Start: 2023-06-07 — End: 2023-09-13

## 2023-06-07 MED ORDER — INVEGA SUSTENNA 156 MG/ML IM SUSY
156.0000 mg | PREFILLED_SYRINGE | INTRAMUSCULAR | 11 refills | Status: DC
Start: 2023-06-07 — End: 2023-07-19

## 2023-06-07 NOTE — Progress Notes (Addendum)
 BH MD/PA/NP OP Progress Note Virtual Visit via Video Note  I connected with Devon Hernandez on 06/07/23 at  2:00 PM EST by a video enabled telemedicine application and verified that I am speaking with the correct person using two identifiers.  Location: Patient: Home Provider: Clinic   I discussed the limitations of evaluation and management by telemedicine and the availability of in person appointments. The patient expressed understanding and agreed to proceed.  I provided 30 minutes of non-face-to-face time during this encounter.    06/07/2023 2:36 PM Gentle AMOL DOMANSKI  MRN:  [id number]  Chief Complaint: I have been distracted  HPI: 28 year old male seen today for follow up psychiatric evaluation. He has a psychiatric history of  Paranoid Schizophrenia, PTSD, ODD, Schizoaffective disorder, ADHD, borderline personality disorder, Meth Abuse, Suicide Attempts and 3 Psychiatric Hospitalizations (last age 43 prior to his admission at [facility] on 09/21/2022). He is currently managed on Mirtazapine  15 milligrams nightly, trazodone  100 mg nightly as needed, hydroxyzine  50 mg nightly, and Invega  156 mg monthly. He informed clinical research associate that his medications are somewhat effective in managing his psychiatric conditions.   Today he was well-groomed, pleasant, cooperative, and engaged in conversation.  He informed clinical research associate that he has been distracted.  He notes that he has also been worried about his life, his family, and his relationships.  Patient notes that he is going through recent breakup.  He informed clinical research associate that he is not coping with this the best and at times lack motivation to do things that he once enjoy.  Patient notes that his mood has been down.  Today provider contacted GAD-7 and patient scored an 15, at his last visit he scored a 6.   Provider also conducted PHQ-9 the patient scored a 15, at his last visit he scored a 4.  He endorses adequate sleep and appetite.  Patient endorses passive SI but denies  wanting to harm himself. Patient endorses passive SI but denies wanting to harm himself.  He today he denies SI/HI/VAH, mania, or paranoia.   Patient informed writer that he reduced his tobacco consumption. He reports that he smoke 1 cigarettes instead of 3 daily.  Today patient agreeable to increasing mirtazapine  15 mg to 30 mg to help manage anxiety and depression.  He will continue other medications as prescribed.  Patient asked writer if Invega  with caused him to have low moods.  Provider informed patient that generally Invega  improves mood and prevents symptoms of psychosis.  He endorsed understanding and agreed.  Provider recommended that patient see if mirtazapine  improves his mood and informed him that if it does not other medication could be trialed.  Provider also informed patient that if he would like a lower dose of Invega  it could be discussed at his next visit.  He will follow-[facility].  Patient does not have current thyroid , LFT, or prolactin level.  Provider ordered these labs.  No other concerns noted at this time.    Visit Diagnosis:    ICD-10-CM   1. Schizoaffective disorder, depressive type (HCC)  F25.1 mirtazapine  (REMERON  SOL-TAB) 30 MG disintegrating tablet    paliperidone  (INVEGA  SUSTENNA) 156 MG/ML SUSY injection    traZODone  (DESYREL ) 100 MG tablet    2. Tobacco dependence  F17.200 nicotine  (NICODERM CQ ) 21 mg/24hr patch    3. Generalized anxiety disorder  F41.1 mirtazapine  (REMERON  SOL-TAB) 30 MG disintegrating tablet    hydrOXYzine  (VISTARIL ) 50 MG capsule  Past Psychiatric History: Paranoid Schizophrenia, PTSD, ODD, Schizoaffective disorder, ADHD, borderline personality disorder, and Meth Abuse, and 4 Suicide Attempts and 3 Psychiatric Hospitalizations (last age 89 prior to his admission at [facility] on 09/21/2022)   Past Medical History:  Past Medical History:  Diagnosis Date   ADHD (attention deficit hyperactivity  disorder)    Allergy    Anxiety    Bipolar disorder (HCC)    Depression    Headache(784.0)    HIV (human immunodeficiency virus infection) (HCC) 07/05/2018   Immune deficiency disorder (HCC)    Rectal abscess     Past Surgical History:  Procedure Laterality Date   TYMPANOSTOMY TUBE PLACEMENT      Family Psychiatric History:  Mother- Depression, Father- Depression and PTSD   Family History:  Family History  Problem Relation Age of Onset   Depression Mother    Hypertension Mother    Depression Father        Hx: PTSD   Diabetes Maternal Aunt    AVM Maternal Grandmother    Aneurysm Maternal Grandmother    Seizures Maternal Grandmother    Stomach cancer Neg Hx    Pancreatic cancer Neg Hx    Colon cancer Neg Hx    Liver disease Neg Hx    Esophageal cancer Neg Hx    Rectal cancer Neg Hx     Social History:  Social History   Socioeconomic History   Marital status: Single    Spouse name: Not on file   Number of children: Not on file   Years of education: Not on file   Highest education level: Not on file  Occupational History   Not on file  Tobacco Use   Smoking status: Every Day    Types: Cigars   Smokeless tobacco: Never   Tobacco comments:    Smokes 2 cigars a day but trying to quit cutting back.   Vaping Use   Vaping status: Every Day  Substance and Sexual Activity   Alcohol use: Not Currently   Drug use: Yes    Frequency: 7.0 times per week    Types: Marijuana   Sexual activity: Not Currently    Comment: DECLINED CONDOMS  Other Topics Concern   Not on file  Social History Narrative   Mom currently has full custody but Devon Hernandez has gone to live with his dad. Tonight was the first night that he was at dad's house. He admitted to marijuana use tonight, unclear when. Smoke exposure at home. No pets at home.   Social Drivers of Corporate Investment Banker Strain: Not on file  Food Insecurity: Not on file  Transportation Needs: Not on file  Physical Activity:  Not on file  Stress: Not on file  Social Connections: Not on file    Allergies: No Known Allergies  Metabolic Disorder Labs: Lab Results  Component Value Date   HGBA1C 5.5 06/06/2023   MPG 111.15 09/21/2022   MPG 117 (H) 01/19/2013   Lab Results  Component Value Date   PROLACTIN 9.7 09/21/2022   Lab Results  Component Value Date   CHOL 151 06/06/2023   TRIG 158.0 (H) 06/06/2023   HDL 42.90 06/06/2023   CHOLHDL 4 06/06/2023   VLDL 31.6 06/06/2023   LDLCALC 77 06/06/2023   LDLCALC 68 09/21/2022   Lab Results  Component Value Date   TSH 0.487 09/21/2022   TSH 1.311 01/19/2013    Therapeutic Level Labs: No results found for: LITHIUM  No results found for: VALPROATE  No results found for: CBMZ  Current Medications: Current Outpatient Medications  Medication Sig Dispense Refill   acetaminophen  (TYLENOL ) 500 MG tablet Take 1,000 mg by mouth every 6 (six) hours as needed (For tooth pain).     bictegravir-emtricitabine -tenofovir  AF (BIKTARVY ) 50-200-25 MG TABS tablet Take 1 tablet by mouth daily. 90 tablet 3   hydrOXYzine  (VISTARIL ) 50 MG capsule Take 1 capsule (50 mg total) by mouth at bedtime. 30 capsule 3   meloxicam  (MOBIC ) 15 MG tablet Take 1 tablet (15 mg total) by mouth daily. (Patient not taking: Reported on 05/18/2023) 30 tablet 2   mirtazapine  (REMERON  SOL-TAB) 30 MG disintegrating tablet Take 1 tablet (30 mg total) by mouth at bedtime. 30 tablet 3   neomycin-bacitracin-polymyxin (NEOSPORIN) [phone] ointment Apply 1 Application topically 4 (four) times daily as needed (Apply to affected area). (Patient not taking: Reported on 05/18/2023)     nicotine  (NICODERM CQ ) 21 mg/24hr patch Place 1 patch (21 mg total) onto the skin daily. 90 patch 3   paliperidone  (INVEGA  SUSTENNA) 156 MG/ML SUSY injection Inject 1 mL (156 mg total) into the muscle every 28 (twenty-eight) days. 1 mL 11   polyethylene glycol (MIRALAX  / GLYCOLAX ) 17 g packet Take 17 g by mouth daily. 14  each 0   senna-docusate (SENOKOT-S) 8.6-50 MG tablet Take 1 tablet by mouth daily at 12 noon. 30 tablet 3   traZODone  (DESYREL ) 100 MG tablet Take 1 tablet (100 mg total) by mouth at bedtime as needed and may repeat dose one time if needed for sleep. 30 tablet 3   Current Facility-Administered Medications  Medication Dose Route Frequency Provider Last Rate Last Admin   mupirocin  ointment (BACTROBAN ) 2 %   Topical BID Sagardia, Miguel Jose, MD       paliperidone  (INVEGA  SUSTENNA) injection 156 mg  156 mg Intramuscular Q28 days Harl Regan E, NP   156 mg at 05/18/23 1410     Musculoskeletal: Strength & Muscle Tone: within normal limits and telehealth visit Gait & Station: normal, telehealth visit Patient leans: N/A  Psychiatric Specialty Exam: Review of Systems  There were no vitals taken for this visit.There is no height or weight on file to calculate BMI.  General Appearance: Well Groomed  Eye Contact:  Good  Speech:  Clear and Coherent and Normal Rate  Volume:  Normal  Mood:  Anxious and Depressed  Affect:  Appropriate and Congruent  Thought Process:  Coherent, Goal Directed, and Linear  Orientation:  Full (Time, Place, and Person)  Thought Content: WDL and Logical   Suicidal Thoughts:  Yes.  without intent/plan  Homicidal Thoughts:  No  Memory:  Immediate;   Good Recent;   Good Remote;   Good  Judgement:  Good  Insight:  Good  Psychomotor Activity:  Normal  Concentration:  Concentration: Good and Attention Span: Good  Recall:  Good  Fund of Knowledge: Good  Language: Good  Akathisia:  No  Handed:  Right  AIMS (if indicated): not done  Assets:  Communication Skills Desire for Improvement Financial Resources/Insurance Housing Intimacy Leisure Time Physical Health Social Support Vocational/Educational  ADL's:  Intact  Cognition: WNL  Sleep:  Good   Screenings: AUDIT    Flowsheet Row [facility]  Alcohol Use Disorder Identification Test Final Score (AUDIT) 0 0      GAD-7    Flowsheet Row Video Visit from  06/07/2023 in [facility] Video Visit from 03/29/2023 in [facility] Clinical Support from 01/19/2023 in [facility] Office Visit from 10/13/2022 in [facility]  Total GAD-7 Score 15 6 8 2       PHQ2-9    Flowsheet Row Video Visit from 06/07/2023 in [facility] Office Visit from 06/06/2023 in [facility] Office Visit from 05/31/2023 in [facility] [place] Video Visit from 03/29/2023 in [facility] Office Visit from 02/14/2023 in [facility] [place]  PHQ-2 Total Score 4 2 0 0 0  PHQ-9 Total Score 15 4 -- 4 --      Flowsheet Row Video Visit from 06/07/2023 in [facility] ED from 02/05/2023 in [facility] Emergency Department at [facility] ED from 01/21/2023 in [facility])  C-SSRS RISK CATEGORY Error: Q7 should not be populated when Q6 is No No Risk No Risk        Assessment and Plan: Patient endorses increased anxiety, depression, and passive SI.  He denies wanting to harm himself today.  Today patient agreeable to increasing mirtazapine  15 mg to 30 mg to help manage anxiety and depression.  He will continue other medications as prescribed.  Patient asked writer if Invega  with caused him to have low moods.  Provider informed patient that generally Invega  improves mood and prevents symptoms of psychosis.  He endorsed understanding and agreed.  Provider recommended that patient see if mirtazapine   improves his mood and informed him that if it does not other medication could be trialed.  Provider also informed patient that if he would like a lower dose of Invega  it could be discussed at his next visit.Patient does not have current thyroid , LFT, or prolactin level.  1. Schizoaffective disorder, depressive type (HCC) (Primary)  Increased- mirtazapine  (REMERON  SOL-TAB) 30 MG disintegrating tablet; Take 1 tablet (30 mg total) by mouth at bedtime.  Dispense: 30 tablet; Refill: 3 Continue- paliperidone  (INVEGA  SUSTENNA) 156 MG/ML SUSY injection; Inject 1 mL (156 mg total) into the muscle every 28 (twenty-eight) days.  Dispense: 1 mL; Refill: 11 Continue- traZODone  (DESYREL ) 100 MG tablet; Take 1 tablet (100 mg total) by mouth at bedtime as needed and may repeat dose one time if needed for sleep.  Dispense: 30 tablet; Refill: 3 - Prolactin; Future - Hepatic function panel; Future - Thyroid  Panel With TSH; Future - Thyroid  Panel With TSH - Hepatic function panel - Prolactin  2. Tobacco dependence  Continue- nicotine  (NICODERM CQ ) 21 mg/24hr patch; Place 1 patch (21 mg total) onto the skin daily.  Dispense: 90 patch; Refill: 3  3. Generalized anxiety disorder  Increased- mirtazapine  (REMERON  SOL-TAB) 30 MG disintegrating tablet; Take 1 tablet (30 mg total) by mouth at bedtime.  Dispense: 30 tablet; Refill: 3 Continue- hydrOXYzine  (VISTARIL ) 50 MG capsule; Take 1 capsule (50 mg total) by mouth at bedtime.  Dispense: 30 capsule; Refill: 3  Collaboration of Care: Collaboration of Care: Other provider involved in patient's care AEB PCP  Patient/Guardian was advised Release of Information must be obtained prior to any record release in order to collaborate their care with an outside provider. Patient/Guardian was advised if they have not already done so to contact the registration  department to sign all necessary forms in order for us  to release information regarding their care.   Consent:  Patient/Guardian gives verbal consent for treatment and assignment of benefits for services provided during this visit. Patient/Guardian expressed understanding and agreed to proceed.   Follow up in 2.5 months Zane FORBES Bach, NP 06/07/2023, 2:36 PM

## 2023-06-08 NOTE — Addendum Note (Signed)
 Addended by: Arlyne Bering on: 06/08/2023 03:23 PM   Modules accepted: Orders

## 2023-06-12 ENCOUNTER — Other Ambulatory Visit (INDEPENDENT_AMBULATORY_CARE_PROVIDER_SITE_OTHER): Payer: Medicare HMO

## 2023-06-12 DIAGNOSIS — F200 Paranoid schizophrenia: Secondary | ICD-10-CM | POA: Diagnosis not present

## 2023-06-12 DIAGNOSIS — Z79899 Other long term (current) drug therapy: Secondary | ICD-10-CM | POA: Diagnosis not present

## 2023-06-12 DIAGNOSIS — F251 Schizoaffective disorder, depressive type: Secondary | ICD-10-CM

## 2023-06-12 NOTE — Progress Notes (Signed)
 Pt presents today for Blood draw in right hand, which was tolerated successfully, with no complaints.     CMA, Ja'Bron Kitrina Maurin.

## 2023-06-12 NOTE — Addendum Note (Signed)
 Addended by: Elden Brucato, JA'BRON N on: 06/12/2023 11:25 AM   Modules accepted: Orders

## 2023-06-13 LAB — THYROID PANEL WITH TSH
Free Thyroxine Index: 2.3 (ref 1.2–4.9)
T3 Uptake Ratio: 24 % (ref 24–39)
T4, Total: 9.4 ug/dL (ref 4.5–12.0)
TSH: 1.03 u[IU]/mL (ref 0.450–4.500)

## 2023-06-13 LAB — HEPATIC FUNCTION PANEL
ALT: 12 [IU]/L (ref 0–44)
AST: 20 [IU]/L (ref 0–40)
Albumin: 4.9 g/dL (ref 4.3–5.2)
Alkaline Phosphatase: 76 [IU]/L (ref 44–121)
Bilirubin Total: 0.4 mg/dL (ref 0.0–1.2)
Bilirubin, Direct: 0.1 mg/dL (ref 0.00–0.40)
Total Protein: 7.6 g/dL (ref 6.0–8.5)

## 2023-06-13 LAB — PROLACTIN: Prolactin: 97.5 ng/mL — ABNORMAL HIGH (ref 3.6–31.5)

## 2023-06-14 ENCOUNTER — Other Ambulatory Visit (HOSPITAL_COMMUNITY): Payer: Self-pay | Admitting: Psychiatry

## 2023-06-14 DIAGNOSIS — D352 Benign neoplasm of pituitary gland: Secondary | ICD-10-CM

## 2023-06-14 NOTE — Progress Notes (Signed)
Provider called patient and discussed his recent lab results.  Provider referred patient to endocrinology to rule out pituitary adenoma.  Patient is managed on Invega which is likely the cause of his elevated prolactin level.

## 2023-06-15 ENCOUNTER — Ambulatory Visit (HOSPITAL_COMMUNITY): Payer: Medicare HMO

## 2023-06-20 ENCOUNTER — Ambulatory Visit (INDEPENDENT_AMBULATORY_CARE_PROVIDER_SITE_OTHER): Payer: Medicare HMO | Admitting: *Deleted

## 2023-06-20 ENCOUNTER — Encounter (HOSPITAL_COMMUNITY): Payer: Self-pay

## 2023-06-20 VITALS — BP 133/88 | HR 105 | Resp 16 | Ht 70.0 in | Wt 137.2 lb

## 2023-06-20 DIAGNOSIS — F251 Schizoaffective disorder, depressive type: Secondary | ICD-10-CM

## 2023-06-20 NOTE — Progress Notes (Cosign Needed)
Patient arrived for his injection of [name] 156mg  that he brought from his pharmacy. States he has no side effects and is doing well. Denies SI/HI or AV hallucinations. Injection prepared as ordered and given in Right Deltoid without issue or complaint.

## 2023-07-06 ENCOUNTER — Ambulatory Visit

## 2023-07-06 ENCOUNTER — Encounter: Payer: Self-pay | Admitting: Emergency Medicine

## 2023-07-06 ENCOUNTER — Ambulatory Visit (INDEPENDENT_AMBULATORY_CARE_PROVIDER_SITE_OTHER): Admitting: Emergency Medicine

## 2023-07-06 VITALS — BP 104/64 | HR 102 | Temp 98.9°F | Ht 70.0 in | Wt 143.0 lb

## 2023-07-06 DIAGNOSIS — M7918 Myalgia, other site: Secondary | ICD-10-CM | POA: Insufficient documentation

## 2023-07-06 DIAGNOSIS — R10A1 Flank pain, right side: Secondary | ICD-10-CM | POA: Insufficient documentation

## 2023-07-06 DIAGNOSIS — R109 Unspecified abdominal pain: Secondary | ICD-10-CM

## 2023-07-06 DIAGNOSIS — R0789 Other chest pain: Secondary | ICD-10-CM | POA: Diagnosis not present

## 2023-07-06 NOTE — Assessment & Plan Note (Signed)
 Pain management discussed. May take Tylenol and or Advil as needed for pain

## 2023-07-06 NOTE — Assessment & Plan Note (Signed)
 Clinically stable.  No red flag signs or symptoms. Some tenderness to right lower lateral rib cage Musculoskeletal in nature X-ray done today.  Images reviewed by me. No acute findings. Pain management discussed. May take Tylenol and or Advil as needed for pain

## 2023-07-06 NOTE — Progress Notes (Signed)
 Devon Hernandez 28 y.o.   Chief Complaint  Patient presents with   Flank Pain    Patient is having Right side pain. Pt states it is tender and hurts to breath. Started in early February the has been on and off since then.     HISTORY OF PRESENT ILLNESS: This is a 28 y.o. male complaining of pain to right flank area that started about 4 weeks ago without any obvious injuries No associated symptomatology No other complaints or medical concerns today.  Flank Pain Pertinent negatives include no abdominal pain, chest pain, fever or headaches.     Prior to Admission medications   Medication Sig Start Date End Date Taking? Authorizing Provider  acetaminophen (TYLENOL) 500 MG tablet Take 1,000 mg by mouth every 6 (six) hours as needed (For tooth pain).   Yes [provider]  bictegravir-emtricitabine-tenofovir AF (BIKTARVY) 50-200-25 MG TABS tablet Take 1 tablet by mouth daily. 02/14/23  Yes Comer, Belia Heman, MD  hydrOXYzine (VISTARIL) 50 MG capsule Take 1 capsule (50 mg total) by mouth at bedtime. 06/07/23  Yes Toy Cookey E, NP  mirtazapine (REMERON SOL-TAB) 30 MG disintegrating tablet Take 1 tablet (30 mg total) by mouth at bedtime. 06/07/23  Yes Toy Cookey E, NP  nicotine (NICODERM CQ) 21 mg/24hr patch Place 1 patch (21 mg total) onto the skin daily. 06/07/23  Yes Toy Cookey E, NP  paliperidone (INVEGA SUSTENNA) 156 MG/ML SUSY injection Inject 1 mL (156 mg total) into the muscle every 28 (twenty-eight) days. 06/07/23  Yes Toy Cookey E, NP  polyethylene glycol (MIRALAX / GLYCOLAX) 17 g packet Take 17 g by mouth daily. 09/28/22  Yes Park Pope, MD  senna-docusate (SENOKOT-S) 8.6-50 MG tablet Take 1 tablet by mouth daily at 12 noon. 10/13/22  Yes Toy Cookey E, NP  traZODone (DESYREL) 100 MG tablet Take 1 tablet (100 mg total) by mouth at bedtime as needed and may repeat dose one time if needed for sleep. 06/07/23  Yes Toy Cookey E, NP  meloxicam (MOBIC) 15 MG  tablet Take 1 tablet (15 mg total) by mouth daily. Patient not taking: Reported on 07/06/2023 02/23/23   Gardiner Barefoot, MD  neomycin-bacitracin-polymyxin (NEOSPORIN) [phone] ointment Apply 1 Application topically 4 (four) times daily as needed (Apply to affected area). Patient not taking: Reported on 05/18/2023    [provider]    No Known Allergies  Patient Active Problem List   Diagnosis Date Noted   Callus of foot 06/06/2023   Bipolar affective disorder, current episode depressed (HCC) 09/21/2022   Borderline personality disorder (HCC) 09/21/2022   Methamphetamine use disorder, moderate, in controlled environment (HCC) 09/21/2022   Cannabis use disorder, severe, in controlled environment, dependence (HCC) 09/21/2022   Schizoaffective disorder (HCC) 09/21/2022   Schizoaffective disorder, depressive type (HCC) 09/21/2022   Depression 03/31/2021   Encounter for long-term (current) use of high-risk medication 04/22/2019   Major depressive disorder, recurrent episode, mild (HCC) 10/13/2018   Human immunodeficiency virus (HIV) disease (HCC) 07/23/2018   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   Perianal mass 03/30/2017   Polysubstance abuse (HCC) 07/11/2013   MDD (major depressive disorder), recurrent episode, severe (HCC) 01/18/2013   ADHD (attention deficit hyperactivity disorder), combined type 01/18/2013   Conduct disorder, 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    Past Medical History:  Diagnosis Date   ADHD (attention deficit hyperactivity disorder)    Allergy    Anxiety    Bipolar disorder (HCC)    Depression  Headache(784.0)    HIV (human immunodeficiency virus infection) (HCC) 07/05/2018   Immune deficiency disorder (HCC)    Rectal abscess     Past Surgical History:  Procedure Laterality Date   TYMPANOSTOMY TUBE PLACEMENT      Social History   Socioeconomic History   Marital status: Single    Spouse name: Not on file   Number of  children: Not on file   Years of education: Not on file   Highest education level: Not on file  Occupational History   Not on file  Tobacco Use   Smoking status: Every Day    Types: Cigars   Smokeless tobacco: Never   Tobacco comments:    Smokes 2 cigars a day but trying to quit cutting back.   Vaping Use   Vaping status: Every Day  Substance and Sexual Activity   Alcohol use: Not Currently   Drug use: Yes    Frequency: 7.0 times per week    Types: Marijuana   Sexual activity: Not Currently    Comment: DECLINED CONDOMS  Other Topics Concern   Not on file  Social History Narrative   Mom currently has full custody but Albaraa has gone to live with his dad. Tonight was the first night that he was at dad's house. He admitted to marijuana use tonight, unclear when. Smoke exposure at home. No pets at home.   Social Drivers of Corporate investment banker Strain: Not on file  Food Insecurity: Not on file  Transportation Needs: Not on file  Physical Activity: Not on file  Stress: Not on file  Social Connections: Not on file  Intimate Partner Violence: Not on file    Family History  Problem Relation Age of Onset   Depression Mother    Hypertension Mother    Depression Father        Hx: PTSD   Diabetes Maternal Aunt    AVM Maternal Grandmother    Aneurysm Maternal Grandmother    Seizures Maternal Grandmother    Stomach cancer Neg Hx    Pancreatic cancer Neg Hx    Colon cancer Neg Hx    Liver disease Neg Hx    Esophageal cancer Neg Hx    Rectal cancer Neg Hx      Review of Systems  Constitutional: Negative.  Negative for chills and fever.  HENT: Negative.  Negative for congestion and sore throat.   Respiratory: Negative.  Negative for cough and shortness of breath.   Cardiovascular: Negative.  Negative for chest pain and palpitations.  Gastrointestinal:  Negative for abdominal pain, nausea and vomiting.  Genitourinary:  Positive for flank pain.  Skin: Negative.   Negative for rash.  Neurological: Negative.  Negative for dizziness and headaches.  All other systems reviewed and are negative.   Today's Vitals   07/06/23 1459  BP: 104/64  Pulse: (!) 102  Temp: 98.9 F (37.2 C)  TempSrc: Oral  SpO2: 97%  Weight: 143 lb (64.9 kg)  Height: 5\' 10"  (1.778 m)   Body mass index is 20.52 kg/m.   Physical Exam Vitals reviewed.  Constitutional:      Appearance: Normal appearance.  HENT:     Head: Normocephalic.  Eyes:     Extraocular Movements: Extraocular movements intact.  Cardiovascular:     Rate and Rhythm: Normal rate and regular rhythm.     Pulses: Normal pulses.     Heart sounds: Normal heart sounds.  Pulmonary:     Effort: Pulmonary  effort is normal.     Breath sounds: Normal breath sounds.  Chest:     Chest wall: Tenderness (My mild tenderness to right lower lateral rib cage) present.  Abdominal:     Palpations: Abdomen is soft.     Tenderness: There is no abdominal tenderness. There is no right CVA tenderness or left CVA tenderness.  Musculoskeletal:     Cervical back: No tenderness.  Lymphadenopathy:     Cervical: No cervical adenopathy.  Skin:    General: Skin is warm and dry.  Neurological:     General: No focal deficit present.     Mental Status: He is alert and oriented to person, place, and time.      ASSESSMENT & PLAN: A total of 33 minutes was spent with the patient and counseling/coordination of care regarding preparing for this visit, review of most recent office visit notes, review of most recent blood work results, review of chronic medical conditions under management, review of all medications, differential diagnosis of right flank pain and need for x-ray, review of x-ray images, pain management, prognosis, documentation, and need for follow-up  Problem List Items Addressed This Visit       Other   Right flank pain - Primary   Clinically stable.  No red flag signs or symptoms. Some tenderness to right  lower lateral rib cage Musculoskeletal in nature X-ray done today.  Images reviewed by me. No acute findings. Pain management discussed. May take Tylenol and or Advil as needed for pain      Relevant Orders   DG Chest 2 View   Musculoskeletal pain   Pain management discussed. May take Tylenol and or Advil as needed for pain      Patient Instructions  Flank Pain, Adult Flank pain is pain in your side. The flank is the area on your side between your upper belly (abdomen) and your spine. The pain may occur over a short time (acute), or it may be long-term or come back often (chronic). It may be mild or very bad. Pain in this area can be caused by many different things. Follow these instructions at home:  Drink enough fluid to keep your pee (urine) pale yellow. Rest as told by your doctor. Take over-the-counter and prescription medicines only as told by your doctor. Keep a journal to keep track of: What has caused your flank pain. What has made your flank pain feel better. Keep all follow-up visits. Contact a doctor if: Medicine does not help your pain. You have new symptoms. Your pain gets worse. Your symptoms last longer than 2-3 days. You have trouble peeing. You are peeing more often than normal. Get help right away if: You have trouble breathing. You are short of breath. Your belly hurts, or it is swollen or red. You feel like you may vomit (nauseous). You vomit. You feel faint, or you faint. You have blood in your pee. You have flank pain and a fever. These symptoms may be an emergency. Get help right away. Call your local emergency services (911 in the [place] Do not wait to see if the symptoms will go away. Do not drive yourself to the hospital. Summary Flank pain is pain in your side. The flank is the area of your side between your upper belly (abdomen) and your spine. Flank pain may occur over a short time (acute), or it may be long-term or come back often  (chronic). It may be mild or very bad. Pain in this  area can be caused by many different things. Contact your doctor if your symptoms get worse or last longer than 2-3 days. This information is not intended to replace advice given to you by your health care provider. Make sure you discuss any questions you have with your health care provider. Document Revised: 06/29/2020 Document Reviewed: 06/29/2020 Elsevier Patient Education  2024 Elsevier Inc.   Edwina Barth, MD [facility]

## 2023-07-06 NOTE — Patient Instructions (Signed)
 Flank Pain, Adult  Flank pain is pain in your side. The flank is the area on your side between your upper belly (abdomen) and your spine. The pain may occur over a short time (acute), or it may be long-term or come back often (chronic). It may be mild or very bad. Pain in this area can be caused by many different things.  Follow these instructions at home:    Drink enough fluid to keep your pee (urine) pale yellow.  Rest as told by your doctor.  Take over-the-counter and prescription medicines only as told by your doctor.  Keep a journal to keep track of:  What has caused your flank pain.  What has made your flank pain feel better.  Keep all follow-up visits.  Contact a doctor if:  Medicine does not help your pain.  You have new symptoms.  Your pain gets worse.  Your symptoms last longer than 2-3 days.  You have trouble peeing.  You are peeing more often than normal.  Get help right away if:  You have trouble breathing.  You are short of breath.  Your belly hurts, or it is swollen or red.  You feel like you may vomit (nauseous).  You vomit.  You feel faint, or you faint.  You have blood in your pee.  You have flank pain and a fever.  These symptoms may be an emergency. Get help right away. Call your local emergency services (911 in the [place]  Do not wait to see if the symptoms will go away.  Do not drive yourself to the hospital.  Summary  Flank pain is pain in your side. The flank is the area of your side between your upper belly (abdomen) and your spine.  Flank pain may occur over a short time (acute), or it may be long-term or come back often (chronic). It may be mild or very bad.  Pain in this area can be caused by many different things.  Contact your doctor if your symptoms get worse or last longer than 2-3 days.  This information is not intended to replace advice given to you by your health care provider. Make sure you discuss any questions you have with your health care provider.  Document Revised:  06/29/2020 Document Reviewed: 06/29/2020  Elsevier Patient Education  2024 [employer].

## 2023-07-09 ENCOUNTER — Encounter: Payer: Self-pay | Admitting: Emergency Medicine

## 2023-07-10 ENCOUNTER — Encounter: Payer: Self-pay | Admitting: Podiatry

## 2023-07-10 ENCOUNTER — Ambulatory Visit (INDEPENDENT_AMBULATORY_CARE_PROVIDER_SITE_OTHER): Payer: Medicare HMO | Admitting: Podiatry

## 2023-07-10 VITALS — Ht 70.0 in | Wt 143.0 lb

## 2023-07-10 DIAGNOSIS — R234 Changes in skin texture: Secondary | ICD-10-CM

## 2023-07-10 DIAGNOSIS — M7752 Other enthesopathy of left foot: Secondary | ICD-10-CM | POA: Diagnosis not present

## 2023-07-10 DIAGNOSIS — L84 Corns and callosities: Secondary | ICD-10-CM

## 2023-07-10 DIAGNOSIS — M7751 Other enthesopathy of right foot: Secondary | ICD-10-CM

## 2023-07-10 MED ORDER — KETOCONAZOLE 2 % EX CREA: 1.0000 | TOPICAL_CREAM | Freq: Every day | CUTANEOUS | 2 refills | Status: DC

## 2023-07-10 NOTE — Progress Notes (Unsigned)
 Chief Complaint  Patient presents with   Callouses    " I have a lot of pain and I used a foot soak about 1-2 weeks ago and my skin is peeling and I am okay with the cost and signed ABN to scan in the chart'    HPI: 28 y.o. male presents today with his mother, with concern of very painful calluses on the plantar aspect of both feet.  States that he used an over-the-counter type of acid wrap on his feet overnight which caused the skin on the plantar and dorsal aspect of both feet to peel.  He became concerned and called the office for evaluation.  He did pull up the product online which had an ingredient list that included lactic acid.  He notes that he does have some itching to the feet now.  Past Medical History:  Diagnosis Date   ADHD (attention deficit hyperactivity disorder)    Allergy    Anxiety    Bipolar disorder (HCC)    Depression    Headache(784.0)    HIV (human immunodeficiency virus infection) (HCC) 07/05/2018   Immune deficiency disorder (HCC)    Rectal abscess     Past Surgical History:  Procedure Laterality Date   TYMPANOSTOMY TUBE PLACEMENT     No Known Allergies  Review of Systems  Skin:  Positive for itching and rash.     Physical Exam: There are palpable pedal pulses bilateral.  There is no appreciable edema bilateral foot.  There are large hyperkeratotic lesions submet 5 bilateral.  There is also a hyperkeratotic lesion plantar medial aspect of the right hallux IPJ.  There is pain on palpation of these lesions.  No surrounding erythema is noted.  There is superficial de squamatization of the skin on the dorsal aspect of the feet and toes as well as the plantar aspect of the feet.  This is most notable along the borders of the feet.  There is no blister formation or erythema noted.  The fifth metatarsal bilateral is slightly plantarflexed in position.  There is decreased range of motion of the right first MPJ (functional hallux limitus).  No crepitus with  range of motion of the joint.  Manual muscle testing 5/5.  Epicritic sensation is intact  Assessment/Plan of Care: 1. Callus of foot   2. Capsulitis of ankle, left   3. Capsulitis of ankle, right   4. Acute desquamative eruption of skin      Meds ordered this encounter  Medications   ketoconazole (NIZORAL) 2 % cream    Sig: Apply 1 Application topically daily.    Dispense:  60 g    Refill:  2   ORTHOTICS, PODIATRY (AMBULATORY)  Discussed clinical findings with patient today.  The patient was informed that the calluses would be a noncovered service with his insurance today.  He does not have any class findings which would qualify him for this care.  There is no PVD or diabetes in his medical history.  The patient reviewed and signed the insurance waiver form today.  Once this was signed by the patient and he still requested the callus shaving, the bilateral submet 5 and right plantar hallux lesions were shaved with sterile #[id number] blade.  2 of the 3 lesions are proximal to the metatarsal phalangeal joint.  Patient noted immediate improvement of his discomfort on weightbearing.  Prescription for ketoconazole 2% cream sent to his pharmacy to begin applying once daily to the skin  until the scaling/peeling resolved.  The itching should resolve within the next 2 to 4 weeks.  Recommend custom orthotics for this patient.  This will be the best way to offload the fifth metatarsal heads as well as improve the functional hallux limitus on the right great toe joint.  This will cut down on the callus formation as well.  Patient was provided in orthotic estimate form and will call his insurance to obtain coverage information.  If you would like to proceed with the custom orthotics, he can call our office and asked to schedule an appointment with the pedorthist for custom molded orthotics.  The order for the orthotics will go ahead and be placed today.  It should be noted that his mother did lean over into  my work area while performing the shaving of the lesions to obtain a photo of the shavings in the exam chair tray so that she could text them to the patient.  My permission for taking of photos during treatment was not obtained prior to this.  Follow-up as needed   Clerance Lav, DPM, FACFAS [facility]     [address] [place], [place] [address]                Office [phone]  Fax [phone]

## 2023-07-17 ENCOUNTER — Ambulatory Visit: Payer: Self-pay | Admitting: Surgery

## 2023-07-17 DIAGNOSIS — Z720 Tobacco use: Secondary | ICD-10-CM | POA: Insufficient documentation

## 2023-07-17 DIAGNOSIS — F1910 Other psychoactive substance abuse, uncomplicated: Secondary | ICD-10-CM | POA: Diagnosis not present

## 2023-07-17 DIAGNOSIS — K603 Anal fistula, unspecified: Secondary | ICD-10-CM | POA: Diagnosis not present

## 2023-07-17 DIAGNOSIS — B20 Human immunodeficiency virus [HIV] disease: Secondary | ICD-10-CM | POA: Diagnosis not present

## 2023-07-18 ENCOUNTER — Ambulatory Visit (HOSPITAL_COMMUNITY): Payer: Medicare HMO

## 2023-07-19 ENCOUNTER — Ambulatory Visit (INDEPENDENT_AMBULATORY_CARE_PROVIDER_SITE_OTHER): Admitting: Family

## 2023-07-19 VITALS — BP 139/85 | HR 90 | Ht 70.0 in | Wt 140.0 lb

## 2023-07-19 DIAGNOSIS — F251 Schizoaffective disorder, depressive type: Secondary | ICD-10-CM | POA: Diagnosis not present

## 2023-07-19 DIAGNOSIS — F411 Generalized anxiety disorder: Secondary | ICD-10-CM

## 2023-07-19 MED ORDER — BENZTROPINE MESYLATE 0.5 MG PO TABS: 1.0000 mg | ORAL_TABLET | Freq: Every day | ORAL | 0 refills | Status: DC

## 2023-07-19 MED ORDER — PALIPERIDONE PALMITATE ER 117 MG/0.75ML IM SUSY
117.0000 mg | PREFILLED_SYRINGE | Freq: Once | INTRAMUSCULAR | Status: AC
  Administered 2023-10-19: 117 mg via INTRAMUSCULAR

## 2023-07-19 NOTE — Progress Notes (Signed)
 [facility] MD/PA/NP OP Progress Note  07/19/2023 2:20 PM Devon Hernandez  MRN:  [id number]  Chief Complaint: " Involuntary jaw movement."  Possible medication side effects  HPI: Devon Hernandez 28 year old African-American male presents for long-acting injectable.  Currently prescribed paliperidone 156 every 28 days.  He reports he recently noticed involuntary jaw movement within the past 2 weeks.  However, he attributed symptoms to a dental procedure.  AIMS=0.  Patient was offered Cogentin 0.5 mg p.o. twice daily however patient declined as he states he does not like taking p.o. medications.  Discussed consideration downward titration to paliperidone 117mg .  He reports he will follow-up with his mother to discuss further options.  States he has noticed a benefit with being on medications.  Education provided if symptoms progress she will follow-up with primary care provider immediately.  He appeared receptive to plan.  Devon Hernandez was able to contact his mother to this visit.  Explained patient's options related to initiating Cogentin and/or tapering dose.  Mother reports he has not noticed any symptoms that patient is reporting.  States he did advise her that he is no longer interested in taking long-acting injectable medication.  Patient to continue Remeron, hydroxyzine and trazodone.  Follow-up 28 days for paliperidone 117 mg, continue to monitor symptoms.  Support, encouragement and reassurance was provided   Visit Diagnosis:    ICD-10-CM   1. Schizoaffective disorder, depressive type (HCC)  F25.1     2. Generalized anxiety disorder  F41.1       Past Psychiatric History:  Past Medical History:  Past Medical History:  Diagnosis Date   ADHD (attention deficit hyperactivity disorder)    Allergy    Anxiety    Bipolar disorder (HCC)    Depression    Headache(784.0)    HIV (human immunodeficiency virus infection) (HCC) 07/05/2018   Immune deficiency disorder (HCC)    Rectal abscess     Past Surgical  History:  Procedure Laterality Date   TYMPANOSTOMY TUBE PLACEMENT      Family Psychiatric History:   Family History:  Family History  Problem Relation Age of Onset   Depression Mother    Hypertension Mother    Depression Father        Hx: PTSD   Diabetes Maternal Aunt    AVM Maternal Grandmother    Aneurysm Maternal Grandmother    Seizures Maternal Grandmother    Stomach cancer Neg Hx    Pancreatic cancer Neg Hx    Colon cancer Neg Hx    Liver disease Neg Hx    Esophageal cancer Neg Hx    Rectal cancer Neg Hx     Social History:  Social History   Socioeconomic History   Marital status: Single    Spouse name: Not on file   Number of children: Not on file   Years of education: Not on file   Highest education level: Not on file  Occupational History   Not on file  Tobacco Use   Smoking status: Every Day    Types: Cigars   Smokeless tobacco: Never   Tobacco comments:    Smokes 2 cigars a day but trying to quit cutting back.   Vaping Use   Vaping status: Every Day  Substance and Sexual Activity   Alcohol use: Not Currently   Drug use: Yes    Frequency: 7.0 times per week    Types: Marijuana   Sexual activity: Not Currently    Comment: DECLINED CONDOMS  Other Topics Concern  Not on file  Social History Narrative   Mom currently has full custody but Gaylin has gone to live with his dad. Tonight was the first night that he was at dad's house. He admitted to marijuana use tonight, unclear when. Smoke exposure at home. No pets at home.   Social Drivers of Corporate investment banker Strain: Not on file  Food Insecurity: Not on file  Transportation Needs: Not on file  Physical Activity: Not on file  Stress: Not on file  Social Connections: Not on file    Allergies: No Known Allergies  Metabolic Disorder Labs: Lab Results  Component Value Date   HGBA1C 5.5 06/06/2023   MPG 111.15 09/21/2022   MPG 117 (H) 01/19/2013   Lab Results  Component Value Date    PROLACTIN 97.5 (H) 06/12/2023   PROLACTIN 9.7 09/21/2022   Lab Results  Component Value Date   CHOL 151 06/06/2023   TRIG 158.0 (H) 06/06/2023   HDL 42.90 06/06/2023   CHOLHDL 4 06/06/2023   VLDL 31.6 06/06/2023   LDLCALC 77 06/06/2023   LDLCALC 68 09/21/2022   Lab Results  Component Value Date   TSH 1.030 06/12/2023   TSH 0.487 09/21/2022    Therapeutic Level Labs: No results found for: "LITHIUM" No results found for: "VALPROATE" No results found for: "CBMZ"  Current Medications: Current Outpatient Medications  Medication Sig Dispense Refill   acetaminophen (TYLENOL) 500 MG tablet Take 1,000 mg by mouth every 6 (six) hours as needed (For tooth pain).     bictegravir-emtricitabine-tenofovir AF (BIKTARVY) 50-200-25 MG TABS tablet Take 1 tablet by mouth daily. 90 tablet 3   hydrOXYzine (VISTARIL) 50 MG capsule Take 1 capsule (50 mg total) by mouth at bedtime. 30 capsule 3   ibuprofen (ADVIL) 800 MG tablet Take 800 mg by mouth 3 (three) times daily.     ketoconazole (NIZORAL) 2 % cream Apply 1 Application topically daily. 60 g 2   mirtazapine (REMERON SOL-TAB) 30 MG disintegrating tablet Take 1 tablet (30 mg total) by mouth at bedtime. 30 tablet 3   nicotine (NICODERM CQ) 21 mg/24hr patch Place 1 patch (21 mg total) onto the skin daily. 90 patch 3   paliperidone (INVEGA SUSTENNA) 156 MG/ML SUSY injection Inject 1 mL (156 mg total) into the muscle every 28 (twenty-eight) days. 1 mL 11   polyethylene glycol (MIRALAX / GLYCOLAX) 17 g packet Take 17 g by mouth daily. 14 each 0   senna-docusate (SENOKOT-S) 8.6-50 MG tablet Take 1 tablet by mouth daily at 12 noon. 30 tablet 3   traZODone (DESYREL) 100 MG tablet Take 1 tablet (100 mg total) by mouth at bedtime as needed and may repeat dose one time if needed for sleep. 30 tablet 3   Current Facility-Administered Medications  Medication Dose Route Frequency Provider Last Rate Last Admin   mupirocin ointment (BACTROBAN) 2 %   Topical BID  Georgina Quint, MD       paliperidone [facility] SUSTENNA) injection 156 mg  156 mg Intramuscular Q28 days Toy Cookey E, NP   156 mg at 06/20/23 1103     Musculoskeletal: Strength & Muscle Tone: within normal limits Gait & Station: normal Patient leans: N/A  Psychiatric Specialty Exam: Review of Systems  Psychiatric/Behavioral:  Positive for decreased concentration. Negative for sleep disturbance and suicidal ideas. The patient is nervous/anxious.   All other systems reviewed and are negative.   Blood pressure 139/85, pulse 90, height 5\' 10"  (1.778 m), weight 140 lb (  63.5 kg), SpO2 100%.Body mass index is 20.09 kg/m.  General Appearance: Casual  Eye Contact:  Good  Speech:  Clear and Coherent  Volume:  Normal  Mood:  Anxious  Affect:  Congruent  Thought Process:  Coherent  Orientation:  Full (Time, Place, and Person)  Thought Content: Logical   Suicidal Thoughts:  No  Homicidal Thoughts:  No  Memory:  Immediate;   Good Recent;   Good  Judgement:  Good  Insight:  Good  Psychomotor Activity:  Normal  Concentration:  Concentration: Good  Recall:  Good  Fund of Knowledge: Good  Language: Good  Akathisia:  No  Handed:  Right  AIMS (if indicated): done  Assets:  Communication Skills  ADL's:  Intact  Cognition: WNL  Sleep:  Good   Screenings: AUDIT    Flowsheet Row [facility] Ad[facility] (Discharged[facility]  Alcohol Use Disorder Identification Test Final Score (AUDIT) 0 0      GAD-7    Flowsheet Row Video Visit from 06/07/2023 in [facility] Video Visit from 03/29/2023 in [facility] Clinical Support from 01/19/2023 in [facility] Office Visit from 10/13/2022 in [facility]  Total GAD-7 Score 15 6 8 2        PHQ2-9    Flowsheet Row Office Visit from 07/06/2023 in [facility] Video Visit from 06/07/2023 in [facility] Office Visit from 06/06/2023 in [facility] [place] Office Visit from 05/31/2023 in [facility] [place] Video Visit from 03/29/2023 in [facility]  PHQ-2 Total Score 4 4 2  0 0  PHQ-9 Total Score 14 15 4  -- 4      Flowsheet Row Video Visit from 06/07/2023 in [facility] ED from 02/05/2023 in [facility] at [facility] ED from 01/21/2023 in [facility])  C-SSRS RISK CATEGORY Error: Q7 should not be populated when Q6 is No No Risk No Risk        Assessment and Plan: Jetson Pickrel 28 year old African-American male presents to long-acting injection clinic for paliperidone 156 mg every 28 days.  Reported possible medication side effects related to involuntary jaw movement.  Reports that symptoms started roughly 2 weeks ago.  Stated he noticed considerable improvement with taking medications of some apprehension with decrease in medication and/or starting a new medication at this time.  Discussed titrating to 0.17 at follow-up visit.  Will make Cogentin 0.5 mg available at bedtime.  Patient to continue to monitor symptoms.  -Discontinue paliperidone 156 mg -Initiated paliperidone 117 mg, consideration for further downward taper. -Initiated Cogentin 0.5 mg p.o. nightly  Collaboration of Care: Collaboration of Care: Medication Management AEB Continue   Patient/Guardian was advised Release of Information must be obtained prior to any record release in order to collaborate their care with an outside provider. Patient/Guardian was advised if they have not already done so to contact the registration department to sign all necessary  forms in order for [place] to release information regarding their care.   Consent: Patient/Guardian gives verbal consent for treatment and assignment of benefits for services provided during this visit. Patient/Guardian expressed understanding and agreed to proceed.  Oneta Rack, NP 07/19/2023, 2:20 PM

## 2023-07-19 NOTE — Progress Notes (Cosign Needed)
 Pt presents today for injection of invega sustenna which was administered in left deltoid with no complaints.  Patient states that his jaw was making odd involuntary movements specifically when is is eating. This was noted to Provider of the afternoon. Pt has no other concerns at this time. Pt denies all AVH, SI, and HI. Patient will return in 28 days.

## 2023-08-03 ENCOUNTER — Ambulatory Visit: Payer: Self-pay

## 2023-08-03 ENCOUNTER — Encounter (HOSPITAL_COMMUNITY): Payer: Self-pay

## 2023-08-03 ENCOUNTER — Ambulatory Visit (HOSPITAL_COMMUNITY): Admission: EM | Admit: 2023-08-03 | Discharge: 2023-08-03 | Disposition: A | Discharge status: Home / Self Care

## 2023-08-03 DIAGNOSIS — S29012A Strain of muscle and tendon of back wall of thorax, initial encounter: Secondary | ICD-10-CM | POA: Diagnosis not present

## 2023-08-03 NOTE — Telephone Encounter (Signed)
 Chief Complaint: SOB Symptoms: chest tightness Frequency: x 2 days Pertinent Negatives: Patient denies fever, dizziness, HA, vision changes Disposition: [] ED /[x] Urgent Care (no appt availability in office) / [] Appointment(In office/virtual)/ []  [facility]/ [] Home Care/ [] Refused Recommended Disposition /[] [facility]/ []  Follow-up with PCP Additional Notes: Pt reports pressure in chest and upper back upon inhalation. Pt reports he took a course of Prednisone from his mother and notes these symptoms began 2 days after completing the course. Pt notes chest tightness/pain on inspiration and mild SOB. Pt denies left arm, neck, jaw pain, vision changes, HA, nausea, dizziness. No OV available, pt advised to proceed to UC for eval/treat. Pt agrees to plan. Pt notes he will call for a ride, advised to call back if symptoms worsen or assistance is needed per EMS. This RN educated pt on home care, new-worsening symptoms, when to call back/seek emergent care. Pt verbalized understanding and agrees to plan.    Copied from CRM [id number]. Topic: Clinical - Red Word Triage >> Aug 03, 2023  4:13 PM Danika B wrote: [facility] that prompted transfer to Nurse Triage: Patient experiencing shortness of breath. Inhaling feels tight and severely painful in chest, ribcage, and back. Reason for Disposition  [1] MILD difficulty breathing (e.g., minimal/no SOB at rest, SOB with walking, pulse <100) AND [2] NEW-onset or WORSE than normal  Answer Assessment - Initial Assessment Questions 1. RESPIRATORY STATUS: "Describe your breathing?" (e.g., wheezing, shortness of breath, unable to speak, severe coughing)      SOB 2. ONSET: "When did this breathing problem begin?"      X 2 days ago 3. PATTERN "Does the difficult breathing come and go, or has it been constant since it started?"      Constant 4. SEVERITY: "How bad is your breathing?" (e.g., mild, moderate, severe)    - MILD: No SOB at rest, mild SOB  with walking, speaks normally in sentences, can lie down, no retractions, pulse < 100.    - MODERATE: SOB at rest, SOB with minimal exertion and prefers to sit, cannot lie down flat, speaks in phrases, mild retractions, audible wheezing, pulse 100-120.    - SEVERE: Very SOB at rest, speaks in single words, struggling to breathe, sitting hunched forward, retractions, pulse > 120      Moderate 5. RECURRENT SYMPTOM: "Have you had difficulty breathing before?" If Yes, ask: "When was the last time?" and "What happened that time?"      No 6. CARDIAC HISTORY: "Do you have any history of heart disease?" (e.g., heart attack, angina, bypass surgery, angioplasty)      NA 7. LUNG HISTORY: "Do you have any history of lung disease?"  (e.g., pulmonary embolus, asthma, emphysema)     NA 8. CAUSE: "What do you think is causing the breathing problem?"      Pt notes nothing new aside from recent Prednisone usage 9. OTHER SYMPTOMS: "Do you have any other symptoms? (e.g., dizziness, runny nose, cough, chest pain, fever)     None  Protocols used: Breathing Difficulty-A-AH

## 2023-08-03 NOTE — ED Triage Notes (Signed)
 Patient presenting with mid back pain radiating to the ribs bilaterally onset 2 days ago. Patient recently got done moving and  pain started then. The pain is present with deep breaths.   Prescriptions or OTC medications tried: Yes- Advil    with mild relief.   Started off with tailbone pain last month that resolved with prednisone that the Patient completed that coarse on 07/31/23.

## 2023-08-03 NOTE — Discharge Instructions (Signed)
 As discussed I think it is likely that you may have strained your while moving over the weekend.    Alternate between 400 mg ibuprofen and 650 mg of Tylenol every 4-6 hours as needed for pain.  Apply heat and do some gentle stretching to assist with pain.  Follow-up with your primary care provider or return here if her symptoms persist or worsen.

## 2023-08-03 NOTE — ED Provider Notes (Signed)
 [facility]    CSN: [id number] Arrival date & time: 08/03/23  1720      History   Chief Complaint Chief Complaint  Patient presents with   Back Pain    HPI Devon Hernandez is a 28 y.o. male.   Patient presents with bilateral mid back pain that radiates around to the front of his lower ribs.  Patient states that the pain is worse with taking a deep breath or movement.  Patient states that he recently moved from one place to another and was doing some lifting more than he normally would during this.  Patient reports taking Advil with some relief.  Patient states that he had tailbone pain about a month ago and his mother gave him some prednisone that he finished on 3/31.  Patient states the prednisone helped with his tailbone pain and denies any pelvic pain at this time.  Denies shortness of breath, chest pain, dizziness, cough, body aches, and fever.   Back Pain   Past Medical History:  Diagnosis Date   ADHD (attention deficit hyperactivity disorder)    Allergy    Anxiety    Bipolar disorder (HCC)    Depression    Headache(784.0)    HIV (human immunodeficiency virus infection) (HCC) 07/05/2018   Immune deficiency disorder [facility])    Rectal abscess     Patient Active Problem List   Diagnosis Date Noted   Right flank pain 07/06/2023   Musculoskeletal pain 07/06/2023   Callus of foot 06/06/2023   Bipolar affective disorder, current episode depressed (HCC) 09/21/2022   Borderline personality disorder (HCC) 09/21/2022   Methamphetamine use disorder, moderate, in controlled environment (HCC) 09/21/2022   Cannabis use disorder, severe, in controlled environment, dependence (HCC) 09/21/2022   Schizoaffective disorder (HCC) 09/21/2022   Schizoaffective disorder, depressive type (HCC) 09/21/2022   Depression 03/31/2021   Encounter for long-term (current) use of high-risk medication 04/22/2019   Major depressive disorder, recurrent episode, 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 Authorizing Provider  bictegravir-emtricitabine-tenofovir AF (BIKTARVY) 50-200-25 MG TABS tablet Take 1 tablet by mouth daily. 02/14/23  Yes Comer, Belia Heman, MD  mirtazapine (REMERON SOL-TAB) 30 MG disintegrating tablet Take 1 tablet (30 mg total) by mouth at bedtime. 06/07/23  Yes Toy Cookey E, NP  traZODone (DESYREL) 100 MG tablet Take 1 tablet (100 mg total) by mouth at bedtime as needed and may repeat dose one time if needed for sleep. 06/07/23  Yes Shanna Cisco, NP    Family History Family History  Problem Relation Age of Onset   Depression Mother    Hypertension Mother    Depression Father        Hx: PTSD   Diabetes Maternal Aunt    AVM Maternal Grandmother    Aneurysm Maternal Grandmother    Seizures Maternal Grandmother    Stomach cancer Neg Hx    Pancreatic cancer Neg Hx    Colon  cancer Neg Hx    Liver disease Neg Hx    Esophageal cancer Neg Hx    Rectal cancer Neg Hx     Social History Social History   Tobacco Use   Smoking status: Every Day    Types: Cigars   Smokeless tobacco: Never   Tobacco comments:    Smokes 2 cigars a day but trying to quit cutting back.   Vaping Use   Vaping status: Every Day  Substance Use Topics   Alcohol use: Not Currently   Drug use: Yes    Frequency: 7.0 times per week    Types: Marijuana     Allergies   Patient has no known  allergies.   Review of Systems Review of Systems  Musculoskeletal:  Positive for back pain.   Per HPI  Physical Exam Triage Vital Signs ED Triage Vitals  Encounter Vitals Group     BP 08/03/23 1754 120/77     Systolic BP Percentile --      Diastolic BP Percentile --      Pulse Rate 08/03/23 1754 100     Resp 08/03/23 1754 16     Temp 08/03/23 1754 98.5 F (36.9 C)     Temp Source 08/03/23 1754 Oral     SpO2 08/03/23 1754 97 %     Weight 08/03/23 1754 141 lb (64 kg)     Height 08/03/23 1754 5\' 10"  (1.778 m)     Head Circumference --      Peak Flow --      Pain Score 08/03/23 1752 6     Pain Loc --      Pain Education --      Exclude from Growth Chart --    No data found.  Updated Vital Signs BP 120/77 (BP Location: Left Arm)   Pulse 100   Temp 98.5 F (36.9 C) (Oral)   Resp 16   Ht 5\' 10"  (1.778 m)   Wt 141 lb (64 kg)   SpO2 97%   BMI 20.23 kg/m   Visual Acuity Right Eye Distance:   Left Eye Distance:   Bilateral Distance:    Right Eye Near:   Left Eye Near:    Bilateral Near:     Physical Exam Vitals and nursing note reviewed.  Constitutional:      General: He is awake. He is not in acute distress.    Appearance: Normal appearance. He is well-developed and well-groomed. He is not ill-appearing.  Cardiovascular:     Rate and Rhythm: Normal rate and regular rhythm.  Pulmonary:     Effort: Pulmonary effort is normal.     Breath sounds: Normal breath sounds.  Chest:     Chest wall: No tenderness.  Musculoskeletal:     Thoracic back: Tenderness present. No bony tenderness. Normal range of motion.  Neurological:     Mental Status: He is alert.  Psychiatric:        Behavior: Behavior is cooperative.      UC Treatments / Results  Labs (all labs ordered are listed, but only abnormal results are displayed) Labs Reviewed - No data to display  EKG   Radiology No results found.  Procedures Procedures (including critical care  time)  Medications Ordered in UC Medications - No data to display  Initial Impression / Assessment and Plan / UC Course  I have reviewed the triage vital signs and the nursing notes.  Pertinent labs & imaging results that were available during  my care of the patient were reviewed by me and considered in my medical decision making (see chart for details).     Upon assessment mild tenderness noted to bilateral thoracic back.  Nontender upon palpation to chest and anterior ribs.  Lungs clear bilaterally on auscultation.  Recommend alternating between ibuprofen and Tylenol as needed for pain.  Recommended applying heat and doing gentle stretching to assist with pain.  Discussed that pain is likely related to a strain of thoracic back continue to recently lifting more than normal during moving.  Discussed follow-up and return precautions. Final Clinical Impressions(s) / UC Diagnoses   Final diagnoses:  Strain of thoracic back region     Discharge Instructions      As discussed I think it is likely that you may have strained your while moving over the weekend.    Alternate between 400 mg ibuprofen and 650 mg of Tylenol every 4-6 hours as needed for pain.  Apply heat and do some gentle stretching to assist with pain.  Follow-up with your primary care provider or return here if her symptoms persist or worsen.   ED Prescriptions   None    PDMP not reviewed this encounter.   Wynonia Lawman A, NP 08/03/23 1815

## 2023-08-07 ENCOUNTER — Ambulatory Visit (INDEPENDENT_AMBULATORY_CARE_PROVIDER_SITE_OTHER)

## 2023-08-07 ENCOUNTER — Ambulatory Visit
Admission: EM | Admit: 2023-08-07 | Discharge: 2023-08-07 | Disposition: A | Discharge status: Home / Self Care | Attending: Family Medicine | Admitting: Family Medicine

## 2023-08-07 DIAGNOSIS — R109 Unspecified abdominal pain: Secondary | ICD-10-CM

## 2023-08-07 DIAGNOSIS — R079 Chest pain, unspecified: Secondary | ICD-10-CM | POA: Diagnosis not present

## 2023-08-07 DIAGNOSIS — I878 Other specified disorders of veins: Secondary | ICD-10-CM | POA: Diagnosis not present

## 2023-08-07 LAB — POCT URINALYSIS DIP (MANUAL ENTRY)
Bilirubin, UA: NEGATIVE
Blood, UA: NEGATIVE
Glucose, UA: NEGATIVE mg/dL
Leukocytes, UA: NEGATIVE
Nitrite, UA: NEGATIVE
Protein Ur, POC: 100 mg/dL — AB
Spec Grav, UA: 1.02 (ref 1.010–1.025)
Urobilinogen, UA: 4 U/dL — AB
pH, UA: 7 (ref 5.0–8.0)

## 2023-08-07 NOTE — ED Triage Notes (Signed)
 Here with mother. "This started with my tailbone hurting about 3 wks ago, after that went to urgent care ([address]) and was told this was probably a strain". "Now I am having pain all over with the tailbone pain going up my spine and in various area's and I feel like when I breath I can't breath". No known SOB. No cough or runny nose. Stools "loose the last 2 days". No nausea or vomiting.

## 2023-08-07 NOTE — ED Provider Notes (Signed)
 [name]    CSN: [id number] Arrival date & time: 08/07/23  1413      History   Chief Complaint Chief Complaint  Patient presents with   Fatigue   Pain    HPI Devon Hernandez is a 28 y.o. male.   Patient is here for not feeling well.  This started with tailbone pain about 3 weeks ago.  This resolved, but then had pain in the mid back, into the ribs.  Told to use tylenol/motrin.  Over the last 24-48 hrs he has had worsening pain into the mid back, and the left flank area.  Today it is much worse.  He felt warm/feverish.  He was sweating last night, with cold/hot flashes.  He is not SOB, but has increased pain to the left side/flank with deep breath.  Mild cough when he smokes cigars.  No n/v.   He has had some diarrhea.  He is eating/drinking okay, drinking more than eating.  No urinary pain/frequency.  He is taking advil/tylenol.  Iced it, then with heating pad.   He relapsed with crystal meth about 2 weeks ago. No ETOH use.       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   Right flank pain 07/06/2023   Musculoskeletal pain 07/06/2023   Callus of foot 06/06/2023   Bipolar affective disorder, current episode depressed (HCC) 09/21/2022   Borderline personality disorder (HCC) 09/21/2022   Methamphetamine abuse (HCC) 09/21/2022   Cannabis use disorder, severe, in controlled environment, dependence (HCC) 09/21/2022   Schizoaffective disorder (HCC) 09/21/2022   Schizoaffective disorder, depressive type (HCC) 09/21/2022   Depression 03/31/2021   Encounter for long-term (current) use of high-risk  medication 04/22/2019   Major depressive disorder, recurrent episode, mild (HCC) 10/13/2018   Human immunodeficiency virus (HIV) disease (HCC) 07/23/2018   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 Authorizing Provider  acetaminophen (TYLENOL) 325 MG tablet Take 650 mg by mouth every 6 (six) hours as needed.   Yes [provider]  bictegravir-emtricitabine-tenofovir AF (BIKTARVY) 50-200-25 MG TABS tablet Take 1 tablet by mouth daily. 02/14/23  Yes Comer, Belia Heman, MD  hydrOXYzine (ATARAX) 25 MG tablet Take 25 mg by mouth 3 (three) times daily. 09/27/22  Yes [provider]  ibuprofen (ADVIL) 200 MG tablet Take 600 mg by mouth every 6 (six) hours as needed.   Yes [provider]  senna-docusate (SENOKOT-S) 8.6-50 MG tablet Take 1 tablet by mouth as directed. 10/13/22  Yes [provider]  mirtazapine (REMERON SOL-TAB) 30 MG disintegrating tablet Take 1 tablet (30 mg total) by mouth  at bedtime. 06/07/23   Shanna Cisco, NP  traZODone (DESYREL) 100 MG tablet Take 1 tablet (100 mg total) by mouth at bedtime as needed and may repeat dose one time if needed for sleep. 06/07/23   Shanna Cisco, NP    Family History Family History  Problem Relation Age of Onset   Depression Mother    Hypertension Mother    Depression Father        Hx: PTSD   Diabetes Maternal Aunt    AVM Maternal Grandmother    Aneurysm Maternal Grandmother    Seizures Maternal Grandmother    Stomach cancer  Neg Hx    Pancreatic cancer Neg Hx    Colon cancer Neg Hx    Liver disease Neg Hx    Esophageal cancer Neg Hx    Rectal cancer Neg Hx     Social History Social History   Tobacco Use   Smoking status: Every Day    Types: Cigars   Smokeless tobacco: Never   Tobacco comments:    Smokes 2 cigars a day but trying to quit cutting back.   Vaping Use   Vaping status: Former  Substance Use Topics   Alcohol use: Not Currently   Drug use: Yes    Frequency: 7.0 times per week    Types: Marijuana     Allergies   Patient has no known allergies.   Review of Systems Review of Systems  Constitutional:  Positive for chills, fatigue and fever.  HENT: Negative.    Respiratory:  Positive for cough.   Cardiovascular: Negative.   Gastrointestinal:  Positive for abdominal pain and diarrhea.  Genitourinary: Negative.   Musculoskeletal:  Positive for back pain.     Physical Exam Triage Vital Signs ED Triage Vitals  Encounter Vitals Group     BP 08/07/23 1422 96/63     Systolic BP Percentile --      Diastolic BP Percentile --      Pulse Rate 08/07/23 1422 (!) 107     Resp 08/07/23 1422 18     Temp 08/07/23 1422 98.7 F (37.1 C)     Temp Source 08/07/23 1422 Oral     SpO2 08/07/23 1422 96 %     Weight 08/07/23 1426 140 lb (63.5 kg)     Height 08/07/23 1426 5\' 10"  (1.778 m)     Head Circumference --      Peak Flow --      Pain Score 08/07/23 1423 9     Pain Loc --      Pain Education --      Exclude from Growth Chart --    No data found.  Updated Vital Signs BP 96/63 (BP Location: Left Arm)   Pulse (!) 107   Temp 98.7 F (37.1 C) (Oral)   Resp 18   Ht 5\' 10"  (1.778 m)   Wt 63.5 kg   SpO2 96%   BMI 20.09 kg/m   Visual Acuity Right Eye Distance:   Left Eye Distance:   Bilateral Distance:    Right Eye Near:   Left Eye Near:    Bilateral Near:     Physical Exam Constitutional:      General: He is not in acute distress.    Appearance: Normal appearance. He is  normal weight. He is ill-appearing.     Comments: Sweating during the exam;  Answering questions appropriately  HENT:     Nose: Nose normal.  Mouth/Throat:     Mouth: Mucous membranes are moist.  Cardiovascular:     Rate and Rhythm: Normal rate and regular rhythm.  Pulmonary:     Effort: Pulmonary effort is normal.     Breath sounds: Normal breath sounds. No wheezing or rhonchi.  Abdominal:     Palpations: Abdomen is soft.     Tenderness: There is no guarding or rebound.     Comments: Generalized tenderness, worse to the epigastric, LUQ, LLQ area  Musculoskeletal:     Cervical back: Normal range of motion and neck supple. No tenderness.     Comments: No spinous tenderness;  Mild TTP to the left flank/lower rib area;  pain is much worse with movement  Lymphadenopathy:     Cervical: No cervical adenopathy.  Skin:    General: Skin is warm.  Neurological:     General: No focal deficit present.     Mental Status: He is alert.  Psychiatric:        Mood and Affect: Mood normal.     UC Treatments / Results  Labs (all labs ordered are listed, but only abnormal results are displayed) Labs Reviewed - No data to display  EKG   Radiology No results found.  Procedures Procedures (including critical care time)  Medications Ordered in UC Medications - No data to display  Initial Impression / Assessment and Plan / UC Course  I have reviewed the triage vital signs and the nursing notes.  Pertinent labs & imaging results that were available during my care of the patient were reviewed by me and considered in my medical decision making (see chart for details).  Clinical Course as of 08/07/23 1503  Mon Aug 07, 2023  1459 Urobilinogen, UA(!): 4.0 [EP]  1459 Color, UA(!): other [EP]    Clinical Course User Index [EP] Jannifer Franklin, MD   Final Clinical Impressions(s) / UC Diagnoses   Final diagnoses:  Left flank pain  Abdominal pain, unspecified abdominal location      Discharge Instructions      You were seen today for back pain and abdominal pain.  Your urine did not show infection today.  Your chest xray appears normal.  Your abdominal xray shows a large amount of stool.   Given your level of pain you should go to the ER for further evaluation and treatment.     After discussion, patient is actually feeling better at this time, pain is improved from previous.  He elects to start over the counter mirilax and a suppository.  Aware that official xray results will be later today/tomorrow. If he has worsening pain, n/v, fever/chills to go to the ER at that time.    ED Prescriptions   None    PDMP not reviewed this encounter.   Jannifer Franklin, MD 08/07/23 [phone]

## 2023-08-07 NOTE — Discharge Instructions (Signed)
 You were seen today for back pain and abdominal pain.  Your urine did not show infection today.  Your chest xray appears normal.  Your abdominal xray shows a large amount of stool.   Given your level of pain you should go to the ER for further evaluation and treatment.

## 2023-08-16 ENCOUNTER — Ambulatory Visit (HOSPITAL_COMMUNITY)

## 2023-08-17 ENCOUNTER — Ambulatory Visit (INDEPENDENT_AMBULATORY_CARE_PROVIDER_SITE_OTHER): Admitting: *Deleted

## 2023-08-17 VITALS — BP 117/76 | HR 89 | Ht 70.0 in | Wt 144.0 lb

## 2023-08-17 DIAGNOSIS — F251 Schizoaffective disorder, depressive type: Secondary | ICD-10-CM

## 2023-08-17 DIAGNOSIS — F333 Major depressive disorder, recurrent, severe with psychotic symptoms: Secondary | ICD-10-CM | POA: Diagnosis not present

## 2023-08-17 NOTE — Progress Notes (Cosign Needed)
 In for his monthly inj of Invega S 156 mg. He brings it himself from the pharmacy. 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 He denies any psychotic sx and offers no complaints when asked. Given his shot in his R DELTOID without issue. He is to return in 28 days for his next inj and has an appt with Dr Melisa Spray for med assessment in June.

## 2023-08-22 ENCOUNTER — Encounter (HOSPITAL_COMMUNITY): Payer: Medicare HMO | Admitting: Psychiatry

## 2023-08-24 ENCOUNTER — Encounter (HOSPITAL_COMMUNITY): Admitting: Psychiatry

## 2023-08-31 ENCOUNTER — Other Ambulatory Visit

## 2023-09-04 ENCOUNTER — Encounter: Payer: Self-pay | Admitting: "Endocrinology

## 2023-09-04 ENCOUNTER — Ambulatory Visit (INDEPENDENT_AMBULATORY_CARE_PROVIDER_SITE_OTHER): Admitting: "Endocrinology

## 2023-09-04 VITALS — BP 120/74 | HR 106 | Ht 70.0 in | Wt 142.5 lb

## 2023-09-04 DIAGNOSIS — E221 Hyperprolactinemia: Secondary | ICD-10-CM

## 2023-09-04 NOTE — Progress Notes (Signed)
 Outpatient Endocrinology Note Devon Newcomer, MD    JACORIUS PAGET 1995/10/05 [id number]  Referring Provider: Arlyne Bering, NP Primary Care Provider: Elvira Hammersmith, MD Reason for consultation: Subjective   Assessment & Plan  Mani was seen today for establish care.  Diagnoses and all orders for this visit:  Hyperprolactinemia (HCC) -     Prolactin   Patient has been on paliperidone  shots, and his prolactin was checked by the provider which came back high hands triggering the referral Patient's paliperidone  shot dose has been decreased from 156 mg to now 170 mg, has only been 2-3 weeks since the change in 08/2023 Patient reports headaches from time to time, reports his use size has gone up by 1 point but otherwise no significant symptoms 06/12/2023 TSH was normal with normal total T4, T3 uptake ratio and free thyroxine index, however prolactin was elevated at 97.5 for the first time No MRI done Ordered repeat levels today, if still elevated-> will follow-up with remaining pituitary blood work  Return in about 3 months (around 12/05/2023) for visit + labs before next visit, labs today.   I have reviewed current medications, nurse's notes, allergies, vital signs, past medical and surgical history, family medical history, and social history for this encounter. Counseled patient on symptoms, examination findings, lab findings, imaging results, treatment decisions and monitoring and prognosis. The patient understood the recommendations and agrees with the treatment plan. All questions regarding treatment plan were fully answered.  Devon Newcomer, MD  09/04/23   History of Present Illness HPI   Devon Hernandez is a 28 y.o. male referred by Dr. Melisa Spray for evaluation and management of hyperprolactinemia diagnosed in 06/2023.   He reports the following;  Headaches Yes, from time to time  visual blurring/ diplopia/ fields defect No Galactorrhea No gynecomastia  No  change in facial appearance  No body habitus No change in his hand, ring, hat, shoe size  Yes, shoe size went up by 1 point  hyperhidrosis No arthralgias No  fatigue Yes weight change Yes change in appetite No heat/cold intolerance Yes, hot more often change in bowel movements Yes, constipated sometimes change in muscle strength Yes changes in skin or hair No Palpitations No insomnia No tremor No  moon faces No fat pads No increased girth  No plethora hyperpigmentation No purple striae No acne No proximal muscle weakness No nausea/vomiting No lightheadedness No abdominal pain No   change in body hair No change in shaving No hot flashes No night sweats No  Family history is negative for pituitary tumor or other abnormalities concerning for MEN syndrome.   Physical Exam  BP 120/74 (BP Location: Left Arm, Patient Position: Sitting, Cuff Size: Small)   Pulse (!) 106   Ht 5\' 10"  (1.778 m)   Wt 142 lb 8 oz (64.6 kg)   SpO2 98%   BMI 20.45 kg/m    Constitutional: well developed, well nourished Head: normocephalic, atraumatic Eyes: sclera anicteric, no redness Neck: supple Chest: No galactorrhea/gynecomastia noted Lungs: normal respiratory effort Neurology: alert and oriented Skin: dry, no appreciable rashes Musculoskeletal: no appreciable defects Psychiatric: normal mood and affect   Current Medications Patient's Medications  New Prescriptions   No medications on file  Previous Medications   ACETAMINOPHEN  (TYLENOL ) 325 MG TABLET    Take 650 mg by mouth every 6 (six) hours as needed.   BICTEGRAVIR-EMTRICITABINE -TENOFOVIR  AF (BIKTARVY ) 50-200-25 MG TABS TABLET    Take 1 tablet by mouth daily.  HYDROXYZINE  (ATARAX ) 25 MG TABLET    Take 25 mg by mouth 3 (three) times daily.   IBUPROFEN  (ADVIL ) 200 MG TABLET    Take 600 mg by mouth every 6 (six) hours as needed.   MIRTAZAPINE  (REMERON  SOL-TAB) 30 MG DISINTEGRATING TABLET    Take 1 tablet (30 mg total) by  mouth at bedtime.   SENNA-DOCUSATE (SENOKOT-S) 8.6-50 MG TABLET    Take 1 tablet by mouth as directed.   TRAZODONE  (DESYREL ) 100 MG TABLET    Take 1 tablet (100 mg total) by mouth at bedtime as needed and may repeat dose one time if needed for sleep.  Modified Medications   No medications on file  Discontinued Medications   No medications on file    Allergies No Known Allergies  Past Medical History Past Medical History:  Diagnosis Date   ADHD (attention deficit hyperactivity disorder)    Allergy    Anxiety    Bipolar disorder (HCC)    Depression    Headache(784.0)    HIV (human immunodeficiency virus infection) (HCC) 07/05/2018   Immune deficiency disorder (HCC)    Rectal abscess     Past Surgical History Past Surgical History:  Procedure Laterality Date   TYMPANOSTOMY TUBE PLACEMENT      Family History family history includes AVM in his maternal grandmother; Aneurysm in his maternal grandmother; Depression in his father and mother; Diabetes in his maternal aunt; Hypertension in his mother; Seizures in his maternal grandmother.  Social History Social History   Socioeconomic History   Marital status: Single    Spouse name: Not on file   Number of children: Not on file   Years of education: Not on file   Highest education level: Not on file  Occupational History   Not on file  Tobacco Use   Smoking status: Every Day    Types: Cigars   Smokeless tobacco: Never   Tobacco comments:    Smokes 2 cigars a day but trying to quit cutting back.   Vaping Use   Vaping status: Former  Substance and Sexual Activity   Alcohol use: Not Currently   Drug use: Yes    Frequency: 7.0 times per week    Types: Marijuana   Sexual activity: Not Currently    Comment: DECLINED CONDOMS  Other Topics Concern   Not on file  Social History Narrative   Mom currently has full custody but Debbie has gone to live with his dad. Tonight was the first night that he was at dad's house. He  admitted to marijuana use tonight, unclear when. Smoke exposure at home. No pets at home.   Social Drivers of Corporate investment banker Strain: Not on file  Food Insecurity: Not on file  Transportation Needs: Not on file  Physical Activity: Not on file  Stress: Not on file  Social Connections: Not on file  Intimate Partner Violence: Not on file    Lab Results  Component Value Date   CHOL 151 06/06/2023   Lab Results  Component Value Date   HDL 42.90 06/06/2023   Lab Results  Component Value Date   LDLCALC 77 06/06/2023   Lab Results  Component Value Date   TRIG 158.0 (H) 06/06/2023   Lab Results  Component Value Date   CHOLHDL 4 06/06/2023   Lab Results  Component Value Date   CREATININE 1.01 06/06/2023   Lab Results  Component Value Date   GFR 102.75 06/06/2023  Component Value Date/Time   NA 139 06/06/2023 1425   K 3.8 06/06/2023 1425   CL 104 06/06/2023 1425   CO2 27 06/06/2023 1425   GLUCOSE 72 06/06/2023 1425   BUN 15 06/06/2023 1425   CREATININE 1.01 06/06/2023 1425   CREATININE 1.23 12/15/2021 1030   CALCIUM  9.2 06/06/2023 1425   PROT 7.6 06/12/2023 1125   ALBUMIN 4.9 06/12/2023 1125   AST 20 06/12/2023 1125   ALT 12 06/12/2023 1125   ALKPHOS 76 06/12/2023 1125   BILITOT 0.4 06/12/2023 1125   GFRNONAA >60 02/05/2023 2202   GFRNONAA 94 02/12/2020 1030   GFRAA 109 02/12/2020 1030      Latest Ref Rng & Units 06/06/2023    2:25 PM 02/05/2023   10:02 PM 09/21/2022   12:23 PM  BMP  Glucose 70 - 99 mg/dL 72  91  96   BUN 6 - 23 mg/dL 15  11  13    Creatinine 0.40 - 1.50 mg/dL 5.36  6.44  0.34   Sodium 135 - 145 mEq/L 139  136  137   Potassium 3.5 - 5.1 mEq/L 3.8  3.5  3.7   Chloride 96 - 112 mEq/L 104  99  102   CO2 19 - 32 mEq/L 27  28  24    Calcium  8.4 - 10.5 mg/dL 9.2  9.3  8.7        Component Value Date/Time   WBC 12.6 (H) 06/06/2023 1425   RBC 4.38 06/06/2023 1425   HGB 14.0 06/06/2023 1425   HGB 13.5 09/21/2022 1726   HCT 41.5  06/06/2023 1425   HCT 39.4 09/21/2022 1726   PLT 232.0 06/06/2023 1425   PLT 252 09/21/2022 1726   MCV 94.9 06/06/2023 1425   MCV 88 09/21/2022 1726   MCH 31.2 02/05/2023 1946   MCHC 33.7 06/06/2023 1425   RDW 13.7 06/06/2023 1425   RDW 12.2 09/21/2022 1726   LYMPHSABS 3.8 06/06/2023 1425   LYMPHSABS 2.1 09/21/2022 1726   MONOABS 1.3 (H) 06/06/2023 1425   EOSABS 0.2 06/06/2023 1425   EOSABS 0.1 09/21/2022 1726   BASOSABS 0.1 06/06/2023 1425   BASOSABS 0.0 09/21/2022 1726   Lab Results  Component Value Date   TSH 1.030 06/12/2023   TSH 0.487 09/21/2022   TSH 1.311 01/19/2013         Parts of this note may have been dictated using voice recognition software. There may be variances in spelling and vocabulary which are unintentional. Not all errors are proofread. Please notify the [facility] if any discrepancies are noted or if the meaning of any statement is not clear.

## 2023-09-05 LAB — PROLACTIN: Prolactin: 55.1 ng/mL — ABNORMAL HIGH (ref 2.0–18.0)

## 2023-09-06 ENCOUNTER — Other Ambulatory Visit: Payer: Self-pay | Admitting: "Endocrinology

## 2023-09-06 DIAGNOSIS — E221 Hyperprolactinemia: Secondary | ICD-10-CM

## 2023-09-12 ENCOUNTER — Telehealth (HOSPITAL_COMMUNITY): Payer: Self-pay

## 2023-09-12 NOTE — Telephone Encounter (Signed)
 Patient's prolactin level has reduced from 97.5 to 55.1 in the last 3 months.  Patient's Invega  Sustenna injection was reduced from 156 mg to 117 mg monthly.  Provider informed patient that his prolactin level may have been elevated because he is on a antipsychotic.  Provider encouraged patient to continue to follow-up with endocrinology.  Provider also recommended that patient continue his injection as it is managing his mental health appropriately.  Patient and mother endorsed understanding and agreed.  No other concerns noted at this time.

## 2023-09-13 ENCOUNTER — Telehealth (HOSPITAL_COMMUNITY): Admitting: Psychiatry

## 2023-09-13 ENCOUNTER — Ambulatory Visit (HOSPITAL_COMMUNITY)

## 2023-09-13 ENCOUNTER — Encounter (HOSPITAL_COMMUNITY): Payer: Self-pay | Admitting: Psychiatry

## 2023-09-13 DIAGNOSIS — F251 Schizoaffective disorder, depressive type: Secondary | ICD-10-CM | POA: Diagnosis not present

## 2023-09-13 DIAGNOSIS — F411 Generalized anxiety disorder: Secondary | ICD-10-CM

## 2023-09-13 MED ORDER — TRAZODONE HCL 100 MG PO TABS: 100.0000 mg | ORAL_TABLET | Freq: Every evening | ORAL | 3 refills | Status: DC | PRN

## 2023-09-13 MED ORDER — MIRTAZAPINE 30 MG PO TBDP: 30.0000 mg | ORAL_TABLET | Freq: Every day | ORAL | 3 refills | Status: DC

## 2023-09-13 MED ORDER — INVEGA SUSTENNA 117 MG/0.75ML IM SUSY: 117.0000 mg | PREFILLED_SYRINGE | INTRAMUSCULAR | 11 refills | Status: DC

## 2023-09-13 MED ORDER — HYDROXYZINE HCL 25 MG PO TABS: 25.0000 mg | ORAL_TABLET | Freq: Three times a day (TID) | ORAL | 3 refills | Status: DC

## 2023-09-13 NOTE — Progress Notes (Signed)
 BH MD/PA/NP OP Progress Note Virtual Visit via Video Note  I connected with Devon Hernandez on 09/13/23 at 12:30 PM EDT by a video enabled telemedicine application and verified that I am speaking with the correct person using two identifiers.  Location: Patient: Home Provider: Clinic   I discussed the limitations of evaluation and management by telemedicine and the availability of in person appointments. The patient expressed understanding and agreed to proceed.  I provided 30 minutes of non-face-to-face time during this encounter.    09/13/2023 10:54 AM Devon Hernandez  MRN:  [id number]  Chief Complaint: "The mirtazapine  is great"  HPI: 28 year old male seen today for follow up psychiatric evaluation. He has a psychiatric history of  Paranoid Schizophrenia, PTSD, ODD, Schizoaffective disorder, ADHD, borderline personality disorder, Meth Abuse, Suicide Attempts and 3 Psychiatric Hospitalizations (last age 43 prior to his admission at [facility] on 09/21/2022). He is currently managed on Mirtazapine  30 milligrams nightly, trazodone  100 mg nightly as needed, hydroxyzine  50 mg nightly, and Invega  117 mg monthly. He informed Clinical research associate that his medications are effective in managing his psychiatric conditions.   Today he was well-groomed, pleasant, cooperative, and engaged in conversation.  He informed Clinical research associate that mirtazapine  is great.  He reports that since starting it his appetite is improved.  He notes that he has gained 5 pounds.  Patient notes that he now weighs 145.  He also notes that his anxiety and depression is well-managed.  Since reducing Invega  from 156 mg to 117 mg reports that he continues to feel mentally stable.  Patient was recently seen by endocrinology and his prolactin level was noted to have decreased over the last 3 months.  At this time patient wishes to continue to take Invega  117 mg as it was likely the Invega  causing his elevated prolactin level.   Patient notes that his anxiety and  depression are well-managed.  Today provider contacted GAD-7 and patient scored an 5, at his last visit he scored a 15.   Provider also conducted PHQ-9 the patient scored a 2, at his last visit he scored a 15.  He endorses adequate sleep and appetite.  Today he denies SI/HI/VAH, mania, or paranoia.   At this time no medication changes were made.  Patient agreeable to continue medications as prescribed.  No other concern for at this time.    Visit Diagnosis:    ICD-10-CM   1. Schizoaffective disorder, depressive type (HCC)  F25.1 traZODone  (DESYREL ) 100 MG tablet    mirtazapine  (REMERON  SOL-TAB) 30 MG disintegrating tablet    Paliperidone  ER (INVEGA  SUSTENNA) injection    2. Generalized anxiety disorder  F41.1 mirtazapine  (REMERON  SOL-TAB) 30 MG disintegrating tablet         Past Psychiatric History: Paranoid Schizophrenia, PTSD, ODD, Schizoaffective disorder, ADHD, borderline personality disorder, and Meth Abuse, and 4 Suicide Attempts and 3 Psychiatric Hospitalizations (last age 38 prior to his admission at [facility] on 09/21/2022)   Past Medical History:  Past Medical History:  Diagnosis Date   ADHD (attention deficit hyperactivity disorder)    Allergy    Anxiety    Bipolar disorder (HCC)    Depression    Headache(784.0)    HIV (human immunodeficiency virus infection) (HCC) 07/05/2018   Immune deficiency disorder (HCC)    Rectal abscess     Past Surgical History:  Procedure Laterality Date   TYMPANOSTOMY TUBE PLACEMENT      Family Psychiatric History:  Mother- Depression, Father- Depression and PTSD   Family History:  Family History  Problem Relation Age of Onset   Depression Mother    Hypertension Mother    Depression Father        Hx: PTSD   Diabetes Maternal Aunt    AVM Maternal Grandmother    Aneurysm Maternal Grandmother    Seizures Maternal Grandmother    Stomach cancer Neg Hx    Pancreatic cancer Neg Hx    Colon cancer Neg Hx    Liver disease Neg Hx     Esophageal cancer Neg Hx    Rectal cancer Neg Hx     Social History:  Social History   Socioeconomic History   Marital status: Single    Spouse name: Not on file   Number of children: Not on file   Years of education: Not on file   Highest education level: Not on file  Occupational History   Not on file  Tobacco Use   Smoking status: Every Day    Types: Cigars   Smokeless tobacco: Never   Tobacco comments:    Smokes 2 cigars a day but trying to quit cutting back.   Vaping Use   Vaping status: Former  Substance and Sexual Activity   Alcohol use: Not Currently   Drug use: Yes    Frequency: 7.0 times per week    Types: Marijuana   Sexual activity: Not Currently    Comment: DECLINED CONDOMS  Other Topics Concern   Not on file  Social History Narrative   Mom currently has full custody but Emmett has gone to live with his dad. Tonight was the first night that he was at dad's house. He admitted to marijuana use tonight, unclear when. Smoke exposure at home. No pets at home.   Social Drivers of Corporate investment banker Strain: Not on file  Food Insecurity: Not on file  Transportation Needs: Not on file  Physical Activity: Not on file  Stress: Not on file  Social Connections: Not on file    Allergies: No Known Allergies  Metabolic Disorder Labs: Lab Results  Component Value Date   HGBA1C 5.5 06/06/2023   MPG 111.15 09/21/2022   MPG 117 (H) 01/19/2013   Lab Results  Component Value Date   PROLACTIN 55.1 (H) 09/04/2023   PROLACTIN 97.5 (H) 06/12/2023   Lab Results  Component Value Date   CHOL 151 06/06/2023   TRIG 158.0 (H) 06/06/2023   HDL 42.90 06/06/2023   CHOLHDL 4 06/06/2023   VLDL 31.6 06/06/2023   LDLCALC 77 06/06/2023   LDLCALC 68 09/21/2022   Lab Results  Component Value Date   TSH 1.030 06/12/2023   TSH 0.487 09/21/2022    Therapeutic Level Labs: No results found for: "LITHIUM " No results found for: "VALPROATE" No results found for:  "CBMZ"  Current Medications: Current Outpatient Medications  Medication Sig Dispense Refill   Paliperidone  ER (INVEGA  SUSTENNA) injection Inject 117 mg into the muscle every 28 (twenty-eight) days. 0.9 mL 11   acetaminophen  (TYLENOL ) 325 MG tablet Take 650 mg by mouth every 6 (six) hours as needed.     bictegravir-emtricitabine -tenofovir  AF (BIKTARVY ) 50-200-25 MG TABS tablet Take 1 tablet by mouth daily. 90 tablet 3   hydrOXYzine  (ATARAX ) 25 MG tablet Take 1 tablet (25 mg total) by mouth 3 (three) times daily. 90 tablet 3   ibuprofen  (ADVIL ) 200 MG tablet Take 600 mg by mouth every 6 (six) hours as needed.     mirtazapine  (REMERON  SOL-TAB) 30 MG disintegrating tablet Take 1  tablet (30 mg total) by mouth at bedtime. 30 tablet 3   senna-docusate (SENOKOT-S) 8.6-50 MG tablet Take 1 tablet by mouth as directed.     traZODone  (DESYREL ) 100 MG tablet Take 1 tablet (100 mg total) by mouth at bedtime as needed and may repeat dose one time if needed for sleep. 30 tablet 3   Current Facility-Administered Medications  Medication Dose Route Frequency Provider Last Rate Last Admin   mupirocin  ointment (BACTROBAN ) 2 %   Topical BID Sagardia, Miguel Jose, MD       Paliperidone  ER (INVEGA  SUSTENNA) injection 117 mg  117 mg Intramuscular Once          Musculoskeletal: Strength & Muscle Tone: within normal limits and telehealth visit Gait & Station: normal, telehealth visit Patient leans: N/A  Psychiatric Specialty Exam: Review of Systems  There were no vitals taken for this visit.There is no height or weight on file to calculate BMI.  General Appearance: Well Groomed  Eye Contact:  Good  Speech:  Clear and Coherent and Normal Rate  Volume:  Normal  Mood:  Euthymic  Affect:  Appropriate and Congruent  Thought Process:  Coherent, Goal Directed, and Linear  Orientation:  Full (Time, Place, and Person)  Thought Content: WDL and Logical   Suicidal Thoughts:  No  Homicidal Thoughts:  No  Memory:   Immediate;   Good Recent;   Good Remote;   Good  Judgement:  Good  Insight:  Good  Psychomotor Activity:  Normal  Concentration:  Concentration: Good and Attention Span: Good  Recall:  Good  Fund of Knowledge: Good  Language: Good  Akathisia:  No  Handed:  Right  AIMS (if indicated): not done  Assets:  Communication Skills Desire for Improvement Financial Resources/Insurance Housing Intimacy Leisure Time Physical Health Social Support Vocational/Educational  ADL's:  Intact  Cognition: WNL  Sleep:  Good   Screenings: AUDIT    Flowsheet Row [facility] Ad[facility] ([facility]  Alcohol Use Disorder Identification Test Final Score (AUDIT) 0 0      GAD-7    Flowsheet Row Video Visit from 09/13/2023 in [facility] Video Visit from 06/07/2023 in [facility] Video Visit from 03/29/2023 in [facility] Clinical Support from 01/19/2023 in [facility] Office Visit from 10/13/2022 in [facility]  Total GAD-7 Score 5 15 6 8 2       PHQ2-9    Flowsheet Row Video Visit from 09/13/2023 in [facility] Office Visit from 07/06/2023 in [facility] Video Visit from 06/07/2023 in [facility] Office Visit from 06/06/2023 in [facility] Office Visit from 05/31/2023 in [facility] [place]  PHQ-2 Total Score 1 4 4 2  0  PHQ-9 Total Score 2 14 15 4  --      Flowsheet Row Video Visit from 09/13/2023 in [facility] UC from 08/07/2023 in [facility] UC from 08/03/2023 in [facility]  Urgent Care at [facility] RISK CATEGORY Error: Q3, 4, or 5 should not be populated when Q2 is No Moderate Risk No Risk        Assessment and Plan: Patient  reports that his mood, anxiety, depression, and sleep are well-managed.At this time no medication changes were made.  Patient agreeable to continue medications as prescribed.    1. Schizoaffective disorder, depressive type (HCC)  Continue- traZODone  (DESYREL ) 100 MG tablet; Take 1 tablet (100 mg total) by mouth at bedtime as needed and may repeat dose one time if needed for sleep.  Dispense: 30 tablet; Refill: 3 Continue- mirtazapine  (REMERON  SOL-TAB) 30 MG disintegrating tablet; Take 1 tablet (30 mg total) by mouth at bedtime.  Dispense: 30 tablet; Refill: 3 Continue- Paliperidone  ER (INVEGA  SUSTENNA) injection; Inject 117 mg into the muscle every 28 (twenty-eight) days.  Dispense: 0.9 mL; Refill: 11  2. Generalized anxiety disorder  Continue- mirtazapine  (REMERON  SOL-TAB) 30 MG disintegrating tablet; Take 1 tablet (30 mg total) by mouth at bedtime.  Dispense: 30 tablet; Refill: 3  Collaboration of Care: Collaboration of Care: Other provider involved in patient's care AEB PCP  Patient/Guardian was advised Release of Information must be obtained prior to any record release in order to collaborate their care with an outside provider. Patient/Guardian was advised if they have not already done so to contact the registration department to sign all necessary forms in order for us  to release information regarding their care.   Consent: Patient/Guardian gives verbal consent for treatment and assignment of benefits for services provided during this visit. Patient/Guardian expressed understanding and agreed to proceed.   Follow up in 2.5 months Arlyne Bering, NP 09/13/2023, 10:54 AM

## 2023-09-14 ENCOUNTER — Encounter (HOSPITAL_COMMUNITY): Admitting: Psychiatry

## 2023-09-14 ENCOUNTER — Other Ambulatory Visit: Payer: Self-pay | Admitting: Surgery

## 2023-09-14 ENCOUNTER — Ambulatory Visit (HOSPITAL_COMMUNITY)

## 2023-09-14 DIAGNOSIS — K642 Third degree hemorrhoids: Secondary | ICD-10-CM | POA: Diagnosis not present

## 2023-09-14 DIAGNOSIS — K641 Second degree hemorrhoids: Secondary | ICD-10-CM | POA: Diagnosis not present

## 2023-09-14 DIAGNOSIS — K603 Anal fistula, unspecified: Secondary | ICD-10-CM | POA: Diagnosis not present

## 2023-09-14 DIAGNOSIS — K60511 Anorectal fistula, simple, initial: Secondary | ICD-10-CM | POA: Diagnosis not present

## 2023-09-14 DIAGNOSIS — K60321 Anal fistula, complex, initial: Secondary | ICD-10-CM | POA: Diagnosis not present

## 2023-09-15 ENCOUNTER — Ambulatory Visit: Payer: Self-pay

## 2023-09-15 LAB — SURGICAL PATHOLOGY

## 2023-09-21 ENCOUNTER — Ambulatory Visit (HOSPITAL_COMMUNITY)

## 2023-09-21 VITALS — BP 118/74 | HR 90 | Ht 70.0 in | Wt 138.0 lb

## 2023-09-21 DIAGNOSIS — F333 Major depressive disorder, recurrent, severe with psychotic symptoms: Secondary | ICD-10-CM

## 2023-09-21 MED ORDER — PALIPERIDONE PALMITATE ER 117 MG/0.75ML IM SUSY
117.0000 mg | PREFILLED_SYRINGE | Freq: Once | INTRAMUSCULAR | Status: AC
  Administered 2023-09-21: 117 mg via INTRAMUSCULAR

## 2023-09-21 NOTE — Progress Notes (Cosign Needed)
 Pt presents today for injection of invega  sustenna given in left deltoid.Pt tolerated injection with no complaints.    Pt states that he had surgery in his leg which he is currently trying to heal ATM. Pt states that he is doing good on this new does of medicine invega  117.075 mg which was administered in patients left deltoid.

## 2023-09-26 ENCOUNTER — Ambulatory Visit: Admitting: Infectious Diseases

## 2023-09-26 ENCOUNTER — Ambulatory Visit: Payer: Medicare HMO | Admitting: Internal Medicine

## 2023-10-09 ENCOUNTER — Ambulatory Visit (INDEPENDENT_AMBULATORY_CARE_PROVIDER_SITE_OTHER): Admitting: Infectious Diseases

## 2023-10-09 ENCOUNTER — Encounter: Payer: Self-pay | Admitting: Infectious Diseases

## 2023-10-09 ENCOUNTER — Other Ambulatory Visit: Payer: Self-pay

## 2023-10-09 VITALS — BP 131/81 | HR 103 | Temp 97.7°F | Ht 70.0 in | Wt 147.0 lb

## 2023-10-09 DIAGNOSIS — Z0000 Encounter for general adult medical examination without abnormal findings: Secondary | ICD-10-CM | POA: Diagnosis not present

## 2023-10-09 DIAGNOSIS — Z113 Encounter for screening for infections with a predominantly sexual mode of transmission: Secondary | ICD-10-CM | POA: Diagnosis not present

## 2023-10-09 DIAGNOSIS — Z79899 Other long term (current) drug therapy: Secondary | ICD-10-CM | POA: Insufficient documentation

## 2023-10-09 DIAGNOSIS — B20 Human immunodeficiency virus [HIV] disease: Secondary | ICD-10-CM

## 2023-10-09 LAB — CBC
HCT: 42.4 % (ref 38.5–50.0)
Hemoglobin: 14.5 g/dL (ref 13.2–17.1)
MCH: 31.3 pg (ref 27.0–33.0)
MCHC: 34.2 g/dL (ref 32.0–36.0)
MCV: 91.4 fL (ref 80.0–100.0)
MPV: 9.6 fL (ref 7.5–12.5)
Platelets: 251 10*3/uL (ref 140–400)
RBC: 4.64 10*6/uL (ref 4.20–5.80)
RDW: 13.6 % (ref 11.0–15.0)
WBC: 10.6 10*3/uL (ref 3.8–10.8)

## 2023-10-09 MED ORDER — BIKTARVY 50-200-25 MG PO TABS: 1.0000 | ORAL_TABLET | Freq: Every day | ORAL | 3 refills | Status: DC

## 2023-10-09 NOTE — Progress Notes (Unsigned)
 [address], [place], [place], [address]                                                                  Phn. [phone]; Fax: [phone]                                                                             Date: 10/09/23   Reason for Visit: Routine HIV care.  HPI: Devon Hernandez is a 28 y.o.old male with a history of anxiety/depression/adhd/bipolar d/o and HIV who is here for follow up. Last seen by Dr  Seymour Dapper on 05/31/23.   Accompanied with mother. Reports compliance with Biktarvy  with no missed doses or concerns.  Seen by Podiatry on 3/10. Seen in the UC on 4/3 for strain of thoracic back and 4/7 for left back pain. He reports having surgery on May 15th for the removal of an anal lesion, with an uneventful recovery. He is scheduled for a follow-up on June 16th. Last seen by PCP on 3/6. He reports being closely followed with Psychiatry with stable mental health. Seen by Endocrinology on 5/5 for hyperprolactinemia. Reports occasional alcohol consumption, marijuana use, and tobacco smoking. He reports being sexually active with male partners in the past, though not very active recently. He lives alone and is currently disabled. No other complaints.   ROS: As stated in above HPI; all other systems were reviewed and are otherwise negative unless noted below  No reported fever / chills, night sweats, unintentional weight loss, acute visual change, odynophagia, chest pain/pressure, new or worsened SOB or WOB, nausea, vomiting, diarrhea, dysuria, GU discharge, syncope, seizures, red/hot swollen joints, hallucinations / delusions, rashes, new allergies, unusual / excessive bleeding, swollen lymph nodes.  PMH/ PSH/ FamHx / Social Hx , medications and allergies reviewed and updated as appropriate;  please see corresponding tab in EHR / prior notes                             Current Outpatient Medications on File Prior to Visit  Medication Sig Dispense Refill   acetaminophen  (TYLENOL ) 325 MG tablet Take 650 mg by mouth every 6 (six) hours as needed.     hydrOXYzine  (ATARAX ) 25 MG tablet Take 1 tablet (25 mg total) by mouth 3 (three) times daily. 90 tablet 3   ibuprofen  (ADVIL ) 200 MG tablet Take 600 mg by mouth every 6 (six) hours as needed.     mirtazapine  (REMERON  SOL-TAB) 30 MG disintegrating tablet Take 1 tablet (30 mg total) by mouth at bedtime. 30 tablet 3   Paliperidone  ER (  INVEGA  SUSTENNA) injection Inject 117 mg into the muscle every 28 (twenty-eight) days. 0.9 mL 11   senna-docusate (SENOKOT-S) 8.6-50 MG tablet Take 1 tablet by mouth as directed.     traZODone  (DESYREL ) 100 MG tablet Take 1 tablet (100 mg total) by mouth at bedtime as needed and may repeat dose one time if needed for sleep. 30 tablet 3   Current Facility-Administered Medications on File Prior to Visit  Medication Dose Route Frequency Provider Last Rate Last Admin   mupirocin  ointment (BACTROBAN ) 2 %   Topical BID Elvira Hammersmith, MD       Paliperidone  ER (INVEGA  SUSTENNA) injection 117 mg  117 mg Intramuscular Once        No Known Allergies  Past Medical History:  Diagnosis Date   ADHD (attention deficit hyperactivity disorder)    Allergy    Anxiety    Bipolar disorder (HCC)    Depression    Headache(784.0)    HIV (human immunodeficiency virus infection) (HCC) 07/05/2018   Immune deficiency disorder (HCC)    Rectal abscess    Past Surgical History:  Procedure Laterality Date   TYMPANOSTOMY TUBE PLACEMENT     Social History   Socioeconomic History   Marital status: Single    Spouse name: Not on file   Number of children: Not on file   Years of education: Not on file   Highest education level: Not on file  Occupational History   Not on file  Tobacco Use   Smoking status: Every Day     Types: Cigars   Smokeless tobacco: Never   Tobacco comments:    Smokes 2 cigars a day but trying to quit cutting back.   Vaping Use   Vaping status: Former  Substance and Sexual Activity   Alcohol use: Not Currently   Drug use: Yes    Frequency: 7.0 times per week    Types: Marijuana   Sexual activity: Not Currently    Comment: DECLINED CONDOMS  Other Topics Concern   Not on file  Social History Narrative   Mom currently has full custody but Shonta has gone to live with his dad. Tonight was the first night that he was at dad's house. He admitted to marijuana use tonight, unclear when. Smoke exposure at home. No pets at home.   Social Drivers of Corporate investment banker Strain: Not on file  Food Insecurity: Not on file  Transportation Needs: Not on file  Physical Activity: Not on file  Stress: Not on file  Social Connections: Not on file  Intimate Partner Violence: Not on file   Family History  Problem Relation Age of Onset   Depression Mother    Hypertension Mother    Depression Father        Hx: PTSD   Diabetes Maternal Aunt    AVM Maternal Grandmother    Aneurysm Maternal Grandmother    Seizures Maternal Grandmother    Stomach cancer Neg Hx    Pancreatic cancer Neg Hx    Colon cancer Neg Hx    Liver disease Neg Hx    Esophageal cancer Neg Hx    Rectal cancer Neg Hx    Vitals BP 131/81   Pulse (!) 103   Temp 97.7 F (36.5 C) (Temporal)   Ht 5' 10 (1.778 m)   Wt 147 lb (66.7 kg)   SpO2 99%   BMI 21.09 kg/m    Examination  Gen: no acute distress HEENT: [place]/AT, no  scleral icterus, no pale conjunctivae, hearing normal, oral mucosa moist Neck: Supple Cardio: Regular rate and rhythm Resp: Pulmonary effort normal in room air GI: nondistended GU: Musc: Extremities: No pedal edema Skin: No rashes Neuro: grossly non focal , awake, alert and oriented * 3  Psych: Calm, cooperative  Lab Results HIV 1 RNA Quant  Date Value  05/31/2023 36 Copies/mL (H)   02/14/2023 31 Copies/mL (H)  09/21/2022 59,800 copies/mL   CD4 T Cell Abs (/uL)  Date Value  05/31/2023 1,149  02/14/2023 955  02/23/2022 1,765   No results found for: HIV1GENOSEQ Lab Results  Component Value Date   WBC 12.6 (H) 06/06/2023   HGB 14.0 06/06/2023   HCT 41.5 06/06/2023   MCV 94.9 06/06/2023   PLT 232.0 06/06/2023    Lab Results  Component Value Date   CREATININE 1.01 06/06/2023   BUN 15 06/06/2023   NA 139 06/06/2023   K 3.8 06/06/2023   CL 104 06/06/2023   CO2 27 06/06/2023   Lab Results  Component Value Date   ALT 12 06/12/2023   AST 20 06/12/2023   ALKPHOS 76 06/12/2023   BILITOT 0.4 06/12/2023    Lab Results  Component Value Date   CHOL 151 06/06/2023   TRIG 158.0 (H) 06/06/2023   HDL 42.90 06/06/2023   LDLCALC 77 06/06/2023   Lab Results  Component Value Date   HAV NON-REACTIVE 07/05/2018   Lab Results  Component Value Date   HEPBSAG NON-REACTIVE 02/17/2020   HEPBSAB NON-REACTIVE 02/17/2020   No results found for: HCVAB Lab Results  Component Value Date   CHLAMYDIAWP Negative 02/14/2023   CHLAMYDIAWP Negative 02/14/2023   CHLAMYDIAWP Negative 02/14/2023   N Negative 02/14/2023   N Negative 02/14/2023   N Negative 02/14/2023   No results found for: GCPROBEAPT No results found for: QUANTGOLD  Health Maintenance: Immunization History  Administered Date(s) Administered   DTaP 04/07/1997, 05/26/1997, 07/24/1997, 08/05/1998, 10/15/2001   HIB (PRP-OMP) 04/07/1997, 05/26/1997, 07/24/1997, 04/27/1998   HPV Quadrivalent 09/13/2012, 11/05/2012   Hepatitis B, PED/ADOLESCENT 1996/05/06, 04/07/1997, 10/27/1997   IPV 04/07/1997, 05/26/1997, 08/05/1998, 10/15/2001   Influenza, Seasonal, Injecte, Preservative Fre 02/14/2023   Influenza,inj,Quad PF,6+ Mos 01/27/2014, 01/21/2016   MMR 04/27/1998, 10/15/2001   Meningococcal Conjugate 09/27/2010   Pneumococcal Conjugate-13 07/23/2018   Pneumococcal Polysaccharide-23 08/25/2020    Tdap 11/09/2007   Varicella 04/27/1998, 09/27/2010   Assessment/Plan: # HIV - continue PO Biktarvy  as is  - labs  - fu in 3 months   # STD screening  - no acute concerns - Urine, oral and anal GC + RPR  # ADHD/Anxiety/Depression/Bipolar d/o - on medications - follows with Psychiatry   # Hyperprolactinemia - on Paliperidone  - follows Endocrinology   # Immunization  - will review in subsequent visits  # Health maintenance - Toxoplasma serology, quantiferon and lipid panel ordered   Patient's labs were reviewed as well as his previous records. Patients questions were addressed and answered. Safe sex counseling done.  I spent 31 minutes involved in face-to-face and non-face-to-face activities for this patient on the day of the visit. Professional time spent includes the following activities: Preparing to see the patient (review of tests), Obtaining and reviewing separately obtained history (last PCP notes on 3/6, ED notes on 4/3, 4/7 and Endocrinology note on 5/5), Performing a medically appropriate examination and evaluation , Ordering medications/labs, Documenting clinical information in the EMR, Independently interpreting results (not separately reported), Communicating results to the patient/family member, Counseling and educating the patient/family  member and Care coordination (not separately reported).   Of note, portions of this note may have been created with voice recognition software. While this note has been edited for accuracy, occasional wrong-word or 'sound-a-like' substitutions may have occurred due to the inherent limitations of voice recognition software.   Electronically signed by:  Terre Ferri, MD Infectious Disease Physician [facility] [address] [place], [place] [address] Phone: [phone]  Fax: [phone]

## 2023-10-10 LAB — C. TRACHOMATIS/N. GONORRHOEAE RNA
C. trachomatis RNA, TMA: NOT DETECTED
N. gonorrhoeae RNA, TMA: NOT DETECTED

## 2023-10-10 LAB — COMPREHENSIVE METABOLIC PANEL WITH GFR
AG Ratio: 2.1 (calc) (ref 1.0–2.5)
ALT: 8 U/L — ABNORMAL LOW (ref 9–46)
AST: 14 U/L (ref 10–40)
Albumin: 5 g/dL (ref 3.6–5.1)
Alkaline phosphatase (APISO): 72 U/L (ref 36–130)
BUN: 10 mg/dL (ref 7–25)
CO2: 27 mmol/L (ref 20–32)
Calcium: 9.6 mg/dL (ref 8.6–10.3)
Chloride: 103 mmol/L (ref 98–110)
Creat: 0.83 mg/dL (ref 0.60–1.24)
Globulin: 2.4 g/dL (ref 1.9–3.7)
Glucose, Bld: 102 mg/dL — ABNORMAL HIGH (ref 65–99)
Potassium: 4 mmol/L (ref 3.5–5.3)
Sodium: 138 mmol/L (ref 135–146)
Total Bilirubin: 0.7 mg/dL (ref 0.2–1.2)
Total Protein: 7.4 g/dL (ref 6.1–8.1)
eGFR: 124 mL/min/{1.73_m2} (ref >=60)

## 2023-10-10 LAB — RPR: RPR Ser Ql: REACTIVE — AB

## 2023-10-10 LAB — LIPID PANEL
Cholesterol: 186 mg/dL (ref <200)
HDL: 44 mg/dL (ref >=40)
LDL Cholesterol (Calc): 121 mg/dL — ABNORMAL HIGH
Non-HDL Cholesterol (Calc): 142 mg/dL — ABNORMAL HIGH (ref <130)
Total CHOL/HDL Ratio: 4.2 (calc) (ref <5.0)
Triglycerides: 105 mg/dL (ref <150)

## 2023-10-10 LAB — GC/CHLAMYDIA PROBE, AMP (THROAT)
Chlamydia trachomatis RNA: NOT DETECTED
Neisseria gonorrhoeae RNA: NOT DETECTED

## 2023-10-10 LAB — T-HELPER CELLS (CD4) COUNT (NOT AT ARMC)
Absolute CD4: 1470 {cells}/uL (ref 490–1740)
CD4 T Helper %: 46 % (ref 30–61)
Total lymphocyte count: 3214 {cells}/uL (ref 850–3900)

## 2023-10-10 LAB — CT/NG RNA, TMA RECTAL
Chlamydia Trachomatis RNA: NOT DETECTED
Neisseria Gonorrhoeae RNA: NOT DETECTED

## 2023-10-10 LAB — T PALLIDUM AB: T Pallidum Abs: POSITIVE — AB

## 2023-10-10 LAB — RPR TITER: RPR Titer: 1:4 {titer} — ABNORMAL HIGH

## 2023-10-12 DIAGNOSIS — B20 Human immunodeficiency virus [HIV] disease: Secondary | ICD-10-CM | POA: Insufficient documentation

## 2023-10-12 LAB — TOXOPLASMA GONDII ANTIBODY, IGG: Toxoplasma IgG Ratio: <7.2 [IU]/mL

## 2023-10-13 ENCOUNTER — Ambulatory Visit: Payer: Self-pay | Admitting: Infectious Diseases

## 2023-10-19 ENCOUNTER — Ambulatory Visit (INDEPENDENT_AMBULATORY_CARE_PROVIDER_SITE_OTHER)

## 2023-10-19 VITALS — BP 131/82 | HR 87 | Resp 16 | Ht 70.0 in | Wt 146.2 lb

## 2023-10-19 DIAGNOSIS — F330 Major depressive disorder, recurrent, mild: Secondary | ICD-10-CM | POA: Diagnosis not present

## 2023-10-19 NOTE — Progress Notes (Cosign Needed Addendum)
 Pt presents today for injection of invega  sustenna given in RIGHT- deltoid.Pt tolerated injection with no complaints.      Pt states that he had surgery in his leg which he is currently trying to heal ATM. Pt states that he is doing good on this new does of medicine invega  117.075 mg which was administered in patients right delt. Patient is doing good this week.

## 2023-11-16 ENCOUNTER — Ambulatory Visit (INDEPENDENT_AMBULATORY_CARE_PROVIDER_SITE_OTHER)

## 2023-11-16 VITALS — BP 120/79 | HR 100 | Ht 70.0 in | Wt 145.0 lb

## 2023-11-16 DIAGNOSIS — F332 Major depressive disorder, recurrent severe without psychotic features: Secondary | ICD-10-CM | POA: Diagnosis not present

## 2023-11-16 MED ORDER — PALIPERIDONE PALMITATE ER 117 MG/0.75ML IM SUSY
117.0000 mg | PREFILLED_SYRINGE | Freq: Once | INTRAMUSCULAR | Status: AC
  Administered 2023-11-16: 117 mg via INTRAMUSCULAR

## 2023-11-16 NOTE — Progress Notes (Cosign Needed)
 Pt presents today for injection of invega  sustenna given in LEFT- deltoid.Pt tolerated injection with no complaints.      Pt states that he had surgery in his leg which he is currently trying to heal ATM. Pt states that he is doing good on this new does of medicine invega  117.075 mg which was administered in patients left delt. Patient is doing good this week and moth he states.

## 2023-11-24 ENCOUNTER — Telehealth (HOSPITAL_COMMUNITY): Admitting: Physician Assistant

## 2023-11-24 DIAGNOSIS — F411 Generalized anxiety disorder: Secondary | ICD-10-CM | POA: Diagnosis not present

## 2023-11-24 DIAGNOSIS — F251 Schizoaffective disorder, depressive type: Secondary | ICD-10-CM

## 2023-11-24 MED ORDER — TRAZODONE HCL 100 MG PO TABS: 100.0000 mg | ORAL_TABLET | Freq: Every evening | ORAL | 3 refills | Status: DC | PRN

## 2023-11-24 MED ORDER — MIRTAZAPINE 45 MG PO TBDP: 45.0000 mg | ORAL_TABLET | Freq: Every day | ORAL | 3 refills | Status: DC

## 2023-11-26 ENCOUNTER — Encounter (HOSPITAL_COMMUNITY): Payer: Self-pay | Admitting: Physician Assistant

## 2023-11-26 NOTE — Progress Notes (Signed)
 [facility] MD/PA/NP OP Progress Note  Virtual Visit via Video Note  I connected with Devon Hernandez on 11/24/23 at  2:00 PM EDT by a video enabled telemedicine application and verified that I am speaking with the correct person using two identifiers.  Location: Patient: Home Provider: Clinic   I discussed the limitations of evaluation and management by telemedicine and the availability of in person appointments. The patient expressed understanding and agreed to proceed.  Follow Up Instructions:  I discussed the assessment and treatment plan with the patient. The patient was provided an opportunity to ask questions and all were answered. The patient agreed with the plan and demonstrated an understanding of the instructions.   The patient was advised to call back or seek an in-person evaluation if the symptoms worsen or if the condition fails to improve as anticipated.  I provided 25 minutes of non-face-to-face time during this encounter.  Reginia FORBES Bolster, PA    11/24/2023 2:00 PM ALCIDES NUTTING  MRN:  [id number]  Chief Complaint:  Chief Complaint  Patient presents with   Follow-up   Medication Management   HPI:   Devon Hernandez is a 28 year old male with a past psychiatric history significant for schizoaffective disorder (depressive type) and generalized anxiety disorder who presents to [facility] via virtual video visit for follow-up and medication management.  Patient was last seen by Dr. Harl on on 09/13/2023.  During his last encounter, patient was being managed on the following psychiatric medications:  Trazodone  100 mg at bedtime as needed Mirtazapine  30 mg at bedtime Paliperidone  ER (Invega  Sustenna) 117 mg every 28 days  Patient reports no issues or concerns regarding their current medication regimen.  They report they don't take trazodone  as often due to taking their Remeron  regularly.  Patient reports that they are still continuing to  receive their Invega  Sustenna injection.  They report that their attention is getting harder to maintain and find themself zoning out more.  Patient continues to experience depression and rates their depression a 4 out of 10 with 10 being most severe.  Patient endorses depressive episodes 2 to 3 days/week.  Patient endorses the following depressive symptoms: negative thoughts and feelings of sadness.  Patient attributes their depression to financial instability and being separated from family.  Patient rates their anxiety a 5 out of 10 stating that they are anxious about a lot of things in life.  Patient denies any changes in their behavior but continues to endorse paranoia characterized by random anxiety over thoughts that something will happen to one of their family members.  Patient denies auditory or visual hallucinations.  A PHQ-9 screen was performed with the patient scoring a 17.  A GAD-7 screen was also performed with the patient scoring a 13.  Patient is alert and oriented x 4, calm, cooperative, and fully engaged in conversation during the encounter.  Patient reports that their mood feels off and that they are having a bad day.  Patient exhibits depressed mood with appropriate affect.  Patient denies suicidal or homicidal ideations.  He further denies auditory or visual hallucinations and does not appear to be responding to internal/external stimuli.  Patient endorses fair sleep and receives on average 5 to 6 hours of sleep per night.  Patient endorses decreased appetite and eats on average 2 meals per day.  Patient endorses alcohol consumption on occasion in the form of wine.  Patient endorses tobacco use in the form of cigars stating  that they smoke 3 cigars/day.  Patient endorses illicit drug use in the form of marijuana.  Visit Diagnosis:    ICD-10-CM   1. Schizoaffective disorder, depressive type (HCC)  F25.1 traZODone  (DESYREL ) 100 MG tablet    mirtazapine  (REMERON  SOL-TAB) 45 MG  disintegrating tablet    2. Generalized anxiety disorder  F41.1 mirtazapine  (REMERON  SOL-TAB) 45 MG disintegrating tablet      Past Psychiatric History:  Paranoid schizophrenia/schizoaffective disorder PTSD ODD ADHD Borderline personality disorder Meth abuse Patient has a past history of 4 suicide attempts and 3 psychiatric hospitalization (last age 23 prior to his admission at [facility] on 09/21/2022)  Past Medical History:  Past Medical History:  Diagnosis Date   ADHD (attention deficit hyperactivity disorder)    Allergy    Anxiety    Bipolar disorder (HCC)    Depression    Headache(784.0)    HIV (human immunodeficiency virus infection) (HCC) 07/05/2018   Immune deficiency disorder (HCC)    Rectal abscess     Past Surgical History:  Procedure Laterality Date   TYMPANOSTOMY TUBE PLACEMENT      Family Psychiatric History:  Mother - Depression Father - Depression and PTSD   Family History:  Family History  Problem Relation Age of Onset   Depression Mother    Hypertension Mother    Depression Father        Hx: PTSD   Diabetes Maternal Aunt    AVM Maternal Grandmother    Aneurysm Maternal Grandmother    Seizures Maternal Grandmother    Stomach cancer Neg Hx    Pancreatic cancer Neg Hx    Colon cancer Neg Hx    Liver disease Neg Hx    Esophageal cancer Neg Hx    Rectal cancer Neg Hx     Social History:  Social History   Socioeconomic History   Marital status: Single    Spouse name: Not on file   Number of children: Not on file   Years of education: Not on file   Highest education level: Not on file  Occupational History   Not on file  Tobacco Use   Smoking status: Every Day    Types: Cigars   Smokeless tobacco: Never   Tobacco comments:    Smokes 2 cigars a day but trying to quit cutting back.   Vaping Use   Vaping status: Former  Substance and Sexual Activity   Alcohol use: Not Currently   Drug use: Yes    Frequency: 7.0 times  per week    Types: Marijuana   Sexual activity: Not Currently    Comment: DECLINED CONDOMS  Other Topics Concern   Not on file  Social History Narrative   Mom currently has full custody but Shneur has gone to live with his dad. Tonight was the first night that he was at dad's house. He admitted to marijuana use tonight, unclear when. Smoke exposure at home. No pets at home.   Social Drivers of Corporate investment banker Strain: Not on file  Food Insecurity: Not on file  Transportation Needs: Not on file  Physical Activity: Not on file  Stress: Not on file  Social Connections: Not on file    Allergies: No Known Allergies  Metabolic Disorder Labs: Lab Results  Component Value Date   HGBA1C 5.5 06/06/2023   MPG 111.15 09/21/2022   MPG 117 (H) 01/19/2013   Lab Results  Component Value Date   PROLACTIN 55.1 (H) 09/04/2023  PROLACTIN 97.5 (H) 06/12/2023   Lab Results  Component Value Date   CHOL 186 10/09/2023   TRIG 105 10/09/2023   HDL 44 10/09/2023   CHOLHDL 4.2 10/09/2023   VLDL 68.3 06/06/2023   LDLCALC 121 (H) 10/09/2023   LDLCALC 77 06/06/2023   Lab Results  Component Value Date   TSH 1.030 06/12/2023   TSH 0.487 09/21/2022    Therapeutic Level Labs: No results found for: LITHIUM  No results found for: VALPROATE No results found for: CBMZ  Current Medications: Current Outpatient Medications  Medication Sig Dispense Refill   acetaminophen  (TYLENOL ) 325 MG tablet Take 650 mg by mouth every 6 (six) hours as needed.     bictegravir-emtricitabine -tenofovir  AF (BIKTARVY ) 50-200-25 MG TABS tablet Take 1 tablet by mouth daily. 90 tablet 3   hydrOXYzine  (ATARAX ) 25 MG tablet Take 1 tablet (25 mg total) by mouth 3 (three) times daily. 90 tablet 3   ibuprofen  (ADVIL ) 200 MG tablet Take 600 mg by mouth every 6 (six) hours as needed.     mirtazapine  (REMERON  SOL-TAB) 45 MG disintegrating tablet Take 1 tablet (45 mg total) by mouth at bedtime. 30 tablet 3    Paliperidone  ER (INVEGA  SUSTENNA) injection Inject 117 mg into the muscle every 28 (twenty-eight) days. 0.9 mL 11   senna-docusate (SENOKOT-S) 8.6-50 MG tablet Take 1 tablet by mouth as directed.     traZODone  (DESYREL ) 100 MG tablet Take 1 tablet (100 mg total) by mouth at bedtime as needed and may repeat dose one time if needed for sleep. 30 tablet 3   Current Facility-Administered Medications  Medication Dose Route Frequency Provider Last Rate Last Admin   mupirocin  ointment (BACTROBAN ) 2 %   Topical BID Purcell Emil Schanz, MD         Musculoskeletal: Strength & Muscle Tone: within normal limits Gait & Station: normal Patient leans: N/A  Psychiatric Specialty Exam: Review of Systems  Psychiatric/Behavioral:  Positive for dysphoric mood and sleep disturbance. Negative for decreased concentration, hallucinations, self-injury and suicidal ideas. The patient is nervous/anxious. The patient is not hyperactive.     There were no vitals taken for this visit.There is no height or weight on file to calculate BMI.  General Appearance: Casual  Eye Contact:  Good  Speech:  Clear and Coherent and Normal Rate  Volume:  Normal  Mood:  Anxious and Depressed  Affect:  Congruent  Thought Process:  Coherent, Goal Directed, and Descriptions of Associations: Intact  Orientation:  Full (Time, Place, and Person)  Thought Content: WDL   Suicidal Thoughts:  No  Homicidal Thoughts:  No  Memory:  Immediate;   Good Recent;   Good Remote;   Good  Judgement:  Good  Insight:  Good  Psychomotor Activity:  Normal  Concentration:  Concentration: Good and Attention Span: Good  Recall:  Good  Fund of Knowledge: Good  Language: Good  Akathisia:  No  Handed:  Right  AIMS (if indicated): not done  Assets:  Communication Skills Desire for Improvement Financial Resources/Insurance Housing Intimacy Leisure Time Physical Health Social Support Vocational/Educational  ADL's:  Intact  Cognition: WNL   Sleep:  Fair   Screenings: AIMS    Flowsheet Row Video Visit from 11/24/2023 in [facility]  AIMS Total Score 1   AUDIT    Flowsheet Row [facility] Ad[facility] ([facility]  Alcohol Use Disorder Identification Test  Final Score (AUDIT) 0 0   GAD-7    Flowsheet Row Video Visit from 11/24/2023 in [facility] Office Visit from 10/09/2023 in [facility] [place] Video Visit from 09/13/2023 in [facility] Video Visit from 06/07/2023 in [facility] Video Visit from 03/29/2023 in [facility]  Total GAD-7 Score 13 2 5 15 6    PHQ2-9    Flowsheet Row Video Visit from 11/24/2023 in [facility] Office Visit from 10/09/2023 in [facility] [place] Video Visit from 09/13/2023 in [facility] Office Visit from 07/06/2023 in [facility] Video Visit from 06/07/2023 in [facility]  PHQ-2 Total Score 3 1 1 4 4   PHQ-9 Total Score 17 3 2 14 15    Flowsheet Row Video Visit from 11/24/2023 in [facility] Video Visit from 09/13/2023 in [facility] from 08/07/2023 in [facility])  C-SSRS RISK CATEGORY Moderate Risk Error: Q3, 4, or 5 should not be populated when Q2 is No Moderate Risk     Assessment and Plan:   Aundra Espin is a 28 year old male with a past psychiatric history significant for schizoaffective disorder (depressive type) and generalized anxiety disorder who presents to [facility] via virtual video visit for follow-up and medication management.  Patient presents today encounter stating that he has been taking his Remeron  regularly and states that it has been helpful in managing his sleep.  He reports that he has had to rely less on trazodone  since he takes mirtazapine  regularly.  Despite taking mirtazapine  regularly, patient reports that they receives on average 5 to 6 hours of sleep per night.  Provider recommended increasing patient's mirtazapine  dosage from 30 mg to 45 mg at bedtime for the management of his sleep.  Patient reports that they continue to receive their Invega  Sustenna injection every 28 days.  Since receiving the medication, patient reports that it has been harder for them to maintain focus and also to find themselves going out more.  Patient also continues to endorse some depression and anxiety attributed to life stressors.  A PHQ-9 screen was performed with the patient scoring a 17.  A GAD-7 screen was also performed with the patient scoring at 13.  Patient's mirtazapine  to be adjusted from 30 mg to 45 mg at bedtime for the management of his depressive symptoms and anxiety.  Patient informed provider that he occasionally experiences paranoia characterized by anxiety overt the belief that something will happen to his family members.  He reports that his paranoia does not occur often but may occur periodically.  He denies changes in his behavior and does not appear to be responding to internal/external stimuli.  Patient is currently on Invega  Sustenna 117 mg every 28 days.  If patient's paranoia continues to worsen, then consider adjusting patient's Invega  Sustenna to a higher dose.  A [place] Suicide Severity Rating Scale was performed with the patient being considered moderate risk.  Patie moderate risk nt denies suicidal ideations and is able to contract for safety at this time.  Safety planning was discussed with the patient prior to the  conclusion of the encounter.  - Patient was instructed  to contact 911 in the event of a mental health crisis. - Patient was instructed to contact [facility] [employer] Crisis [employer] in the event of a mental health crisis. - Patient was instructed to present to [facility] in the event of a mental health crisis.  Collaboration of Care: Collaboration of Care: Medication Management AEB provider managing patient's psychiatric medications, Psychiatrist AEB patient being followed by a mental health provider at this facility, and Other provider involved in patient's care AEB patient being followed by endocrinology and infectious disease  Patient/Guardian was advised Release of Information must be obtained prior to any record release in order to collaborate their care with an outside provider. Patient/Guardian was advised if they have not already done so to contact the registration department to sign all necessary forms in order for us  to release information regarding their care.   Consent: Patient/Guardian gives verbal consent for treatment and assignment of benefits for services provided during this visit. Patient/Guardian expressed understanding and agreed to proceed.   1. Schizoaffective disorder, depressive type (HCC) Patient to continue taking paliperidone  ER (Invega  Sustenna) 117 mg injection every 28 days for the management of his schizoaffective disorder  - traZODone  (DESYREL ) 100 MG tablet; Take 1 tablet (100 mg total) by mouth at bedtime as needed and may repeat dose one time if needed for sleep.  Dispense: 30 tablet; Refill: 3 - mirtazapine  (REMERON  SOL-TAB) 45 MG disintegrating tablet; Take 1 tablet (45 mg total) by mouth at bedtime.  Dispense: 30 tablet; Refill: 3  2. Generalized anxiety disorder  - mirtazapine  (REMERON  SOL-TAB) 45 MG disintegrating tablet; Take 1 tablet (45  mg total) by mouth at bedtime.  Dispense: 30 tablet; Refill: 3  Patient to follow up  in 2 months Provider spent in total of 25 minutes with the patient/reviewing patient's chart  Reginia FORBES Bolster, PA 11/24/2023, 2:00 PM

## 2023-12-01 ENCOUNTER — Other Ambulatory Visit

## 2023-12-01 DIAGNOSIS — E221 Hyperprolactinemia: Secondary | ICD-10-CM | POA: Diagnosis not present

## 2023-12-01 LAB — PROLACTIN: Prolactin: 36.3 ng/mL — ABNORMAL HIGH (ref 2.0–18.0)

## 2023-12-06 ENCOUNTER — Ambulatory Visit (INDEPENDENT_AMBULATORY_CARE_PROVIDER_SITE_OTHER): Admitting: "Endocrinology

## 2023-12-06 ENCOUNTER — Encounter: Payer: Self-pay | Admitting: "Endocrinology

## 2023-12-06 VITALS — BP 110/80 | HR 121 | Ht 70.0 in | Wt 147.0 lb

## 2023-12-06 DIAGNOSIS — E221 Hyperprolactinemia: Secondary | ICD-10-CM | POA: Diagnosis not present

## 2023-12-06 NOTE — Progress Notes (Signed)
 Outpatient Endocrinology Note Devon Birmingham, MD    Devon Hernandez 12/27/1995 [id number]  Referring Provider: Purcell Emil Schanz, * Primary Care Provider: Purcell Emil Schanz, MD Reason for consultation: Subjective   Assessment & Plan  Diagnoses and all orders for this visit:  Hyperprolactinemia [facility]) -     Prolactin  Patient has been on paliperidone  shots, and his prolactin was checked by the provider which came back high hands triggering the referral Patient's paliperidone  shot dose has been decreased from 156 mg to now 117 mg, has only been 2-3 weeks since the change in 08/2023 Asymptomatic 06/12/2023 TSH was normal with normal total T4, T3 uptake ratio and free thyroxine index, however prolactin was elevated at 97.5 for the first time No MRI done Prolactin consistently improving, will continue to monitor   Return in about 3 months (around 03/07/2024) for visit and 8 am labs before next visit.   I have reviewed current medications, nurse's notes, allergies, vital signs, past medical and surgical history, family medical history, and social history for this encounter. Counseled patient on symptoms, examination findings, lab findings, imaging results, treatment decisions and monitoring and prognosis. The patient understood the recommendations and agrees with the treatment plan. All questions regarding treatment plan were fully answered.  Devon Birmingham, MD  12/06/23   History of Present Illness HPI   Devon Hernandez is a 28 y.o. male referred by Dr. Sagardia for evaluation and management of hyperprolactinemia diagnosed in 06/2023.   No head aches/vision changes Continues to be on paliperidone  117 mg into the muscle every 28 (twenty-eight) days  No complaints Feels well overall No breast discharge/breast enlargement  Initial history:  He reports the following;  Headaches Yes, from time to time  visual blurring/ diplopia/ fields defect No Galactorrhea  No gynecomastia No  change in facial appearance  No body habitus No change in his hand, ring, hat, shoe size  Yes, shoe size went up by 1 point  hyperhidrosis No arthralgias No  fatigue Yes weight change Yes change in appetite No heat/cold intolerance Yes, hot more often change in bowel movements Yes, constipated sometimes change in muscle strength Yes changes in skin or hair No Palpitations No insomnia No tremor No  moon faces No fat pads No increased girth  No plethora hyperpigmentation No purple striae No acne No proximal muscle weakness No nausea/vomiting No lightheadedness No abdominal pain No   change in body hair No change in shaving No hot flashes No night sweats No  Family history is negative for pituitary tumor or other abnormalities concerning for MEN syndrome.   Physical Exam  BP 110/80   Pulse (!) 121   Ht 5' 10 (1.778 m)   Wt 147 lb (66.7 kg)   SpO2 98%   BMI 21.09 kg/m    Constitutional: well developed, well nourished Head: normocephalic, atraumatic Eyes: sclera anicteric, no redness Neck: supple Chest: No galactorrhea/gynecomastia noted Lungs: normal respiratory effort Neurology: alert and oriented Skin: dry, no appreciable rashes Musculoskeletal: no appreciable defects Psychiatric: normal mood and affect   Current Medications Patient's Medications  New Prescriptions   No medications on file  Previous Medications   ACETAMINOPHEN  (TYLENOL ) 325 MG TABLET    Take 650 mg by mouth every 6 (six) hours as needed.   BICTEGRAVIR-EMTRICITABINE -TENOFOVIR  AF (BIKTARVY ) 50-200-25 MG TABS TABLET    Take 1 tablet by mouth daily.   HYDROXYZINE  (ATARAX ) 25 MG TABLET    Take 1 tablet (25 mg total) by mouth  3 (three) times daily.   IBUPROFEN  (ADVIL ) 200 MG TABLET    Take 600 mg by mouth every 6 (six) hours as needed.   MIRTAZAPINE  (REMERON  SOL-TAB) 45 MG DISINTEGRATING TABLET    Take 1 tablet (45 mg total) by mouth at bedtime.   PALIPERIDONE  ER  (INVEGA  SUSTENNA) INJECTION    Inject 117 mg into the muscle every 28 (twenty-eight) days.   SENNA-DOCUSATE (SENOKOT-S) 8.6-50 MG TABLET    Take 1 tablet by mouth as directed.   TRAZODONE  (DESYREL ) 100 MG TABLET    Take 1 tablet (100 mg total) by mouth at bedtime as needed and may repeat dose one time if needed for sleep.  Modified Medications   No medications on file  Discontinued Medications   No medications on file    Allergies No Known Allergies  Past Medical History Past Medical History:  Diagnosis Date   ADHD (attention deficit hyperactivity disorder)    Allergy    Anxiety    Bipolar disorder (HCC)    Depression    Headache(784.0)    HIV (human immunodeficiency virus infection) (HCC) 07/05/2018   Immune deficiency disorder (HCC)    Rectal abscess     Past Surgical History Past Surgical History:  Procedure Laterality Date   TYMPANOSTOMY TUBE PLACEMENT      Family History family history includes AVM in his maternal grandmother; Aneurysm in his maternal grandmother; Depression in his father and mother; Diabetes in his maternal aunt; Hypertension in his mother; Seizures in his maternal grandmother.  Social History Social History   Socioeconomic History   Marital status: Single    Spouse name: Not on file   Number of children: Not on file   Years of education: Not on file   Highest education level: Not on file  Occupational History   Not on file  Tobacco Use   Smoking status: Every Day    Types: Cigars   Smokeless tobacco: Never   Tobacco comments:    Smokes 2 cigars a day but trying to quit cutting back.   Vaping Use   Vaping status: Former  Substance and Sexual Activity   Alcohol use: Not Currently   Drug use: Yes    Frequency: 7.0 times per week    Types: Marijuana   Sexual activity: Not Currently    Comment: DECLINED CONDOMS  Other Topics Concern   Not on file  Social History Narrative   Mom currently has full custody but Devon Hernandez has gone to live with  his dad. Tonight was the first night that he was at dad's house. He admitted to marijuana use tonight, unclear when. Smoke exposure at home. No pets at home.   Social Drivers of Corporate investment banker Strain: Not on file  Food Insecurity: Not on file  Transportation Needs: Not on file  Physical Activity: Not on file  Stress: Not on file  Social Connections: Not on file  Intimate Partner Violence: Not on file    Lab Results  Component Value Date   CHOL 186 10/09/2023   Lab Results  Component Value Date   HDL 44 10/09/2023   Lab Results  Component Value Date   LDLCALC 121 (H) 10/09/2023   Lab Results  Component Value Date   TRIG 105 10/09/2023   Lab Results  Component Value Date   CHOLHDL 4.2 10/09/2023   Lab Results  Component Value Date   CREATININE 0.83 10/09/2023   Lab Results  Component Value Date   GFR  102.75 06/06/2023      Component Value Date/Time   NA 138 10/09/2023 1136   K 4.0 10/09/2023 1136   CL 103 10/09/2023 1136   CO2 27 10/09/2023 1136   GLUCOSE 102 (H) 10/09/2023 1136   BUN 10 10/09/2023 1136   CREATININE 0.83 10/09/2023 1136   CALCIUM  9.6 10/09/2023 1136   PROT 7.4 10/09/2023 1136   PROT 7.6 06/12/2023 1125   ALBUMIN 4.9 06/12/2023 1125   AST 14 10/09/2023 1136   ALT 8 (L) 10/09/2023 1136   ALKPHOS 76 06/12/2023 1125   BILITOT 0.7 10/09/2023 1136   BILITOT 0.4 06/12/2023 1125   GFRNONAA >60 02/05/2023 2202   GFRNONAA 94 02/12/2020 1030   GFRAA 109 02/12/2020 1030      Latest Ref Rng & Units 10/09/2023   11:36 AM 06/06/2023    2:25 PM 02/05/2023   10:02 PM  BMP  Glucose 65 - 99 mg/dL 897  72  91   BUN 7 - 25 mg/dL 10  15  11    Creatinine 0.60 - 1.24 mg/dL 9.16  8.98  9.19   BUN/Creat Ratio 6 - 22 (calc) SEE NOTE:     Sodium 135 - 146 mmol/L 138  139  136   Potassium 3.5 - 5.3 mmol/L 4.0  3.8  3.5   Chloride 98 - 110 mmol/L 103  104  99   CO2 20 - 32 mmol/L 27  27  28    Calcium  8.6 - 10.3 mg/dL 9.6  9.2  9.3         Component Value Date/Time   WBC 10.6 10/09/2023 1136   RBC 4.64 10/09/2023 1136   HGB 14.5 10/09/2023 1136   HGB 13.5 09/21/2022 1726   HCT 42.4 10/09/2023 1136   HCT 39.4 09/21/2022 1726   PLT 251 10/09/2023 1136   PLT 252 09/21/2022 1726   MCV 91.4 10/09/2023 1136   MCV 88 09/21/2022 1726   MCH 31.3 10/09/2023 1136   MCHC 34.2 10/09/2023 1136   RDW 13.6 10/09/2023 1136   RDW 12.2 09/21/2022 1726   LYMPHSABS 3.8 06/06/2023 1425   LYMPHSABS 2.1 09/21/2022 1726   MONOABS 1.3 (H) 06/06/2023 1425   EOSABS 0.2 06/06/2023 1425   EOSABS 0.1 09/21/2022 1726   BASOSABS 0.1 06/06/2023 1425   BASOSABS 0.0 09/21/2022 1726   Lab Results  Component Value Date   TSH 1.030 06/12/2023   TSH 0.487 09/21/2022   TSH 1.311 01/19/2013         Parts of this note may have been dictated using voice recognition software. There may be variances in spelling and vocabulary which are unintentional. Not all errors are proofread. Please notify the dino if any discrepancies are noted or if the meaning of any statement is not clear.

## 2023-12-14 ENCOUNTER — Ambulatory Visit (INDEPENDENT_AMBULATORY_CARE_PROVIDER_SITE_OTHER)

## 2023-12-14 ENCOUNTER — Encounter: Payer: Self-pay | Admitting: Emergency Medicine

## 2023-12-14 ENCOUNTER — Ambulatory Visit (INDEPENDENT_AMBULATORY_CARE_PROVIDER_SITE_OTHER): Admitting: Emergency Medicine

## 2023-12-14 VITALS — BP 128/82 | Ht 70.0 in | Wt 144.0 lb

## 2023-12-14 VITALS — BP 118/72 | HR 113 | Temp 99.0°F | Ht 70.0 in | Wt 147.0 lb

## 2023-12-14 DIAGNOSIS — R0789 Other chest pain: Secondary | ICD-10-CM

## 2023-12-14 DIAGNOSIS — L84 Corns and callosities: Secondary | ICD-10-CM | POA: Diagnosis not present

## 2023-12-14 DIAGNOSIS — F251 Schizoaffective disorder, depressive type: Secondary | ICD-10-CM | POA: Diagnosis not present

## 2023-12-14 MED ORDER — PALIPERIDONE PALMITATE ER 117 MG/0.75ML IM SUSY
117.0000 mg | PREFILLED_SYRINGE | Freq: Once | INTRAMUSCULAR | Status: AC
  Administered 2023-12-14: 117 mg via INTRAMUSCULAR

## 2023-12-14 NOTE — Assessment & Plan Note (Signed)
 Bilateral large tender calluses Needs evaluation by podiatry Referral placed today for possible surgical treatment

## 2023-12-14 NOTE — Patient Instructions (Signed)
Chest Wall Pain Chest wall pain is pain in or around the bones and muscles of your chest. Chest wall pain may be caused by: An injury. Coughing a lot. Using your chest and arm muscles too much. Sometimes, the cause may not be known. This pain may take a few [place] or longer to get better. Follow these instructions at home: Managing pain, stiffness, and swelling If told, put ice on the painful area: Put ice in a plastic bag. Place a towel between your skin and the bag. Leave the ice on for 20 minutes, 2-3 times a day.  Activity Rest as told by your doctor. Avoid doing things that cause pain. This includes lifting heavy items. Ask your doctor what activities are safe for you. General instructions  Take over-the-counter and prescription medicines only as told by your doctor. Do not use any products that contain nicotine or tobacco, such as cigarettes, e-cigarettes, and chewing tobacco. If you need help quitting, ask your doctor. Keep all follow-up visits as told by your doctor. This is important. Contact a doctor if: You have a fever. Your chest pain gets worse. You have new symptoms. Get help right away if: You feel sick to your stomach (nauseous) or you throw up (vomit). You feel sweaty or light-headed. You have a cough with mucus from your lungs (sputum) or you cough up blood. You are short of breath. These symptoms may be an emergency. Do not wait to see if the symptoms will go away. Get medical help right away. Call your local emergency services (911 in the [place] Do not drive yourself to the hospital. Summary Chest wall pain is pain in or around the bones and muscles of your chest. It may be treated with ice, rest, and medicines. Your condition may also get better if you avoid doing things that cause pain. Contact a doctor if you have a fever, chest pain that gets worse, or new symptoms. Get help right away if you feel light-headed or you get short of breath. These symptoms may  be an emergency. This information is not intended to replace advice given to you by your health care provider. Make sure you discuss any questions you have with your health care provider. Document Revised: 04/11/2022 Document Reviewed: 04/11/2022 Elsevier Patient Education  2024 [employer].

## 2023-12-14 NOTE — Progress Notes (Cosign Needed)
 Pt presents today for injection of invega  sustenna given in RIGHT- deltoid.Pt tolerated injection with no complaints.      Pt states that he had surgery in his leg which he is currently trying to heal ATM. Pt states that he is doing good on this new does of medicine invega  117.075. Patient is doing good this week and month he states. Pt states that his mirtazapine  is going good for him too.

## 2023-12-14 NOTE — Assessment & Plan Note (Signed)
 Clinically stable.  No red flag signs or symptoms. Unremarkable physical examination Normal chest x-ray.  Images independently reviewed by me. Pain management discussed May use Tylenol and or Advil for pain as needed ED precautions given Advised to contact the office if no better or worse during the next several days or weeks.

## 2023-12-14 NOTE — Progress Notes (Signed)
 Baxter JINNY [place] 28 y.o.   Chief Complaint  Patient presents with   soreness    Patient here for soreness on left side of chest, not pain. pt said it only hurts when he uses the restroom when having to strain or sitting. He said it has been going for about a week. No numbness. he also wants to get a reccomendation for calucus on bottom of feet.    HISTORY OF PRESENT ILLNESS: This is a 28 y.o. male complaining of soreness to left side of the chest that started a couple days ago close to a week.  No associated symptoms Also has history of calluses both feet.  Wants referral to foot doctor. No other complaints or medical concerns today.  HPI   Prior to Admission medications   Medication Sig Start Date End Date Taking? Authorizing Provider  acetaminophen  (TYLENOL ) 325 MG tablet Take 650 mg by mouth every 6 (six) hours as needed.   Yes [provider]  bictegravir-emtricitabine -tenofovir  AF (BIKTARVY ) 50-200-25 MG TABS tablet Take 1 tablet by mouth daily. 10/09/23  Yes Manandhar, Sabina, MD  hydrOXYzine  (ATARAX ) 25 MG tablet Take 1 tablet (25 mg total) by mouth 3 (three) times daily. 09/13/23  Yes Harl Regan E, NP  ibuprofen  (ADVIL ) 200 MG tablet Take 600 mg by mouth every 6 (six) hours as needed.   Yes [provider]  mirtazapine  (REMERON  SOL-TAB) 45 MG disintegrating tablet Take 1 tablet (45 mg total) by mouth at bedtime. 11/24/23  Yes Nwoko, Uchenna E, PA  Paliperidone  ER (INVEGA  SUSTENNA) injection Inject 117 mg into the muscle every 28 (twenty-eight) days. 09/13/23  Yes Harl Regan E, NP  senna-docusate (SENOKOT-S) 8.6-50 MG tablet Take 1 tablet by mouth as directed. 10/13/22  Yes [provider]  traZODone  (DESYREL ) 100 MG tablet Take 1 tablet (100 mg total) by mouth at bedtime as needed and may repeat dose one time if needed for sleep. 11/24/23  Yes Nwoko, Reginia E, PA    No Known Allergies  Patient Active Problem List   Diagnosis Date Noted   Human  immunodeficiency virus (HIV) disease (HCC) 10/12/2023   HIV disease (HCC) 10/09/2023   Medication management 10/09/2023   Health care maintenance 10/09/2023   Screening for STDs (sexually transmitted diseases) 10/09/2023   Tobacco abuse 07/17/2023   Anal fistula 07/17/2023   Right flank pain 07/06/2023   Musculoskeletal pain 07/06/2023   Callus of foot 06/06/2023   Bipolar affective disorder, current episode depressed (HCC) 09/21/2022   Borderline personality disorder (HCC) 09/21/2022   Methamphetamine abuse (HCC) 09/21/2022   Cannabis use disorder, severe, in controlled environment, dependence (HCC) 09/21/2022   Schizoaffective disorder (HCC) 09/21/2022   Schizoaffective disorder, depressive type (HCC) 09/21/2022   Depression 03/31/2021   Encounter for long-term (current) use of high-risk medication 04/22/2019   Major depressive disorder, recurrent episode, mild (HCC) 10/13/2018   Human immunodeficiency virus (HIV) disease (HCC) 07/23/2018   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   Perianal mass 03/30/2017   Polysubstance abuse (HCC) 07/11/2013   MDD (major depressive disorder), recurrent episode, severe (HCC) 01/18/2013   ADHD (attention deficit hyperactivity disorder), combined type 01/18/2013   Conduct disorder, 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    Past Medical History:  Diagnosis Date   ADHD (attention deficit hyperactivity disorder)    Allergy    Anxiety    Bipolar disorder (HCC)    Depression    Headache(784.0)    HIV (human immunodeficiency virus infection) (HCC) 07/05/2018  Immune deficiency disorder (HCC)    Rectal abscess     Past Surgical History:  Procedure Laterality Date   TYMPANOSTOMY TUBE PLACEMENT      Social History   Socioeconomic History   Marital status: Single    Spouse name: Not on file   Number of children: Not on file   Years of education: Not on file   Highest education level: Not on file  Occupational History   Not  on file  Tobacco Use   Smoking status: Every Day    Types: Cigars   Smokeless tobacco: Never   Tobacco comments:    Smokes 2 cigars a day but trying to quit cutting back.   Vaping Use   Vaping status: Former  Substance and Sexual Activity   Alcohol use: Not Currently   Drug use: Yes    Frequency: 7.0 times per week    Types: Marijuana   Sexual activity: Not Currently    Comment: DECLINED CONDOMS  Other Topics Concern   Not on file  Social History Narrative   Mom currently has full custody but Mikaeel has gone to live with his dad. Tonight was the first night that he was at dad's house. He admitted to marijuana use tonight, unclear when. Smoke exposure at home. No pets at home.   Social Drivers of Corporate investment banker Strain: Not on file  Food Insecurity: Not on file  Transportation Needs: Not on file  Physical Activity: Not on file  Stress: Not on file  Social Connections: Not on file  Intimate Partner Violence: Not on file    Family History  Problem Relation Age of Onset   Depression Mother    Hypertension Mother    Depression Father        Hx: PTSD   Diabetes Maternal Aunt    AVM Maternal Grandmother    Aneurysm Maternal Grandmother    Seizures Maternal Grandmother    Stomach cancer Neg Hx    Pancreatic cancer Neg Hx    Colon cancer Neg Hx    Liver disease Neg Hx    Esophageal cancer Neg Hx    Rectal cancer Neg Hx      Review of Systems  Constitutional: Negative.  Negative for chills and fever.  HENT: Negative.  Negative for congestion and sore throat.   Respiratory: Negative.  Negative for cough and shortness of breath.   Cardiovascular: Negative.  Negative for chest pain and palpitations.  Gastrointestinal:  Negative for abdominal pain, diarrhea, nausea and vomiting.  Genitourinary: Negative.  Negative for dysuria and hematuria.  Skin: Negative.  Negative for rash.  Neurological: Negative.  Negative for dizziness and headaches.  All other systems  reviewed and are negative.   Vitals:   12/14/23 1004  BP: 118/72  Pulse: (!) 113  Temp: 99 F (37.2 C)  SpO2: 99%    Physical Exam Vitals reviewed.  Constitutional:      Appearance: Normal appearance.  HENT:     Head: Normocephalic.     Mouth/Throat:     Mouth: Mucous membranes are moist.     Pharynx: Oropharynx is clear.  Eyes:     Extraocular Movements: Extraocular movements intact.     Pupils: Pupils are equal, round, and reactive to light.  Cardiovascular:     Rate and Rhythm: Normal rate and regular rhythm.     Pulses: Normal pulses.     Heart sounds: Normal heart sounds.  Pulmonary:     Effort:  Pulmonary effort is normal.     Breath sounds: Normal breath sounds.  Chest:     Chest wall: No tenderness.  Musculoskeletal:     Cervical back: No tenderness.  Lymphadenopathy:     Cervical: No cervical adenopathy.  Skin:    General: Skin is warm and dry.  Neurological:     General: No focal deficit present.     Mental Status: He is alert and oriented to person, place, and time.  Psychiatric:        Mood and Affect: Mood normal.        Behavior: Behavior normal.   DG Chest 2 View Result Date: 12/14/2023 CLINICAL DATA:  Chest soreness and left-sided discomfort. EXAM: CHEST - 2 VIEW COMPARISON:  08/07/2023. FINDINGS: The heart size and mediastinal contours are within normal limits. No focal consolidation, pleural effusion, or pneumothorax. No acute osseous abnormality. IMPRESSION: Normal chest radiographs. Electronically Signed   By: Harrietta Sherry M.D.   On: 12/14/2023 10:41      ASSESSMENT & PLAN: I personally spent a total of 33 minutes minutes in the care of the patient today including preparing to see the patient, getting/reviewing separately obtained history, performing a medically appropriate exam/evaluation, counseling and educating, placing orders, documenting clinical information in the EHR, independently interpreting results, communicating results, and  coordinating care.  Problem List Items Addressed This Visit       Musculoskeletal and Integument   Foot callus   Bilateral large tender calluses Needs evaluation by podiatry Referral placed today for possible surgical treatment        Relevant Orders   Ambulatory referral to Podiatry     Other   Chest discomfort - Primary   Clinically stable.  No red flag signs or symptoms. Unremarkable physical examination Normal chest x-ray.  Images independently reviewed by me. Pain management discussed May use Tylenol  and or Advil  for pain as needed ED precautions given Advised to contact the office if no better or worse during the next several days or weeks.      Relevant Orders   DG Chest 2 View (Completed)   Patient Instructions  Chest Wall Pain Chest wall pain is pain in or around the bones and muscles of your chest. Chest wall pain may be caused by: An injury. Coughing a lot. Using your chest and arm muscles too much. Sometimes, the cause may not be known. This pain may take a few weeks or longer to get better. Follow these instructions at home: Managing pain, stiffness, and swelling If told, put ice on the painful area: Put ice in a plastic bag. Place a towel between your skin and the bag. Leave the ice on for 20 minutes, 2-3 times a day.  Activity Rest as told by your doctor. Avoid doing things that cause pain. This includes lifting heavy items. Ask your doctor what activities are safe for you. General instructions  Take over-the-counter and prescription medicines only as told by your doctor. Do not use any products that contain nicotine  or tobacco, such as cigarettes, e-cigarettes, and chewing tobacco. If you need help quitting, ask your doctor. Keep all follow-up visits as told by your doctor. This is important. Contact a doctor if: You have a fever. Your chest pain gets worse. You have new symptoms. Get help right away if: You feel sick to your stomach  (nauseous) or you throw up (vomit). You feel sweaty or light-headed. You have a cough with mucus from your lungs (sputum) or you cough  up blood. You are short of breath. These symptoms may be an emergency. Do not wait to see if the symptoms will go away. Get medical help right away. Call your local emergency services (911 in the [place] Do not drive yourself to the hospital. Summary Chest wall pain is pain in or around the bones and muscles of your chest. It may be treated with ice, rest, and medicines. Your condition may also get better if you avoid doing things that cause pain. Contact a doctor if you have a fever, chest pain that gets worse, or new symptoms. Get help right away if you feel light-headed or you get short of breath. These symptoms may be an emergency. This information is not intended to replace advice given to you by your health care provider. Make sure you discuss any questions you have with your health care provider. Document Revised: 04/11/2022 Document Reviewed: 04/11/2022 Elsevier Patient Education  2024 [employer]    Emil Schaumann, MD [facility]

## 2023-12-18 ENCOUNTER — Ambulatory Visit: Admitting: Podiatry

## 2023-12-19 ENCOUNTER — Encounter (INDEPENDENT_AMBULATORY_CARE_PROVIDER_SITE_OTHER): Admitting: Podiatry

## 2023-12-19 DIAGNOSIS — Z91199 Patient's noncompliance with other medical treatment and regimen due to unspecified reason: Secondary | ICD-10-CM

## 2023-12-19 NOTE — Progress Notes (Signed)
 Patient did not show for his scheduled appointment this morning

## 2024-01-11 ENCOUNTER — Ambulatory Visit (HOSPITAL_COMMUNITY)

## 2024-01-15 ENCOUNTER — Ambulatory Visit (HOSPITAL_COMMUNITY)

## 2024-01-15 NOTE — Progress Notes (Cosign Needed)
 Patient presented to office for injection. Prior to injection, patient reported feeling uncertain about receiving the injection. Patient reported feeling like he's been living in limbo or constant brain fog. Patient states he no longer wants to be on the injections because of the state it places him in. Patient reported  while on the medi ation he is unable to make small decisions and no longer feels like himself or outgoing. Patient has an appointment with provider on 01/24/24. Patient was encouraged to make it to appointment, therefore; other alternatives can be made. Patient was encouraged to call if he changed his mind or any concerns arise.

## 2024-01-22 ENCOUNTER — Telehealth: Payer: Self-pay

## 2024-01-22 ENCOUNTER — Ambulatory Visit: Admitting: Infectious Diseases

## 2024-01-22 NOTE — Telephone Encounter (Signed)
 Copied from CRM #[id number]. Topic: Clinical - Medication Question >> Jan 22, 2024  1:53 PM Rea ORN wrote: Reason for CRM: Pt will be traveling soon and is requesting an prescription for Scopolamine  Transdermal Patch. Pt would like these to be sent to  [facility]:  [address] [place] [id number]

## 2024-01-24 ENCOUNTER — Telehealth (HOSPITAL_COMMUNITY): Admitting: Psychiatry

## 2024-01-24 ENCOUNTER — Encounter (HOSPITAL_COMMUNITY): Payer: Self-pay | Admitting: Psychiatry

## 2024-01-24 ENCOUNTER — Telehealth (HOSPITAL_COMMUNITY): Payer: Self-pay

## 2024-01-24 DIAGNOSIS — F251 Schizoaffective disorder, depressive type: Secondary | ICD-10-CM

## 2024-01-24 DIAGNOSIS — F411 Generalized anxiety disorder: Secondary | ICD-10-CM

## 2024-01-24 MED ORDER — HYDROXYZINE HCL 25 MG PO TABS: 25.0000 mg | ORAL_TABLET | Freq: Three times a day (TID) | ORAL | 3 refills | Status: DC

## 2024-01-24 MED ORDER — MIRTAZAPINE 45 MG PO TBDP: 45.0000 mg | ORAL_TABLET | Freq: Every day | ORAL | 3 refills | Status: DC

## 2024-01-24 MED ORDER — LYBALVI 5-10 MG PO TABS: 5.0000 mg | ORAL_TABLET | Freq: Every evening | ORAL | 3 refills | Status: DC

## 2024-01-24 NOTE — Telephone Encounter (Signed)
 received fax that a prior auth was needed for the Lybaivi 5-10mg 

## 2024-01-24 NOTE — Telephone Encounter (Signed)
submitted the prior auth - pending 

## 2024-01-24 NOTE — Progress Notes (Signed)
 BH MD/PA/NP OP Progress Note Virtual Visit via Video Note  I connected with Devon Hernandez on 01/24/24 at  3:00 PM EDT by a video enabled telemedicine application and verified that I am speaking with the correct person using two identifiers.  Location: Patient: Home Provider: Clinic   I discussed the limitations of evaluation and management by telemedicine and the availability of in person appointments. The patient expressed understanding and agreed to proceed.  I provided 30 minutes of non-face-to-face time during this encounter.    01/24/2024 3:34 PM [name]  MRN:  [id number]  Chief Complaint: I no longer want invega   HPI: 28 year old male seen today for follow up psychiatric evaluation. He has a psychiatric history of  Paranoid Schizophrenia, PTSD, ODD, Schizoaffective disorder, ADHD, borderline personality disorder, Meth Abuse, Suicide Attempts and 3 Psychiatric Hospitalizations (last age 71 prior to his admission at [facility] on 09/21/2022). He is currently managed on Mirtazapine  30 milligrams nightly, hydroxyzine  50 mg nightly, and Invega  117 mg monthly. He informed Clinical research associate that he refused his last Invega  injection and does not wish to restart it. He notes that his other medications are effective in managing his psychiatric conditions.    Today he was well-groomed, pleasant, cooperative, and engaged in conversation.  On 01/15/2024 patient came to the clinic for his injection however refused it.  Per chart review patient described feeling like he was in limbo and in a fog.  Today patient notes that he feels so much better without his LAI.  He reports that he is able to be expressive, happy, and joyful.  Patient is smiling throughout the exam and laughing.  He informed Clinical research associate that he is looking forward to his birthday celebration tomorrow reporting that he and his family will be going out to dinner.    Since his last visit he reports that his anxiety, depression, and mood are well managed.   Today provider conducted GAD-7 and patient scored a 4.  Provider also conducted PHQ-9 the patient scored a 2.  He endorses adequate sleep and appetite.  Today he denies SI/HI/VAH, mania, or paranoia.   Patient was last hospitalized in May 2024.  He informed Clinical research associate that he wants to stay out of hospital and would like to trial another medication to help manage his mood.  He reports that he does not want to have mental decline without invega . Provider recommended oral Invega  however patient was not agreeable.  He was agreeable to trialing low-dose of Lybalvi .  At this time patient does not wish to continue Invega  Sustenna.  Invega  117 mg discontinued.  He was agreeable to trialing Lybalvi  5-10 mg nightly. Potential side effects of medication and risks vs benefits of treatment vs non-treatment were explained and discussed. All questions were answered.  He will continue other medications as prescribed.  No other concern for at this time.    Visit Diagnosis:    ICD-10-CM   1. Schizoaffective disorder, depressive type (HCC)  F25.1 OLANZapine-Samidorphan (LYBALVI ) 5-10 MG TABS    mirtazapine  (REMERON  SOL-TAB) 45 MG disintegrating tablet    2. Generalized anxiety disorder  F41.1 mirtazapine  (REMERON  SOL-TAB) 45 MG disintegrating tablet    hydrOXYzine  (ATARAX ) 25 MG tablet          Past Psychiatric History: Paranoid Schizophrenia, PTSD, ODD, Schizoaffective disorder, ADHD, borderline personality disorder, and Meth Abuse, and 4 Suicide Attempts and 3 Psychiatric Hospitalizations (last age 54 prior to his admission at [facility] on 09/21/2022)   Past Medical History:  Past Medical  History:  Diagnosis Date   ADHD (attention deficit hyperactivity disorder)    Allergy    Anxiety    Bipolar disorder (HCC)    Depression    Headache(784.0)    HIV (human immunodeficiency virus infection) (HCC) 07/05/2018   Immune deficiency disorder    Rectal abscess     Past Surgical History:  Procedure Laterality Date    TYMPANOSTOMY TUBE PLACEMENT      Family Psychiatric History:  Mother- Depression, Father- Depression and PTSD   Family History:  Family History  Problem Relation Age of Onset   Depression Mother    Hypertension Mother    Depression Father        Hx: PTSD   Diabetes Maternal Aunt    AVM Maternal Grandmother    Aneurysm Maternal Grandmother    Seizures Maternal Grandmother    Stomach cancer Neg Hx    Pancreatic cancer Neg Hx    Colon cancer Neg Hx    Liver disease Neg Hx    Esophageal cancer Neg Hx    Rectal cancer Neg Hx     Social History:  Social History   Socioeconomic History   Marital status: Single    Spouse name: Not on file   Number of children: Not on file   Years of education: Not on file   Highest education level: Not on file  Occupational History   Not on file  Tobacco Use   Smoking status: Every Day    Types: Cigars   Smokeless tobacco: Never   Tobacco comments:    Smokes 2 cigars a day but trying to quit cutting back.   Vaping Use   Vaping status: Former  Substance and Sexual Activity   Alcohol use: Not Currently   Drug use: Yes    Frequency: 7.0 times per week    Types: Marijuana   Sexual activity: Not Currently    Comment: DECLINED CONDOMS  Other Topics Concern   Not on file  Social History Narrative   Mom currently has full custody but Tayo has gone to live with his dad. Tonight was the first night that he was at dad's house. He admitted to marijuana use tonight, unclear when. Smoke exposure at home. No pets at home.   Social Drivers of Corporate investment banker Strain: Not on file  Food Insecurity: Not on file  Transportation Needs: Not on file  Physical Activity: Not on file  Stress: Not on file  Social Connections: Not on file    Allergies: No Known Allergies  Metabolic Disorder Labs: Lab Results  Component Value Date   HGBA1C 5.5 06/06/2023   MPG 111.15 09/21/2022   MPG 117 (H) 01/19/2013   Lab Results  Component  Value Date   PROLACTIN 36.3 (H) 12/01/2023   PROLACTIN 55.1 (H) 09/04/2023   Lab Results  Component Value Date   CHOL 186 10/09/2023   TRIG 105 10/09/2023   HDL 44 10/09/2023   CHOLHDL 4.2 10/09/2023   VLDL 68.3 06/06/2023   LDLCALC 121 (H) 10/09/2023   LDLCALC 77 06/06/2023   Lab Results  Component Value Date   TSH 1.030 06/12/2023   TSH 0.487 09/21/2022    Therapeutic Level Labs: No results found for: LITHIUM  No results found for: VALPROATE No results found for: CBMZ  Current Medications: Current Outpatient Medications  Medication Sig Dispense Refill   OLANZapine-Samidorphan (LYBALVI ) 5-10 MG TABS Take 5 mg by mouth at bedtime. 30 tablet 3   acetaminophen  (TYLENOL ) 325  MG tablet Take 650 mg by mouth every 6 (six) hours as needed.     bictegravir-emtricitabine -tenofovir  AF (BIKTARVY ) 50-200-25 MG TABS tablet Take 1 tablet by mouth daily. 90 tablet 3   hydrOXYzine  (ATARAX ) 25 MG tablet Take 1 tablet (25 mg total) by mouth 3 (three) times daily. 90 tablet 3   ibuprofen  (ADVIL ) 200 MG tablet Take 600 mg by mouth every 6 (six) hours as needed.     mirtazapine  (REMERON  SOL-TAB) 45 MG disintegrating tablet Take 1 tablet (45 mg total) by mouth at bedtime. 30 tablet 3   senna-docusate (SENOKOT-S) 8.6-50 MG tablet Take 1 tablet by mouth as directed.     Current Facility-Administered Medications  Medication Dose Route Frequency Provider Last Rate Last Admin   mupirocin  ointment (BACTROBAN ) 2 %   Topical BID [place] Emil Schanz, MD         Musculoskeletal: Strength & Muscle Tone: within normal limits and telehealth visit Gait & Station: normal, telehealth visit Patient leans: N/A  Psychiatric Specialty Exam: Review of Systems  There were no vitals taken for this visit.There is no height or weight on file to calculate BMI.  General Appearance: Well Groomed  Eye Contact:  Good  Speech:  Clear and Coherent and Normal Rate  Volume:  Normal  Mood:  Euthymic  Affect:   Appropriate and Congruent  Thought Process:  Coherent, Goal Directed, and Linear  Orientation:  Full (Time, Place, and Person)  Thought Content: WDL and Logical   Suicidal Thoughts:  No  Homicidal Thoughts:  No  Memory:  Immediate;   Good Recent;   Good Remote;   Good  Judgement:  Good  Insight:  Good  Psychomotor Activity:  Normal  Concentration:  Concentration: Good and Attention Span: Good  Recall:  Good  Fund of Knowledge: Good  Language: Good  Akathisia:  No  Handed:  Right  AIMS (if indicated): not done  Assets:  Communication Skills Desire for Improvement Financial Resources/Insurance Housing Intimacy Leisure Time Physical Health Social Support Vocational/Educational  ADL's:  Intact  Cognition: WNL  Sleep:  Good   Screenings: AIMS    Flowsheet Row Video Visit from 11/24/2023 in [facility] [address]  AIMS Total Score 1   AUDIT    Flowsheet Row [facility] Ad[facility] ([facility]  Alcohol Use Disorder Identification Test Final Score (AUDIT) 0 0   GAD-7    Flowsheet Row Video Visit from 01/24/2024 in [facility] Office Visit from 12/14/2023 in [facility] Video Visit from 11/24/2023 in [facility] Office Visit from 10/09/2023 in [facility] [place] Video Visit from 09/13/2023 in [facility]  Total GAD-7 Score 4 3 13 2 5    PHQ2-9    Flowsheet Row Video Visit from 01/24/2024 in [facility] Office Visit from 12/14/2023 in [facility] Video Visit from 11/24/2023 in [facility] Office Visit from 10/09/2023 in [facility]  [place]. [facility] Video Visit from 09/13/2023 in [facility]  PHQ-2 Total Score 0 2 3 1 1   PHQ-9 Total Score 2 5 17 3 2    Flowsheet Row Video Visit from 11/24/2023 in  [facility] Video Visit from 09/13/2023 in [facility] from 08/07/2023 in [facility]  C-SSRS RISK CATEGORY Moderate Risk Error: Q3, 4, or 5 should not be populated when Q2 is No Moderate Risk     Assessment and Plan: Patient reports that his mood, anxiety, depression, and sleep are well-managed without Inveag Sustenna. Patient was last hospitalized in May 2024.  He informed Clinical research associate that he wants to stay out of hospital and would like to trial another medication to help manage his mood.  Provider recommended oral Invega  however patient was not agreeable.  He was agreeable to trialing low-dose of Lybalvi  5-10 mg.  He will continue other medications as prescribed. 1. Schizoaffective disorder, depressive type (HCC)  Start- OLANZapine-Samidorphan (LYBALVI ) 5-10 MG TABS; Take 5 mg by mouth at bedtime.  Dispense: 30 tablet; Refill: 3 Continue- mirtazapine  (REMERON  SOL-TAB) 45 MG disintegrating tablet; Take 1 tablet (45 mg total) by mouth at bedtime.  Dispense: 30 tablet; Refill: 3  2. Generalized anxiety disorder  Continue- mirtazapine  (REMERON  SOL-TAB) 45 MG disintegrating tablet; Take 1 tablet (45 mg total) by mouth at bedtime.  Dispense: 30 tablet; Refill: 3 Continue- hydrOXYzine  (ATARAX ) 25 MG tablet; Take 1 tablet (25 mg total) by mouth 3 (three) times daily.  Dispense: 90 tablet; Refill: 3   Collaboration of Care: Collaboration of Care: Other provider involved in patient's care AEB PCP  Patient/Guardian was advised Release of Information must be obtained prior to any record release in order to collaborate their care with an outside provider. Patient/Guardian was advised if they have not  already done so to contact the registration department to sign all necessary forms in order for us  to release information regarding their care.   Consent: Patient/Guardian gives verbal consent for treatment and assignment of benefits for services provided during this visit. Patient/Guardian expressed understanding and agreed to proceed.   Follow up in 2 months Zane FORBES Bach, NP 01/24/2024, 3:34 PM

## 2024-01-25 NOTE — Telephone Encounter (Signed)
 Prior shara was approved until 05-01-24

## 2024-01-25 NOTE — Telephone Encounter (Signed)
 left message requesting pt to call office back and to speak with Albino

## 2024-01-25 NOTE — Telephone Encounter (Signed)
 received fax asking if pt is still on opioids. (appears that maybe in may will call pt to make sure)

## 2024-01-26 ENCOUNTER — Other Ambulatory Visit: Payer: Self-pay | Admitting: Emergency Medicine

## 2024-01-26 MED ORDER — SCOPOLAMINE 1 MG/3DAYS TD PT72: 1.0000 | MEDICATED_PATCH | TRANSDERMAL | 12 refills | Status: AC

## 2024-01-26 NOTE — Telephone Encounter (Signed)
 Prescription for scopolamine  transdermal patches sent to pharmacy of record today.

## 2024-01-29 NOTE — Telephone Encounter (Signed)
L/M for pt rx sent

## 2024-01-30 ENCOUNTER — Ambulatory Visit (INDEPENDENT_AMBULATORY_CARE_PROVIDER_SITE_OTHER)

## 2024-01-30 DIAGNOSIS — L84 Corns and callosities: Secondary | ICD-10-CM | POA: Diagnosis not present

## 2024-01-30 DIAGNOSIS — D239 Other benign neoplasm of skin, unspecified: Secondary | ICD-10-CM

## 2024-01-30 NOTE — Progress Notes (Signed)
 Subjective:  Patient ID: Devon Hernandez, male    DOB: Apr 06, 1996,  MRN: [id number]  Chief Complaint  Patient presents with   Callouses    Rm 12 Callouses bilateral with pain when walking and pressure.    28 y.o. male presents with the above complaint.  He states that he has had calluses on the bottom of his feet for an extended period of time.  He has them debrided before which did allow for relief.  They did grow back over time.  He said that currently, his right subfifth met head callus is most painful.  He uses a pumice stone at home but has not tried other therapies.  Review of Systems: Negative except as noted in the HPI. Denies N/V/F/Ch.  Past Medical History:  Diagnosis Date   ADHD (attention deficit hyperactivity disorder)    Allergy    Anxiety    Bipolar disorder (HCC)    Depression    Headache(784.0)    HIV (human immunodeficiency virus infection) (HCC) 07/05/2018   Immune deficiency disorder    Rectal abscess     Current Outpatient Medications:    acetaminophen  (TYLENOL ) 325 MG tablet, Take 650 mg by mouth every 6 (six) hours as needed., Disp: , Rfl:    bictegravir-emtricitabine -tenofovir  AF (BIKTARVY ) 50-200-25 MG TABS tablet, Take 1 tablet by mouth daily., Disp: 90 tablet, Rfl: 3   hydrOXYzine  (ATARAX ) 25 MG tablet, Take 1 tablet (25 mg total) by mouth 3 (three) times daily., Disp: 90 tablet, Rfl: 3   ibuprofen  (ADVIL ) 200 MG tablet, Take 600 mg by mouth every 6 (six) hours as needed., Disp: , Rfl:    mirtazapine  (REMERON  SOL-TAB) 45 MG disintegrating tablet, Take 1 tablet (45 mg total) by mouth at bedtime., Disp: 30 tablet, Rfl: 3   OLANZapine-Samidorphan (LYBALVI ) 5-10 MG TABS, Take 5 mg by mouth at bedtime., Disp: 30 tablet, Rfl: 3   scopolamine  (TRANSDERM-SCOP) 1 MG/3DAYS, Place 1 patch (1 mg total) onto the skin every 3 (three) days., Disp: 10 patch, Rfl: 12   senna-docusate (SENOKOT-S) 8.6-50 MG tablet, Take 1 tablet by mouth as directed., Disp: , Rfl:   Current  Facility-Administered Medications:    mupirocin  ointment (BACTROBAN ) 2 %, , Topical, BID, Sagardia, Emil Schanz, MD  Social History   Tobacco Use  Smoking Status Every Day   Types: Cigars  Smokeless Tobacco Never  Tobacco Comments   Smokes 2 cigars a day but trying to quit cutting back.     No Known Allergies Objective:  There were no vitals filed for this visit. There is no height or weight on file to calculate BMI. Constitutional Well developed. Well nourished. Oriented to person, place, and time.  Vascular Dorsalis pedis pulses palpable bilaterally. Posterior tibial pulses palpable bilaterally. Capillary refill normal to all digits.  No cyanosis or clubbing noted. Pedal hair growth normal.  Neurologic Normal speech. Epicritic sensation to light touch grossly present bilaterally. Negative tinel sign at tarsal tunnel bilaterally.   Dermatologic Skin texture and turgor are within normal limits.  No open wounds. Hyperkeratotic lesions without central core or any surrounding erythema, edema or signs of infection are noted to the bilateral subfifth metatarsal head, medial aspect of right hallux interphalangeal joint.  Musculoskeletal: 5/5 muscle strength to all major pedal muscle groups. No contributing deformity.    Assessment:   1. Corns and callosities   2. Benign epithelial neoplasm of skin    Plan:  - Patient was evaluated and treated and all questions answered.  Benign epithelial neoplasm of skin - Discussed with the patient the diagnosis of benign epithelial neoplasm of skin/corns and callosities.  The patient does tend to develop these quickly.  We discussed different treatment options.  Today, I debrided the lesion as described above.  No incident occurred, patient expressed satisfaction following debridement - We did discuss that for long-term prevention, multiple things could be attempted.  Today, I dispensed him 2 pads with cut outs for the callused area.  He will  put these in his shoes.  We also discussed utilizing urea cream to keep the area soft.  He expresses understanding and will attempt this.  Return to clinic as needed   Prentice Ovens, [facility] AACF[facility] Fellowship Trained Podiatric Surgeon [facility]

## 2024-02-02 ENCOUNTER — Ambulatory Visit

## 2024-02-02 VITALS — Ht 70.0 in | Wt 148.0 lb

## 2024-02-02 DIAGNOSIS — Z0000 Encounter for general adult medical examination without abnormal findings: Secondary | ICD-10-CM

## 2024-02-02 NOTE — Progress Notes (Signed)
 Subjective:  Please attest and cosign this visit due to patients primary care provider not being in the office at the time the visit was completed.  (Pt of Dr CHRISTELLA. Purcell)   Devon Hernandez is a 28 y.o. who presents for a Medicare Wellness preventive visit.  As a reminder, Annual Wellness Visits don't include a physical exam, and some assessments may be limited, especially if this visit is performed virtually. We may recommend an in-person follow-up visit with your provider if needed.  Visit Complete: Virtual I connected with  Devon Hernandez on 02/02/24 by a audio enabled telemedicine application and verified that I am speaking with the correct person using two identifiers.  Patient Location: Home  Provider Location: Office/Clinic  I discussed the limitations of evaluation and management by telemedicine. The patient expressed understanding and agreed to proceed.  Vital Signs: Because this visit was a virtual/telehealth visit, some criteria may be missing or patient reported. Any vitals not documented were not able to be obtained and vitals that have been documented are patient reported.  VideoDeclined- This patient declined Librarian, academic. Therefore the visit was completed with audio only.  Persons Participating in Visit: Patient.  AWV Questionnaire: No: Patient Medicare AWV questionnaire was not completed prior to this visit.  Cardiac Risk Factors include: advanced age (>74men, >73 women);Other (see comment), Risk factor comments: HIV; ADHD; Bipolar     Objective:    Today's Vitals   02/02/24 1246  Weight: 148 lb (67.1 kg)  Height: 5' 10 (1.778 m)   Body mass index is 21.24 kg/m.     02/02/2024   12:45 PM 02/05/2023    3:22 PM 01/21/2023    2:43 PM 08/22/2020    2:08 PM 10/13/2018    4:16 AM 10/13/2018    3:34 AM 01/18/2018    3:50 AM  Advanced Directives  Does Patient Have a Medical Advance Directive? No No No No No No No   Would patient like  information on creating a medical advance directive? No - Patient declined   No - Patient declined No - Patient declined  No - Patient declined       Data saved with a previous flowsheet row definition    Current Medications (verified) Outpatient Encounter Medications as of 02/02/2024  Medication Sig   acetaminophen  (TYLENOL ) 325 MG tablet Take 650 mg by mouth every 6 (six) hours as needed.   bictegravir-emtricitabine -tenofovir  AF (BIKTARVY ) 50-200-25 MG TABS tablet Take 1 tablet by mouth daily.   hydrOXYzine  (ATARAX ) 25 MG tablet Take 1 tablet (25 mg total) by mouth 3 (three) times daily.   ibuprofen  (ADVIL ) 200 MG tablet Take 600 mg by mouth every 6 (six) hours as needed.   mirtazapine  (REMERON  SOL-TAB) 45 MG disintegrating tablet Take 1 tablet (45 mg total) by mouth at bedtime.   OLANZapine-Samidorphan (LYBALVI ) 5-10 MG TABS Take 5 mg by mouth at bedtime.   scopolamine  (TRANSDERM-SCOP) 1 MG/3DAYS Place 1 patch (1 mg total) onto the skin every 3 (three) days.   senna-docusate (SENOKOT-S) 8.6-50 MG tablet Take 1 tablet by mouth as directed.   Facility-Administered Encounter Medications as of 02/02/2024  Medication   mupirocin  ointment (BACTROBAN ) 2 %    Allergies (verified) Patient has no known allergies.   History: Past Medical History:  Diagnosis Date   ADHD (attention deficit hyperactivity disorder)    Allergy    Anxiety    Bipolar disorder (HCC)    Depression    Headache(784.0)  HIV (human immunodeficiency virus infection) (HCC) 07/05/2018   Immune deficiency disorder    Rectal abscess    Past Surgical History:  Procedure Laterality Date   TYMPANOSTOMY TUBE PLACEMENT     Family History  Problem Relation Age of Onset   Depression Mother    Hypertension Mother    Depression Father        Hx: PTSD   Diabetes Maternal Aunt    AVM Maternal Grandmother    Aneurysm Maternal Grandmother    Seizures Maternal Grandmother    Stomach cancer Neg Hx    Pancreatic cancer  Neg Hx    Colon cancer Neg Hx    Liver disease Neg Hx    Esophageal cancer Neg Hx    Rectal cancer Neg Hx    Social History   Socioeconomic History   Marital status: Single    Spouse name: Not on file   Number of children: Not on file   Years of education: Not on file   Highest education level: Not on file  Occupational History   Not on file  Tobacco Use   Smoking status: Every Day    Types: Cigars   Smokeless tobacco: Never   Tobacco comments:    Smokes 2 cigars a day but trying to quit cutting back.   Vaping Use   Vaping status: Former  Substance and Sexual Activity   Alcohol use: Not Currently   Drug use: Yes    Frequency: 7.0 times per week    Types: Marijuana   Sexual activity: Not Currently    Comment: DECLINED CONDOMS  Other Topics Concern   Not on file  Social History Narrative   Mom currently has full custody but Devon Hernandez has gone to live with his dad. Tonight was the first night that he was at dad's house. He admitted to marijuana use tonight, unclear when. Smoke exposure at home. No pets at home.   Social Drivers of Corporate investment banker Strain: Low Risk  (02/02/2024)   Overall Financial Resource Strain (CARDIA)    Difficulty of Paying Living Expenses: Not hard at all  Food Insecurity: No Food Insecurity (02/02/2024)   Hunger Vital Sign    Worried About Running Out of Food in the Last Year: Never true    Ran Out of Food in the Last Year: Never true  Transportation Needs: No Transportation Needs (02/02/2024)   PRAPARE - Administrator, Civil Service (Medical): No    Lack of Transportation (Non-Medical): No  Physical Activity: Inactive (02/02/2024)   Exercise Vital Sign    Days of Exercise per Week: 0 days    Minutes of Exercise per Session: 0 min  Stress: No Stress Concern Present (02/02/2024)   [employer] of Occupational Health - Occupational Stress Questionnaire    Feeling of Stress: Not at all  Social Connections: Socially Isolated  (02/02/2024)   Social Connection and Isolation Panel    Frequency of Communication with Friends and Family: More than three times a week    Frequency of Social Gatherings with Friends and Family: Once a week    Attends Religious Services: Never    Database administrator or Organizations: No    Attends Engineer, structural: Never    Marital Status: Never married    Tobacco Counseling Ready to quit: No Counseling given: Yes Tobacco comments: Smokes 2 cigars a day but trying to quit cutting back.     Clinical Intake:  Pre-visit preparation  completed: Yes  Pain : No/denies pain     BMI - recorded: 21.24 Nutritional Status: BMI of 19-24  Normal Nutritional Risks: None Diabetes: No  Lab Results  Component Value Date   HGBA1C 5.5 06/06/2023   HGBA1C 5.5 09/21/2022   HGBA1C 5.7 (H) 01/19/2013     How often do you need to have someone help you when you read instructions, pamphlets, or other written materials from your doctor or pharmacy?: 1 - Never  Interpreter Needed?: No  Information entered by :: Verdie Saba, CMA   Activities of Daily Living     02/02/2024   12:49 PM  In your present state of health, do you have any difficulty performing the following activities:  Hearing? 0  Vision? 0  Difficulty concentrating or making decisions? 0  Walking or climbing stairs? 0  Dressing or bathing? 0  Doing errands, shopping? 0  Preparing Food and eating ? N  Using the Toilet? N  In the past six months, have you accidently leaked urine? N  Do you have problems with loss of bowel control? N  Managing your Medications? N  Managing your Finances? N  Housekeeping or managing your Housekeeping? N    Patient Care Team: [facility] Emil Schanz, MD as PCP - General (Internal Medicine) Sheldon Standing, MD as Consulting Physician (General Surgery) Comer, Lamar ORN, MD as Consulting Physician (Infectious Diseases) Jonnalagadda, Janardhana, MD as Consulting Physician  (Psychiatry)  I have updated your Care Teams any recent Medical Services you may have received from other providers in the past year.     Assessment:   This is a routine wellness examination for Devon Hernandez.  Hearing/Vision screen Hearing Screening - Comments:: Denies hearing difficulties   Vision Screening - Comments:: Denies vision concerns    Goals Addressed               This Visit's Progress     Patient Stated (pt-stated)        Patient stated he has just celebrated a birthday and drinking much water  and staying healthy       Depression Screen     02/02/2024   12:50 PM 01/24/2024    3:08 PM 12/14/2023   10:08 AM 11/24/2023    2:14 PM 10/09/2023   11:57 AM 09/13/2023   10:22 AM 07/06/2023    3:03 PM  PHQ 2/9 Scores  PHQ - 2 Score 0  2  1  4   PHQ- 9 Score 1  5  3  14      Information is confidential and restricted. Go to Review Flowsheets to unlock data.    Fall Risk     02/02/2024   12:49 PM 12/14/2023   10:08 AM 10/09/2023   11:17 AM 07/06/2023    3:03 PM 06/06/2023    1:59 PM  Fall Risk   Falls in the past year? 0 0 0 0 0  Number falls in past yr: 0 0 0 0 0  Injury with Fall? 0 0 0 0 0  Risk for fall due to : No Fall Risks No Fall Risks No Fall Risks No Fall Risks No Fall Risks  Follow up Falls evaluation completed;Falls prevention discussed Falls evaluation completed Falls evaluation completed Falls evaluation completed Falls evaluation completed    MEDICARE RISK AT HOME:  Medicare Risk at Home Any stairs in or around the home?: Yes If so, are there any without handrails?: No Home free of loose throw rugs in walkways, pet beds, electrical cords, etc?:  Yes Adequate lighting in your home to reduce risk of falls?: Yes Life alert?: No Use of a cane, walker or w/c?: No Grab bars in the bathroom?: No Shower chair or bench in shower?: No Elevated toilet seat or a handicapped toilet?: No  TIMED UP AND GO:  Was the test performed?  No  Cognitive Function: 6CIT  completed        02/02/2024   12:54 PM  6CIT Screen  What Year? 0 points  What month? 0 points  What time? 0 points  Count back from 20 0 points  Months in reverse 0 points  Repeat phrase 0 points  Total Score 0 points    Immunizations Immunization History  Administered Date(s) Administered   DTaP 04/07/1997, 05/26/1997, 07/24/1997, 08/05/1998, 10/15/2001   HIB (PRP-OMP) 04/07/1997, 05/26/1997, 07/24/1997, 04/27/1998   HPV Quadrivalent 09/13/2012, 11/05/2012   Hepatitis B, PED/ADOLESCENT Jun 07, 1996, 04/07/1997, 10/27/1997   IPV 04/07/1997, 05/26/1997, 08/05/1998, 10/15/2001   Influenza, Seasonal, Injecte, Preservative Fre 02/14/2023   Influenza,inj,Quad PF,6+ Mos 01/27/2014, 01/21/2016   MMR 04/27/1998, 10/15/2001   Meningococcal Conjugate 09/27/2010   Pneumococcal Conjugate-13 07/23/2018   Pneumococcal Polysaccharide-23 08/25/2020   Tdap 11/09/2007   Varicella 04/27/1998, 09/27/2010    Screening Tests Health Maintenance  Topic Date Due   HPV VACCINES (3 - Risk male 3-dose series) 03/16/2013   DTaP/Tdap/Td (7 - Td or Tdap) 11/08/2017   Influenza Vaccine  12/01/2023   COVID-19 Vaccine (1) 04/30/2024 (Originally 01/24/2002)   Medicare Annual Wellness (AWV)  02/01/2025   Pneumococcal Vaccine (3 of 3 - PCV20 or PCV21) 08/25/2025   Hepatitis B Vaccines 19-59 Average Risk  Completed   Hepatitis C Screening  Completed   HIV Screening  Completed   Meningococcal B Vaccine  Aged Out    Health Maintenance Items Addressed:  02/02/2024  Additional Screening:  Vision Screening: Recommended annual ophthalmology exams for early detection of glaucoma and other disorders of the eye. Is the patient up to date with their annual eye exam?  No   Dental Screening: Recommended annual dental exams for proper oral hygiene  Community Resource Referral / Chronic Care Management: CRR required this visit?  No   CCM required this visit?  No   Plan:    I have personally reviewed and  noted the following in the patient's chart:   Medical and social history Use of alcohol, tobacco or illicit drugs  Current medications and supplements including opioid prescriptions. Patient is not currently taking opioid prescriptions. Functional ability and status Nutritional status Physical activity Advanced directives List of other physicians Hospitalizations, surgeries, and ER visits in previous 12 months Vitals Screenings to include cognitive, depression, and falls Referrals and appointments  In addition, I have reviewed and discussed with patient certain preventive protocols, quality metrics, and best practice recommendations. A written personalized care plan for preventive services as well as general preventive health recommendations were provided to patient.   Verdie CHRISTELLA Saba, CMA   02/02/2024   After Visit Summary: (MyChart) Due to this being a telephonic visit, the after visit summary with patients personalized plan was offered to patient via MyChart   Notes: Scheduled a 1-yr Physical w/PCP for 05/2024

## 2024-02-02 NOTE — Patient Instructions (Addendum)
 Mr. Devon Hernandez,  Thank you for taking the time for your Medicare Wellness Visit. I appreciate your continued commitment to your health goals. Please review the care plan we discussed, and feel free to reach out if I can assist you further.  Medicare recommends these wellness visits once per year to help you and your care team stay ahead of potential health issues. These visits are designed to focus on prevention, allowing your provider to concentrate on managing your acute and chronic conditions during your regular appointments.  Please note that Annual Wellness Visits do not include a physical exam. Some assessments may be limited, especially if the visit was conducted virtually. If needed, we may recommend a separate in-person follow-up with your provider.  Ongoing Care Seeing your primary care provider every 3 to 6 months helps us  monitor your health and provide consistent, personalized care.   Referrals If a referral was made during today's visit and you haven't received any updates within two weeks, please contact the referred provider directly to check on the status.  Recommended Screenings:  Health Maintenance  Topic Date Due   HPV Vaccine (3 - Risk male 3-dose series) 03/16/2013   DTaP/Tdap/Td vaccine (7 - Td or Tdap) 11/08/2017   Flu Shot  12/01/2023   COVID-19 Vaccine (1) 04/30/2024*   Medicare Annual Wellness Visit  02/01/2025   Pneumococcal Vaccine (3 of 3 - PCV20 or PCV21) 08/25/2025   Hepatitis B Vaccine  Completed   Hepatitis C Screening  Completed   HIV Screening  Completed   Meningitis B Vaccine  Aged Out  *Topic was postponed. The date shown is not the original due date.       02/02/2024   12:45 PM  Advanced Directives  Does Patient Have a Medical Advance Directive? No  Would patient like information on creating a medical advance directive? No - Patient declined   Advance Care Planning is important because it: Ensures you receive medical care that aligns with your  values, goals, and preferences. Provides guidance to your family and loved ones, reducing the emotional burden of decision-making during critical moments.  Vision: Annual vision screenings are recommended for early detection of glaucoma, cataracts, and diabetic retinopathy. These exams can also reveal signs of chronic conditions such as diabetes and high blood pressure.  Dental: Annual dental screenings help detect early signs of oral cancer, gum disease, and other conditions linked to overall health, including heart disease and diabetes.

## 2024-02-09 ENCOUNTER — Telehealth: Payer: Self-pay

## 2024-02-09 DIAGNOSIS — B20 Human immunodeficiency virus [HIV] disease: Secondary | ICD-10-CM

## 2024-02-09 DIAGNOSIS — Z79899 Other long term (current) drug therapy: Secondary | ICD-10-CM

## 2024-02-09 NOTE — Telephone Encounter (Signed)
 Triage staff called patient to confirm pharmacy managing medication Biktarvy . Staff received a fax from [facility]  ([address] [place], [place] [address]; fax: [phone]) pharmacy to process 90 day supply. Patient recently filled at [facility]. Awaiting call return by patient to confirm pharmacy.  Enis Kleine, LPN

## 2024-02-20 ENCOUNTER — Telehealth (HOSPITAL_COMMUNITY): Payer: Self-pay

## 2024-02-20 MED ORDER — BIKTARVY 50-200-25 MG PO TABS: 1.0000 | ORAL_TABLET | Freq: Every day | ORAL | 3 refills | Status: AC

## 2024-02-20 NOTE — Addendum Note (Signed)
 Addended by: CECILIE PAIR on: 02/20/2024 01:21 PM   Modules accepted: Orders

## 2024-02-20 NOTE — Telephone Encounter (Signed)
 Patient reports side effects of new medication OLANZapine-Samidorphan (LYBALVI ) 5-10 MG TABS for a week and a half. Per patient he has a Lump -size of pebble behind right nipple, intense upper left arm cramping at night that goes down to hand. Poor sleep not falling asleep until 4 am & waking up at 1 pm.

## 2024-02-21 NOTE — Telephone Encounter (Signed)
 Patient notes that he has been taking Lybalvi  for the last 2 to 3 weeks.  He informed Clinical research associate that recently he noticed a pimple sized lump on his chest.  He also reports that he has cramping in his arms.  Provider recommended patient go to the ED if this persist.  Provider also informed patient that he can be seen in the clinic on 02/22/2024.  Patient however requested to have an appointment the following week it was scheduled for 02/27/2024.  Patient notes that his sleep has been impacted reporting that he sleeps 3-4 hours at a that time.  He denies having symptoms of anxiety and depression.  Mirtazapine  currently at its highest dose.  He does note that hydroxyzine  makes him sleepy.  Provider recommend taking 50 to 100 mg of hydroxyzine  nightly.  He endorsed understanding and agreed.  No other concerns at this time.

## 2024-02-27 ENCOUNTER — Ambulatory Visit: Payer: Self-pay

## 2024-02-27 ENCOUNTER — Encounter (HOSPITAL_COMMUNITY): Payer: Self-pay | Admitting: Psychiatry

## 2024-02-27 ENCOUNTER — Telehealth (INDEPENDENT_AMBULATORY_CARE_PROVIDER_SITE_OTHER): Admitting: Psychiatry

## 2024-02-27 DIAGNOSIS — F251 Schizoaffective disorder, depressive type: Secondary | ICD-10-CM | POA: Diagnosis not present

## 2024-02-27 DIAGNOSIS — F411 Generalized anxiety disorder: Secondary | ICD-10-CM

## 2024-02-27 MED ORDER — QUETIAPINE FUMARATE 50 MG PO TABS: 50.0000 mg | ORAL_TABLET | Freq: Every day | ORAL | 3 refills | Status: DC

## 2024-02-27 MED ORDER — HYDROXYZINE HCL 50 MG PO TABS: 50.0000 mg | ORAL_TABLET | Freq: Three times a day (TID) | ORAL | 3 refills | Status: DC

## 2024-02-27 MED ORDER — MIRTAZAPINE 45 MG PO TBDP: 45.0000 mg | ORAL_TABLET | Freq: Every day | ORAL | 3 refills | Status: DC

## 2024-02-27 NOTE — Telephone Encounter (Signed)
 FYI Only or Action Required?: FYI only for provider.  Patient was last seen in primary care on 12/14/2023 by [facility] Emil Schanz, MD.  Called Nurse Triage reporting Mass on chest - not really a lump but a dime to nickel sized painful area.  Symptoms began several weeks ago.  Interventions attempted: Nothing.  Symptoms are: gradually worsening.  Triage Disposition: See Physician Within 24 Hours  Patient/caregiver understands and will follow disposition?: Yes - Appt for tomorrow afternoon.                      Copied from CRM #[id number]. Topic: Clinical - Red Word Triage >> Feb 27, 2024 11:31 AM Terri MATSU wrote: Red Word that prompted transfer to Nurse Triage: Patient has a lump on his right side chest that is painful and growing Reason for Disposition  [1] Swelling is painful to touch AND [2] no fever  Answer Assessment - Initial Assessment Questions 1. APPEARANCE of SWELLING: What does it look like?     Not  swelling - no redness 2. SIZE: How large is the swelling? (e.g., inches, cm; or compare to size of pinhead, tip of pen, eraser, coin, pea, grape, ping pong ball)      Nickel - dime - have to touch to feel it. 3. LOCATION: Where is the swelling located?     On breast area behind the nipple 4. ONSET: When did the swelling start?     2 weeks 5. COLOR: What color is it? Is there more than one color?     no 6. PAIN: Is there any pain? If Yes, ask: How bad is the pain? (Scale 1-10; or mild, moderate, severe)       yes 7. ITCH: Does it itch? If Yes, ask: How bad is the itch?      no 8. CAUSE: What do you think caused the swelling?     Medication  - OLANZapine-Samidorphan (LYBALVI ) 5-10 MG TABS [[id number]] 9 OTHER SYMPTOMS: Do you have any other symptoms? (e.g., fever)     Arm cramping at night  Protocols used: Skin Lump or Localized Swelling-A-AH

## 2024-02-27 NOTE — Progress Notes (Signed)
 BH MD/PA/NP OP Progress Note Virtual Visit via Video Note  I connected with Devon Hernandez on 02/27/24 at 11:00 AM EDT by a video enabled telemedicine application and verified that I am speaking with the correct person using two identifiers.  Location: Patient: Home Provider: Clinic   I discussed the limitations of evaluation and management by telemedicine and the availability of in person appointments. The patient expressed understanding and agreed to proceed.  I provided 30 minutes of non-face-to-face time during this encounter.    02/27/2024 12:02 PM Devon Hernandez  MRN:  [id number]  Chief Complaint: I think Lybalvi  caused this lump  HPI: 28 year old male seen today for follow up psychiatric evaluation. He has a psychiatric history of  Paranoid Schizophrenia, PTSD, ODD, Schizoaffective disorder, ADHD, borderline personality disorder, Meth Abuse, Suicide Attempts and 3 Psychiatric Hospitalizations ( age 85 prior to his admission at [facility] on 09/21/2022). He is currently managed on Mirtazapine  45 milligrams nightly, hydroxyzine  50-100 mg nightly, and Lybalvi  5-10 mg daily. He informed clinical research associate that he no longer wants to take Lybalvi . He notes that his other medications are somewhat effective in managing his psychiatric conditions.    Today he was well-groomed, pleasant, cooperative, and engaged in conversation.  Patient informed writer that he has a lump on his chest and notes that it was growing.  Provider informed patient that she would like to see him in person to evaluate.  Writer also encouraged patient to follow-up with his PCP.  He was able to get in contact with his provider while clinical research associate.  Patient informed clinical research associate that  he has an appoint with his primary care doctor on 02/28/2024. Today he reports that this lump is painful and quantifies his pain as 10 out of 10.  He is not taking medications to help manage it.  Patient believes that the lump developed after starting Lybalvi .  Patient  denies milk leakage, rash, fever, shortness of breath, or swelling.  He also denies more common side effects such as tremors, N/V, restlessness, dizziness, headaches, or increased appetite.  Provider informed patient that she is uncertain if Lybalvi  is the cause but was empathetic. Provider informed patient that his lymph node could be inflamed put again encouraged him to come in and be seen. At this time he wants to discontinue it.  He does note that his mood is more stable and reports that he has minimal anxiety and depression.  Patient recently discontinued Invega  Sustenna 117.    Since his last visit he reports that his anxiety, depression, and mood are well managed.  Today provider conducted GAD-7 and patient scored an 8, at her last visit she scored a 4.  Provider also conducted PHQ-9 the patient scored a 6, at her last visit she score a 2.  He endorses adequate poor sleep and adequate appetite.  Today he denies SI/HI/VAH, mania, or paranoia.   Provider asked patient if he was using illegal substances.  He notes that he relapsed on methamphetamines a week ago.  This time patient is not interested in [place].    At this time patient does not wish to continue Lybalvi . Lybalvi  discontinued. He will trial Seroquel 50 mg nightly to help manage sleep.  Today provider prolactin level, CBC, CMP, LFT, thyroid  panel, lipid panel, and UDS.  Potential side effects of medication and risks vs benefits of treatment vs non-treatment were explained and discussed. All questions were answered.  He will continue other medications as prescribed.  No other concern for at  this time.    Visit Diagnosis:    ICD-10-CM   1. Generalized anxiety disorder  F41.1 hydrOXYzine  (ATARAX ) 50 MG tablet    mirtazapine  (REMERON  SOL-TAB) 45 MG disintegrating tablet    2. Schizoaffective disorder, depressive type (HCC)  F25.1 QUEtiapine (SEROQUEL) 50 MG tablet    Prolactin    CBC with Differential/Platelet    Comprehensive Metabolic  Panel (CMET)    Hepatic function panel    Thyroid  Panel With TSH    Lipid panel    Urine Drug Panel 7    mirtazapine  (REMERON  SOL-TAB) 45 MG disintegrating tablet    Urine Drug Panel 7    Lipid panel    Thyroid  Panel With TSH    Hepatic function panel    Comprehensive Metabolic Panel (CMET)    CBC with Differential/Platelet    Prolactin           Past Psychiatric History: Paranoid Schizophrenia, PTSD, ODD, Schizoaffective disorder, ADHD, borderline personality disorder, and Meth Abuse, and 4 Suicide Attempts and 3 Psychiatric Hospitalizations (last age 73 prior to his admission at [facility] on 09/21/2022)   Past Medical History:  Past Medical History:  Diagnosis Date   ADHD (attention deficit hyperactivity disorder)    Allergy    Anxiety    Bipolar disorder (HCC)    Depression    Headache(784.0)    HIV (human immunodeficiency virus infection) (HCC) 07/05/2018   Immune deficiency disorder    Rectal abscess     Past Surgical History:  Procedure Laterality Date   TYMPANOSTOMY TUBE PLACEMENT      Family Psychiatric History:  Mother- Depression, Father- Depression and PTSD   Family History:  Family History  Problem Relation Age of Onset   Depression Mother    Hypertension Mother    Depression Father        Hx: PTSD   Diabetes Maternal Aunt    AVM Maternal Grandmother    Aneurysm Maternal Grandmother    Seizures Maternal Grandmother    Stomach cancer Neg Hx    Pancreatic cancer Neg Hx    Colon cancer Neg Hx    Liver disease Neg Hx    Esophageal cancer Neg Hx    Rectal cancer Neg Hx     Social History:  Social History   Socioeconomic History   Marital status: Single    Spouse name: Not on file   Number of children: Not on file   Years of education: Not on file   Highest education level: Not on file  Occupational History   Not on file  Tobacco Use   Smoking status: Every Day    Types: Cigars   Smokeless tobacco: Never   Tobacco comments:    Smokes 2  cigars a day but trying to quit cutting back.   Vaping Use   Vaping status: Former  Substance and Sexual Activity   Alcohol use: Not Currently   Drug use: Yes    Frequency: 7.0 times per week    Types: Marijuana   Sexual activity: Not Currently    Comment: DECLINED CONDOMS  Other Topics Concern   Not on file  Social History Narrative   Mom currently has full custody but Tyleek has gone to live with his dad. Tonight was the first night that he was at dad's house. He admitted to marijuana use tonight, unclear when. Smoke exposure at home. No pets at home.   Social Drivers of Health   Financial Resource Strain: Low Risk  (02/02/2024)  Overall Financial Resource Strain (CARDIA)    Difficulty of Paying Living Expenses: Not hard at all  Food Insecurity: No Food Insecurity (02/02/2024)   Hunger Vital Sign    Worried About Running Out of Food in the Last Year: Never true    Ran Out of Food in the Last Year: Never true  Transportation Needs: No Transportation Needs (02/02/2024)   PRAPARE - Administrator, Civil Service (Medical): No    Lack of Transportation (Non-Medical): No  Physical Activity: Inactive (02/02/2024)   Exercise Vital Sign    Days of Exercise per Week: 0 days    Minutes of Exercise per Session: 0 min  Stress: No Stress Concern Present (02/02/2024)   [employer] of Occupational Health - Occupational Stress Questionnaire    Feeling of Stress: Not at all  Social Connections: Socially Isolated (02/02/2024)   Social Connection and Isolation Panel    Frequency of Communication with Friends and Family: More than three times a week    Frequency of Social Gatherings with Friends and Family: Once a week    Attends Religious Services: Never    Database Administrator or Organizations: No    Attends Engineer, Structural: Never    Marital Status: Never married    Allergies: No Known Allergies  Metabolic Disorder Labs: Lab Results  Component Value Date    HGBA1C 5.5 06/06/2023   MPG 111.15 09/21/2022   MPG 117 (H) 01/19/2013   Lab Results  Component Value Date   PROLACTIN 36.3 (H) 12/01/2023   PROLACTIN 55.1 (H) 09/04/2023   Lab Results  Component Value Date   CHOL 186 10/09/2023   TRIG 105 10/09/2023   HDL 44 10/09/2023   CHOLHDL 4.2 10/09/2023   VLDL 68.3 06/06/2023   LDLCALC 121 (H) 10/09/2023   LDLCALC 77 06/06/2023   Lab Results  Component Value Date   TSH 1.030 06/12/2023   TSH 0.487 09/21/2022    Therapeutic Level Labs: No results found for: LITHIUM  No results found for: VALPROATE No results found for: CBMZ  Current Medications: Current Outpatient Medications  Medication Sig Dispense Refill   QUEtiapine (SEROQUEL) 50 MG tablet Take 1 tablet (50 mg total) by mouth at bedtime. 30 tablet 3   acetaminophen  (TYLENOL ) 325 MG tablet Take 650 mg by mouth every 6 (six) hours as needed.     bictegravir-emtricitabine -tenofovir  AF (BIKTARVY ) 50-200-25 MG TABS tablet Take 1 tablet by mouth daily. 90 tablet 3   hydrOXYzine  (ATARAX ) 50 MG tablet Take 1-2 tablets (50-100 mg total) by mouth 3 (three) times daily. 180 tablet 3   ibuprofen  (ADVIL ) 200 MG tablet Take 600 mg by mouth every 6 (six) hours as needed.     mirtazapine  (REMERON  SOL-TAB) 45 MG disintegrating tablet Take 1 tablet (45 mg total) by mouth at bedtime. 30 tablet 3   scopolamine  (TRANSDERM-SCOP) 1 MG/3DAYS Place 1 patch (1 mg total) onto the skin every 3 (three) days. 10 patch 12   senna-docusate (SENOKOT-S) 8.6-50 MG tablet Take 1 tablet by mouth as directed.     Current Facility-Administered Medications  Medication Dose Route Frequency Provider Last Rate Last Admin   mupirocin  ointment (BACTROBAN ) 2 %   Topical BID Purcell Emil Schanz, MD         Musculoskeletal: Strength & Muscle Tone: within normal limits and telehealth visit Gait & Station: normal, telehealth visit Patient leans: N/A  Psychiatric Specialty Exam: Review of Systems  There were no  vitals taken for this  visit.There is no height or weight on file to calculate BMI.  General Appearance: Well Groomed  Eye Contact:  Good  Speech:  Clear and Coherent and Normal Rate  Volume:  Normal  Mood:  Euthymic  Affect:  Appropriate, Congruent, and Tearful  Thought Process:  Coherent, Goal Directed, and Linear  Orientation:  Full (Time, Place, and Person)  Thought Content: WDL and Logical   Suicidal Thoughts:  No  Homicidal Thoughts:  No  Memory:  Immediate;   Good Recent;   Good Remote;   Good  Judgement:  Good  Insight:  Good  Psychomotor Activity:  Normal  Concentration:  Concentration: Good and Attention Span: Good  Recall:  Good  Fund of Knowledge: Good  Language: Good  Akathisia:  No  Handed:  Right  AIMS (if indicated): not done  Assets:  Communication Skills Desire for Improvement Financial Resources/Insurance Housing Intimacy Leisure Time Physical Health Social Support Vocational/Educational  ADL's:  Intact  Cognition: WNL  Sleep:  Poor   Screenings: AIMS    Flowsheet Row Video Visit from 11/24/2023 in [facility]  AIMS Total Score 1   AUDIT    Flowsheet Row [facility] Ad[facility] ([facility]  Alcohol Use Disorder Identification Test Final Score (AUDIT) 0 0   GAD-7    Flowsheet Row Video Visit from 02/27/2024 in [facility] Video Visit from 01/24/2024 in [facility] Office Visit from 12/14/2023 in [facility] Video Visit from 11/24/2023 in [facility] Office Visit from 10/09/2023 in [facility]  Total GAD-7 Score 8 4 3 13 2    PHQ2-9    Flowsheet Row Video Visit from 02/27/2024 in [facility] Clinical Support from 02/02/2024 in [facility] Video Visit from 01/24/2024 in [facility] Office Visit from 12/14/2023 in [facility] Video Visit from 11/24/2023 in [facility]  PHQ-2 Total Score 2 0 0 2 3  PHQ-9 Total Score 6 1 2 5 17    Flowsheet Row Video Visit from 11/24/2023 in [facility] Video Visit from 09/13/2023 in [facility] from 08/07/2023 in [facility])  C-SSRS RISK CATEGORY Moderate Risk Error: Q3, 4, or 5 should not be populated when Q2 is No Moderate Risk     Assessment and Plan: Patient reports that his mood, anxiety, and depression continues to be well-managed.  He however is concerned about a lump that developed on his chest after starting Lybalvi .  At this time patient does not wish to continue the medication Lybalvi  discontinued.  Patient continues to have poor sleep noting that he sleeps 3 to 4 hours nightly.  At this time patient does not wish to continue Lybalvi . Lybalvi  discontinued. He will trial Seroquel 50 mg nightly to help manage sleep.  Today provider prolactin level, CBC, CMP, LFT, thyroid  panel, lipid panel, and UDS.  Patient has an appoint with his primary care doctor on 02/28/2024.   1. Generalized anxiety disorder  - hydrOXYzine  (ATARAX ) 50 MG tablet; Take 1-2 tablets (50-100 mg total) by mouth 3 (three) times daily.  Dispense: 180 tablet; Refill: 3 -  mirtazapine  (REMERON  SOL-TAB) 45 MG disintegrating tablet; Take 1 tablet (45 mg total) by mouth at bedtime.  Dispense: 30 tablet; Refill: 3  2. Schizoaffective disorder, depressive type (HCC)  Start- QUEtiapine (SEROQUEL) 50 MG tablet; Take 1 tablet (50 mg total) by mouth at bedtime.  Dispense: 30 tablet; Refill: 3 - Prolactin; Future - CBC with Differential/Platelet;  Future - Comprehensive Metabolic Panel (CMET); Future - Hepatic function panel; Future - Thyroid  Panel With TSH; Future - Lipid panel; Future - Urine Drug Panel 7; Future Continue- mirtazapine  (REMERON  SOL-TAB) 45 MG disintegrating tablet; Take 1 tablet (45 mg total) by mouth at bedtime.  Dispense: 30 tablet; Refill: 3 - Urine Drug Panel 7 - Lipid panel - Thyroid  Panel With TSH - Hepatic function panel - Comprehensive Metabolic Panel (CMET) - CBC with Differential/Platelet - Prolactin Level   Collaboration of Care: Collaboration of Care: Other provider involved in patient's care AEB PCP  Patient/Guardian was advised Release of Information must be obtained prior to any record release in order to collaborate their care with an outside provider. Patient/Guardian was advised if they have not already done so to contact the registration department to sign all necessary forms in order for us  to release information regarding their care.   Consent: Patient/Guardian gives verbal consent for treatment and assignment of benefits for services provided during this visit. Patient/Guardian expressed understanding and agreed to proceed.   Follow up in 2 months Zane FORBES Bach, NP 02/27/2024, 12:02 PM

## 2024-02-28 ENCOUNTER — Ambulatory Visit: Payer: Self-pay

## 2024-02-28 ENCOUNTER — Ambulatory Visit: Admitting: Emergency Medicine

## 2024-02-28 NOTE — Telephone Encounter (Signed)
 FYI Only or Action Required?: FYI only for provider: appointment scheduled on 11/3.  Patient was last seen in primary care on 12/14/2023 by Purcell Emil Schanz, MD.  Called Nurse Triage reporting [place].  Symptoms began several weeks ago.  Symptoms are: improved since yesterday.  Triage Disposition: See PCP When Office is Open (Within 3 Days)  Patient/caregiver understands and will follow disposition?: Yes with modifications, appointment scheduled for 11/3    Copied from CRM #[id number]. Topic: Clinical - Red Word Triage >> Feb 28, 2024  2:40 PM Winona R wrote: Pt calling to resch his acute appointment due to transportation issues for  Lump on chest which is painful and also growing      Reason for Disposition  Nursing judgment or information in reference  Answer Assessment - Initial Assessment Questions 1. REASON FOR CALL: What is your main concern right now?     Patient needs to reschedule due to transportation. Patient states his pain is improved from yesterday and states he would not be able to come in until next week. Appointment scheduled for 11/3. Patient instructed to call back for new or worsening symptoms. Patient verbalized understanding and agreement with this plan.  Protocols used: No Guideline Available-A-AH

## 2024-02-29 ENCOUNTER — Other Ambulatory Visit

## 2024-03-04 ENCOUNTER — Ambulatory Visit: Admitting: Emergency Medicine

## 2024-03-05 ENCOUNTER — Encounter: Payer: Self-pay | Admitting: Emergency Medicine

## 2024-03-05 ENCOUNTER — Ambulatory Visit (INDEPENDENT_AMBULATORY_CARE_PROVIDER_SITE_OTHER): Admitting: Emergency Medicine

## 2024-03-05 VITALS — BP 126/68 | HR 102 | Temp 98.7°F | Ht 70.0 in | Wt 153.0 lb

## 2024-03-05 DIAGNOSIS — N63 Unspecified lump in unspecified breast: Secondary | ICD-10-CM | POA: Insufficient documentation

## 2024-03-05 NOTE — Progress Notes (Signed)
 Devon Hernandez 28 y.o.   Chief Complaint  Patient presents with   Mass    Slight tenderness when touching it located on the right side of chest near the nipple     HISTORY OF PRESENT ILLNESS: This is a 28 y.o. male complaining of slightly tender mass to right breast/nipple area for the last several weeks  No other complaints or medical concerns today.  HPI   Prior to Admission medications   Medication Sig Start Date End Date Taking? Authorizing Provider  acetaminophen  (TYLENOL ) 325 MG tablet Take 650 mg by mouth every 6 (six) hours as needed.   Yes [provider]  bictegravir-emtricitabine -tenofovir  AF (BIKTARVY ) 50-200-25 MG TABS tablet Take 1 tablet by mouth daily. 02/20/24  Yes Manandhar, Sabina, MD  hydrOXYzine  (ATARAX ) 50 MG tablet Take 1-2 tablets (50-100 mg total) by mouth 3 (three) times daily. 02/27/24  Yes Harl Regan E, NP  ibuprofen  (ADVIL ) 200 MG tablet Take 600 mg by mouth every 6 (six) hours as needed.   Yes [provider]  mirtazapine  (REMERON  SOL-TAB) 45 MG disintegrating tablet Take 1 tablet (45 mg total) by mouth at bedtime. 02/27/24  Yes Harl Regan E, NP  QUEtiapine (SEROQUEL) 50 MG tablet Take 1 tablet (50 mg total) by mouth at bedtime. 02/27/24  Yes Harl Regan E, NP  scopolamine  (TRANSDERM-SCOP) 1 MG/3DAYS Place 1 patch (1 mg total) onto the skin every 3 (three) days. 01/26/24  Yes Nilton Lave, Emil Schanz, MD  senna-docusate (SENOKOT-S) 8.6-50 MG tablet Take 1 tablet by mouth as directed. 10/13/22  Yes [provider]    No Known Allergies  Patient Active Problem List   Diagnosis Date Noted   Human immunodeficiency virus (HIV) disease (HCC) 10/12/2023   HIV disease (HCC) 10/09/2023   Tobacco abuse 07/17/2023   Bipolar affective disorder, current episode depressed (HCC) 09/21/2022   Borderline personality disorder (HCC) 09/21/2022   Methamphetamine abuse (HCC) 09/21/2022   Cannabis use disorder, severe, in  controlled environment, dependence (HCC) 09/21/2022   Schizoaffective disorder (HCC) 09/21/2022   Schizoaffective disorder, depressive type (HCC) 09/21/2022   Depression 03/31/2021   Major depressive disorder, recurrent episode, mild 10/13/2018   Human immunodeficiency virus (HIV) disease (HCC) 07/23/2018   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   Polysubstance abuse (HCC) 07/11/2013   MDD (major depressive disorder), recurrent episode, severe (HCC) 01/18/2013   ADHD (attention deficit hyperactivity disorder), combined type 01/18/2013   Conduct disorder, 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    Past Medical History:  Diagnosis Date   ADHD (attention deficit hyperactivity disorder)    Allergy    Anxiety    Bipolar disorder (HCC)    Depression    Headache(784.0)    HIV (human immunodeficiency virus infection) (HCC) 07/05/2018   Immune deficiency disorder    Rectal abscess     Past Surgical History:  Procedure Laterality Date   TYMPANOSTOMY TUBE PLACEMENT      Social History   Socioeconomic History   Marital status: Single    Spouse name: Not on file   Number of children: Not on file   Years of education: Not on file   Highest education level: Not on file  Occupational History   Not on file  Tobacco Use   Smoking status: Every Day    Types: Cigars   Smokeless tobacco: Never   Tobacco comments:    Smokes 2 cigars a day but trying to quit cutting back.   Vaping Use   Vaping status:  Former  Substance and Sexual Activity   Alcohol use: Not Currently   Drug use: Yes    Frequency: 7.0 times per week    Types: Marijuana   Sexual activity: Not Currently    Comment: DECLINED CONDOMS  Other Topics Concern   Not on file  Social History Narrative   Mom currently has full custody but Devon Hernandez has gone to live with his dad. Tonight was the first night that he was at dad's house. He admitted to marijuana use tonight, unclear when. Smoke exposure at home. No pets at  home.   Social Drivers of Corporate Investment Banker Strain: Low Risk  (02/02/2024)   Overall Financial Resource Strain (CARDIA)    Difficulty of Paying Living Expenses: Not hard at all  Food Insecurity: No Food Insecurity (02/02/2024)   Hunger Vital Sign    Worried About Running Out of Food in the Last Year: Never true    Ran Out of Food in the Last Year: Never true  Transportation Needs: No Transportation Needs (02/02/2024)   PRAPARE - Administrator, Civil Service (Medical): No    Lack of Transportation (Non-Medical): No  Physical Activity: Inactive (02/02/2024)   Exercise Vital Sign    Days of Exercise per Week: 0 days    Minutes of Exercise per Session: 0 min  Stress: No Stress Concern Present (02/02/2024)   [employer] of Occupational Health - Occupational Stress Questionnaire    Feeling of Stress: Not at all  Social Connections: Socially Isolated (02/02/2024)   Social Connection and Isolation Panel    Frequency of Communication with Friends and Family: More than three times a week    Frequency of Social Gatherings with Friends and Family: Once a week    Attends Religious Services: Never    Database Administrator or Organizations: No    Attends Banker Meetings: Never    Marital Status: Never married  Intimate Partner Violence: Not At Risk (02/02/2024)   Humiliation, Afraid, Rape, and Kick questionnaire    Fear of Current or Ex-Partner: No    Emotionally Abused: No    Physically Abused: No    Sexually Abused: No    Family History  Problem Relation Age of Onset   Depression Mother    Hypertension Mother    Depression Father        Hx: PTSD   Diabetes Maternal Aunt    AVM Maternal Grandmother    Aneurysm Maternal Grandmother    Seizures Maternal Grandmother    Stomach cancer Neg Hx    Pancreatic cancer Neg Hx    Colon cancer Neg Hx    Liver disease Neg Hx    Esophageal cancer Neg Hx    Rectal cancer Neg Hx      Review of Systems   Constitutional: Negative.  Negative for chills and fever.  HENT: Negative.  Negative for congestion and sore throat.   Respiratory: Negative.  Negative for cough and shortness of breath.   Cardiovascular: Negative.  Negative for chest pain and palpitations.  Gastrointestinal:  Negative for abdominal pain, diarrhea, nausea and vomiting.  Genitourinary: Negative.  Negative for dysuria and hematuria.  Skin: Negative.  Negative for rash.  Neurological: Negative.  Negative for dizziness and headaches.  All other systems reviewed and are negative.   Vitals:   03/05/24 1040  BP: 126/68  Pulse: (!) 102  Temp: 98.7 F (37.1 C)  SpO2: 97%    Physical Exam Vitals  reviewed.  Constitutional:      Appearance: Normal appearance.  HENT:     Head: Normocephalic.  Eyes:     Extraocular Movements: Extraocular movements intact.  Cardiovascular:     Rate and Rhythm: Normal rate.  Pulmonary:     Effort: Pulmonary effort is normal.  Skin:    General: Skin is warm and dry.     Comments: Tender right nipple area with palpable small soft tissue mass  Neurological:     Mental Status: He is alert and oriented to person, place, and time.  Psychiatric:        Mood and Affect: Mood normal.        Behavior: Behavior normal.      ASSESSMENT & PLAN: I personally spent a total of 30 minutes in the care of the patient today including preparing to see the patient, getting/reviewing separately obtained history, performing a medically appropriate exam/evaluation, counseling and educating, placing orders, documenting clinical information in the EHR, coordinating care, and need for diagnostic breast ultrasound, prognosis and need for follow-up.  Problem List Items Addressed This Visit       Other   Mass of nipple - Primary   Clinically stable.  No red flag signs or symptoms Differential diagnosis discussed Recommend breast ultrasound Most likely gynecomastia medication induced Will follow-up after  ultrasound      Relevant Orders   [place]  LIMITED ULTRASOUND INCLUDING AXILLA RIGHT BREAST   Patient Instructions  Health Maintenance, Male Adopting a healthy lifestyle and getting preventive care are important in promoting health and wellness. Ask your health care provider about: The right schedule for you to have regular tests and exams. Things you can do on your own to prevent diseases and keep yourself healthy. What should I know about diet, weight, and exercise? Eat a healthy diet  Eat a diet that includes plenty of vegetables, fruits, low-fat dairy products, and lean protein. Do not eat a lot of foods that are high in solid fats, added sugars, or sodium. Maintain a healthy weight Body mass index (BMI) is a measurement that can be used to identify possible weight problems. It estimates body fat based on height and weight. Your health care provider can help determine your BMI and help you achieve or maintain a healthy weight. Get regular exercise Get regular exercise. This is one of the most important things you can do for your health. Most adults should: Exercise for at least 150 minutes each week. The exercise should increase your heart rate and make you sweat (moderate-intensity exercise). Do strengthening exercises at least twice a week. This is in addition to the moderate-intensity exercise. Spend less time sitting. Even light physical activity can be beneficial. Watch cholesterol and blood lipids Have your blood tested for lipids and cholesterol at 27 years of age, then have this test every 5 years. You may need to have your cholesterol levels checked more often if: Your lipid or cholesterol levels are high. You are older than 27 years of age. You are at high risk for heart disease. What should I know about cancer screening? Many types of cancers can be detected early and may often be prevented. Depending on your health history and family history, you may need to have cancer  screening at various ages. This may include screening for: Colorectal cancer. Prostate cancer. Skin cancer. Lung cancer. What should I know about heart disease, diabetes, and high blood pressure? Blood pressure and heart disease High blood pressure causes heart disease and  increases the risk of stroke. This is more likely to develop in people who have high blood pressure readings or are overweight. Talk with your health care provider about your target blood pressure readings. Have your blood pressure checked: Every 3-5 years if you are 55-24 years of age. Every year if you are 68 years old or older. If you are between the ages of 68 and 83 and are a current or former smoker, ask your health care provider if you should have a one-time screening for abdominal aortic aneurysm (AAA). Diabetes Have regular diabetes screenings. This checks your fasting blood sugar level. Have the screening done: Once every three years after age 61 if you are at a normal weight and have a low risk for diabetes. More often and at a younger age if you are overweight or have a high risk for diabetes. What should I know about preventing infection? Hepatitis B If you have a higher risk for hepatitis B, you should be screened for this virus. Talk with your health care provider to find out if you are at risk for hepatitis B infection. Hepatitis C Blood testing is recommended for: Everyone born from [birth year] through 1964. Anyone with known risk factors for hepatitis C. Sexually transmitted infections (STIs) You should be screened each year for STIs, including gonorrhea and chlamydia, if: You are sexually active and are younger than 27 years of age. You are older than 27 years of age and your health care provider tells you that you are at risk for this type of infection. Your sexual activity has changed since you were last screened, and you are at increased risk for chlamydia or gonorrhea. Ask your health care provider if  you are at risk. Ask your health care provider about whether you are at high risk for HIV. Your health care provider may recommend a prescription medicine to help prevent HIV infection. If you choose to take medicine to prevent HIV, you should first get tested for HIV. You should then be tested every 3 months for as long as you are taking the medicine. Follow these instructions at home: Alcohol use Do not drink alcohol if your health care provider tells you not to drink. If you drink alcohol: Limit how much you have to 0-2 drinks a day. Know how much alcohol is in your drink. In the [place] one drink equals one 12 oz bottle of beer (355 mL), one 5 oz glass of wine (148 mL), or one 1 oz glass of hard liquor (44 mL). Lifestyle Do not use any products that contain nicotine  or tobacco. These products include cigarettes, chewing tobacco, and vaping devices, such as e-cigarettes. If you need help quitting, ask your health care provider. Do not use street drugs. Do not share needles. Ask your health care provider for help if you need support or information about quitting drugs. General instructions Schedule regular health, dental, and eye exams. Stay current with your vaccines. Tell your health care provider if: You often feel depressed. You have ever been abused or do not feel safe at home. Summary Adopting a healthy lifestyle and getting preventive care are important in promoting health and wellness. Follow your health care provider's instructions about healthy diet, exercising, and getting tested or screened for diseases. Follow your health care provider's instructions on monitoring your cholesterol and blood pressure. This information is not intended to replace advice given to you by your health care provider. Make sure you discuss any questions you have with your health  care provider. Document Revised: 09/07/2020 Document Reviewed: 09/07/2020 Elsevier Patient Education  2024 [employer]  [employer].   Emil Schaumann, MD [facility]

## 2024-03-05 NOTE — Patient Instructions (Signed)
 Health Maintenance, Male  Adopting a healthy lifestyle and getting preventive care are important in promoting health and wellness. Ask your health care provider about:  The right schedule for you to have regular tests and exams.  Things you can do on your own to prevent diseases and keep yourself healthy.  What should I know about diet, weight, and exercise?  Eat a healthy diet    Eat a diet that includes plenty of vegetables, fruits, low-fat dairy products, and lean protein.  Do not eat a lot of foods that are high in solid fats, added sugars, or sodium.  Maintain a healthy weight  Body mass index (BMI) is a measurement that can be used to identify possible weight problems. It estimates body fat based on height and weight. Your health care provider can help determine your BMI and help you achieve or maintain a healthy weight.  Get regular exercise  Get regular exercise. This is one of the most important things you can do for your health. Most adults should:  Exercise for at least 150 minutes each week. The exercise should increase your heart rate and make you sweat (moderate-intensity exercise).  Do strengthening exercises at least twice a week. This is in addition to the moderate-intensity exercise.  Spend less time sitting. Even light physical activity can be beneficial.  Watch cholesterol and blood lipids  Have your blood tested for lipids and cholesterol at 27 years of age, then have this test every 5 years.  You may need to have your cholesterol levels checked more often if:  Your lipid or cholesterol levels are high.  You are older than 27 years of age.  You are at high risk for heart disease.  What should I know about cancer screening?  Many types of cancers can be detected early and may often be prevented. Depending on your health history and family history, you may need to have cancer screening at various ages. This may include screening for:  Colorectal cancer.  Prostate cancer.  Skin cancer.  Lung  cancer.  What should I know about heart disease, diabetes, and high blood pressure?  Blood pressure and heart disease  High blood pressure causes heart disease and increases the risk of stroke. This is more likely to develop in people who have high blood pressure readings or are overweight.  Talk with your health care provider about your target blood pressure readings.  Have your blood pressure checked:  Every 3-5 years if you are 24-52 years of age.  Every year if you are 3 years old or older.  If you are between the ages of 60 and 72 and are a current or former smoker, ask your health care provider if you should have a one-time screening for abdominal aortic aneurysm (AAA).  Diabetes  Have regular diabetes screenings. This checks your fasting blood sugar level. Have the screening done:  Once every three years after age 66 if you are at a normal weight and have a low risk for diabetes.  More often and at a younger age if you are overweight or have a high risk for diabetes.  What should I know about preventing infection?  Hepatitis B  If you have a higher risk for hepatitis B, you should be screened for this virus. Talk with your health care provider to find out if you are at risk for hepatitis B infection.  Hepatitis C  Blood testing is recommended for:  Everyone born from [birth year] through 1964.  Anyone  with known risk factors for hepatitis C.  Sexually transmitted infections (STIs)  You should be screened each year for STIs, including gonorrhea and chlamydia, if:  You are sexually active and are younger than 27 years of age.  You are older than 27 years of age and your health care provider tells you that you are at risk for this type of infection.  Your sexual activity has changed since you were last screened, and you are at increased risk for chlamydia or gonorrhea. Ask your health care provider if you are at risk.  Ask your health care provider about whether you are at high risk for HIV. Your health care provider  may recommend a prescription medicine to help prevent HIV infection. If you choose to take medicine to prevent HIV, you should first get tested for HIV. You should then be tested every 3 months for as long as you are taking the medicine.  Follow these instructions at home:  Alcohol use  Do not drink alcohol if your health care provider tells you not to drink.  If you drink alcohol:  Limit how much you have to 0-2 drinks a day.  Know how much alcohol is in your drink. In the [place] one drink equals one 12 oz bottle of beer (355 mL), one 5 oz glass of wine (148 mL), or one 1 oz glass of hard liquor (44 mL).  Lifestyle  Do not use any products that contain nicotine or tobacco. These products include cigarettes, chewing tobacco, and vaping devices, such as e-cigarettes. If you need help quitting, ask your health care provider.  Do not use street drugs.  Do not share needles.  Ask your health care provider for help if you need support or information about quitting drugs.  General instructions  Schedule regular health, dental, and eye exams.  Stay current with your vaccines.  Tell your health care provider if:  You often feel depressed.  You have ever been abused or do not feel safe at home.  Summary  Adopting a healthy lifestyle and getting preventive care are important in promoting health and wellness.  Follow your health care provider's instructions about healthy diet, exercising, and getting tested or screened for diseases.  Follow your health care provider's instructions on monitoring your cholesterol and blood pressure.  This information is not intended to replace advice given to you by your health care provider. Make sure you discuss any questions you have with your health care provider.  Document Revised: 09/07/2020 Document Reviewed: 09/07/2020  Elsevier Patient Education  2024 [employer].

## 2024-03-05 NOTE — Assessment & Plan Note (Signed)
 Clinically stable.  No red flag signs or symptoms Differential diagnosis discussed Recommend breast ultrasound Most likely gynecomastia medication induced Will follow-up after ultrasound

## 2024-03-07 ENCOUNTER — Ambulatory Visit: Admitting: "Endocrinology

## 2024-03-21 ENCOUNTER — Telehealth (HOSPITAL_COMMUNITY): Admitting: Psychiatry

## 2024-04-11 ENCOUNTER — Encounter (HOSPITAL_COMMUNITY): Payer: Self-pay

## 2024-04-11 ENCOUNTER — Telehealth (INDEPENDENT_AMBULATORY_CARE_PROVIDER_SITE_OTHER): Admitting: Psychiatry

## 2024-04-11 ENCOUNTER — Encounter (HOSPITAL_COMMUNITY): Payer: Self-pay | Admitting: Psychiatry

## 2024-04-11 DIAGNOSIS — F251 Schizoaffective disorder, depressive type: Secondary | ICD-10-CM | POA: Diagnosis not present

## 2024-04-11 DIAGNOSIS — F411 Generalized anxiety disorder: Secondary | ICD-10-CM

## 2024-04-11 MED ORDER — PALIPERIDONE ER 3 MG PO TB24: 3.0000 mg | ORAL_TABLET | Freq: Every day | ORAL | 3 refills | Status: AC

## 2024-04-11 MED ORDER — HYDROXYZINE HCL 50 MG PO TABS: 50.0000 mg | ORAL_TABLET | Freq: Three times a day (TID) | ORAL | 3 refills | Status: AC | PRN

## 2024-04-11 MED ORDER — MIRTAZAPINE 45 MG PO TBDP: 45.0000 mg | ORAL_TABLET | Freq: Every day | ORAL | 3 refills | Status: AC

## 2024-04-11 NOTE — Progress Notes (Signed)
 [facility] MD/PA/NP OP Progress Note Virtual Visit via Video Note  I connected with Devon Hernandez on 04/11/2024 at  3:30 PM EST by a video enabled telemedicine application and verified that I am speaking with the correct person using two identifiers.  Location: Patient: Home Provider: Clinic   I discussed the limitations of evaluation and management by telemedicine and the availability of in person appointments. The patient expressed understanding and agreed to proceed.  I provided 30 minutes of non-face-to-face time during this encounter.    04/11/2024 4:11 PM Devon Hernandez  MRN:  [id number]  Chief Complaint: I don't like Seroquel   HPI: 28 year old male seen today for follow up psychiatric evaluation. He has a psychiatric history of  Paranoid Schizophrenia, PTSD, ODD, Schizoaffective disorder, ADHD, borderline personality disorder, Meth Abuse, Suicide Attempts and 3 Psychiatric Hospitalizations ( age 69 prior to his admission at [facility] on 09/21/2022). He is currently managed on Mirtazapine  45 milligrams nightly, hydroxyzine  50-100 mg nightly, and Seroquel  50 mg daily. He informed clinical research associate that he no longer wants to take Seroquel .    Today he was well-groomed, pleasant, cooperative, and engaged in conversation.  Patient informed clinical research associate that he disliked Seroquel . He notes  he would like to go back on Invega  oral pills. He informed clinical research associate that he is having a hard time controlling his emotions. He notes that his emotions don't work fully. He describes going for laughter to tears. He also notes that he has been seeing shadows. Patient notes that he stay up at night and sleep in the day.   Patient reports that he still have a lump on his skin.  He showed provider today.  1 side appears to be swollen.  He does note that he has an upcoming appointment to have this evaluated.  Patient informed clinical research associate that he is on his way to the [place].  He notes that he has plans to spend the next couple of days with his  mother and other family members.  His anxiety and depression are well-managed. Today provider conducted a GAD-7 and patient scored a 4 last visit he scored 8.  Provider also conducted PHQ-9 the patient scored 2, at his last visit he scored a 6.  He denies SI/HI/AH, mania, paranoia.  Today patient agreeable to starting Invega  3 mg daily to help manage symptoms of psychosis and mood.  He will continue mirtazapine  as prescribed.  At this time he does not wish to restart Seroquel .  Potential side effects of medication and risks vs benefits of treatment vs non-treatment were explained and discussed. All questions were answered. No other concern for at this time.    Visit Diagnosis:    ICD-10-CM   1. Generalized anxiety disorder  F41.1 mirtazapine  (REMERON  SOL-TAB) 45 MG disintegrating tablet    hydrOXYzine  (ATARAX ) 50 MG tablet    2. Schizoaffective disorder, depressive type (HCC)  F25.1 paliperidone  (INVEGA ) 3 MG 24 hr tablet    mirtazapine  (REMERON  SOL-TAB) 45 MG disintegrating tablet            Past Psychiatric History: Paranoid Schizophrenia, PTSD, ODD, Schizoaffective disorder, ADHD, borderline personality disorder, and Meth Abuse, and 4 Suicide Attempts and 3 Psychiatric Hospitalizations (last age 29 prior to his admission at [facility] on 09/21/2022)   Past Medical History:  Past Medical History:  Diagnosis Date   ADHD (attention deficit hyperactivity disorder)    Allergy    Anxiety    Bipolar disorder (HCC)    Depression    Headache(784.0)  HIV (human immunodeficiency virus infection) (HCC) 07/05/2018   Immune deficiency disorder    Rectal abscess     Past Surgical History:  Procedure Laterality Date   TYMPANOSTOMY TUBE PLACEMENT      Family Psychiatric History:  Mother- Depression, Father- Depression and PTSD   Family History:  Family History  Problem Relation Age of Onset   Depression Mother    Hypertension Mother    Depression Father        Hx: PTSD   Diabetes  Maternal Aunt    AVM Maternal Grandmother    Aneurysm Maternal Grandmother    Seizures Maternal Grandmother    Stomach cancer Neg Hx    Pancreatic cancer Neg Hx    Colon cancer Neg Hx    Liver disease Neg Hx    Esophageal cancer Neg Hx    Rectal cancer Neg Hx     Social History:  Social History   Socioeconomic History   Marital status: Single    Spouse name: Not on file   Number of children: Not on file   Years of education: Not on file   Highest education level: Not on file  Occupational History   Not on file  Tobacco Use   Smoking status: Every Day    Types: Cigars   Smokeless tobacco: Never   Tobacco comments:    Smokes 2 cigars a day but trying to quit cutting back.   Vaping Use   Vaping status: Former  Substance and Sexual Activity   Alcohol use: Not Currently   Drug use: Yes    Frequency: 7.0 times per week    Types: Marijuana   Sexual activity: Not Currently    Comment: DECLINED CONDOMS  Other Topics Concern   Not on file  Social History Narrative   Mom currently has full custody but Cordie has gone to live with his dad. Tonight was the first night that he was at dad's house. He admitted to marijuana use tonight, unclear when. Smoke exposure at home. No pets at home.   Social Drivers of Health   Tobacco Use: High Risk (04/11/2024)   Patient History    Smoking Tobacco Use: Every Day    Smokeless Tobacco Use: Never    Passive Exposure: Not on file  Financial Resource Strain: Low Risk (02/02/2024)   Overall Financial Resource Strain (CARDIA)    Difficulty of Paying Living Expenses: Not hard at all  Food Insecurity: No Food Insecurity (02/02/2024)   Epic    Worried About Programme Researcher, Broadcasting/film/video in the Last Year: Never true    Ran Out of Food in the Last Year: Never true  Transportation Needs: No Transportation Needs (02/02/2024)   Epic    Lack of Transportation (Medical): No    Lack of Transportation (Non-Medical): No  Physical Activity: Inactive (02/02/2024)    Exercise Vital Sign    Days of Exercise per Week: 0 days    Minutes of Exercise per Session: 0 min  Stress: No Stress Concern Present (02/02/2024)   [employer] of Occupational Health - Occupational Stress Questionnaire    Feeling of Stress: Not at all  Social Connections: Socially Isolated (02/02/2024)   Social Connection and Isolation Panel    Frequency of Communication with Friends and Family: More than three times a week    Frequency of Social Gatherings with Friends and Family: Once a week    Attends Religious Services: Never    Database Administrator or Organizations: No  Attends Banker Meetings: Never    Marital Status: Never married  Depression (PHQ2-9): Low Risk (04/11/2024)   Depression (PHQ2-9)    PHQ-2 Score: 2  Recent Concern: Depression (PHQ2-9) - Medium Risk (02/27/2024)   Depression (PHQ2-9)    PHQ-2 Score: 6  Alcohol Screen: Low Risk (02/02/2024)   Alcohol Screen    Last Alcohol Screening Score (AUDIT): 2  Housing: Unknown (02/02/2024)   Epic    Unable to Pay for Housing in the Last Year: No    Number of Times Moved in the Last Year: Not on file    Homeless in the Last Year: No  Utilities: Not At Risk (02/02/2024)   Epic    Threatened with loss of utilities: No  Health Literacy: Adequate Health Literacy (02/02/2024)   B1300 Health Literacy    Frequency of need for help with medical instructions: Never    Allergies: No Known Allergies  Metabolic Disorder Labs: Lab Results  Component Value Date   HGBA1C 5.5 06/06/2023   MPG 111.15 09/21/2022   MPG 117 (H) 01/19/2013   Lab Results  Component Value Date   PROLACTIN 36.3 (H) 12/01/2023   PROLACTIN 55.1 (H) 09/04/2023   Lab Results  Component Value Date   CHOL 186 10/09/2023   TRIG 105 10/09/2023   HDL 44 10/09/2023   CHOLHDL 4.2 10/09/2023   VLDL 68.3 06/06/2023   LDLCALC 121 (H) 10/09/2023   LDLCALC 77 06/06/2023   Lab Results  Component Value Date   TSH 1.030 06/12/2023    TSH 0.487 09/21/2022    Therapeutic Level Labs: No results found for: LITHIUM  No results found for: VALPROATE No results found for: CBMZ  Current Medications: Current Outpatient Medications  Medication Sig Dispense Refill   paliperidone  (INVEGA ) 3 MG 24 hr tablet Take 1 tablet (3 mg total) by mouth daily. 30 tablet 3   acetaminophen  (TYLENOL ) 325 MG tablet Take 650 mg by mouth every 6 (six) hours as needed.     bictegravir-emtricitabine -tenofovir  AF (BIKTARVY ) 50-200-25 MG TABS tablet Take 1 tablet by mouth daily. 90 tablet 3   hydrOXYzine  (ATARAX ) 50 MG tablet Take 1-2 tablets (50-100 mg total) by mouth 3 (three) times daily as needed. 180 tablet 3   ibuprofen  (ADVIL ) 200 MG tablet Take 600 mg by mouth every 6 (six) hours as needed.     mirtazapine  (REMERON  SOL-TAB) 45 MG disintegrating tablet Take 1 tablet (45 mg total) by mouth at bedtime. 30 tablet 3   scopolamine  (TRANSDERM-SCOP) 1 MG/3DAYS Place 1 patch (1 mg total) onto the skin every 3 (three) days. 10 patch 12   senna-docusate (SENOKOT-S) 8.6-50 MG tablet Take 1 tablet by mouth as directed.     Current Facility-Administered Medications  Medication Dose Route Frequency Provider Last Rate Last Admin   mupirocin  ointment (BACTROBAN ) 2 %   Topical BID Purcell Emil Schanz, MD         Musculoskeletal: Strength & Muscle Tone: within normal limits and telehealth visit Gait & Station: normal, telehealth visit Patient leans: N/A  Psychiatric Specialty Exam: Review of Systems  There were no vitals taken for this visit.There is no height or weight on file to calculate BMI.  General Appearance: Well Groomed  Eye Contact:  Good  Speech:  Clear and Coherent and Normal Rate  Volume:  Normal  Mood:  Euthymic  Affect:  Appropriate, Congruent, and Tearful  Thought Process:  Coherent, Goal Directed, and Linear  Orientation:  Full (Time, Place, and Person)  Thought  Content: WDL and Logical   Suicidal Thoughts:  No  Homicidal  Thoughts:  No  Memory:  Immediate;   Good Recent;   Good Remote;   Good  Judgement:  Good  Insight:  Good  Psychomotor Activity:  Normal  Concentration:  Concentration: Good and Attention Span: Good  Recall:  Good  Fund of Knowledge: Good  Language: Good  Akathisia:  No  Handed:  Right  AIMS (if indicated): not done  Assets:  Communication Skills Desire for Improvement Financial Resources/Insurance Housing Intimacy Leisure Time Physical Health Social Support Vocational/Educational  ADL's:  Intact  Cognition: WNL  Sleep:  Good   Screenings: AIMS    Flowsheet Row Video [facility] 11[facility]24/20[facility] in [facility]  AIMS Total Score 1   AUDIT    Flowsheet Row [facility] Ad[facility] ([facility]  Alcohol Use Disorder Identification Test Final Score (AUDIT) 0 0   GAD-7    Flowsheet Row Video Visit from 04/11/2024 in [facility] Video Visit from 02/27/2024 in [facility] Video Visit from 01/24/2024 in [facility] Office Visit from 12/14/2023 in [facility] Video Visit from 11/24/2023 in [facility]  Total GAD-7 Score 4 8 4 3 13    PHQ2-9    Flowsheet Row Video Visit from 04/11/2024 in [facility] Video Visit from 02/27/2024 in [facility] Clinical Support from 02/02/2024 in [facility] Video Visit from 01/24/2024 in [facility] Office Visit from 12/14/2023 in [facility] [place]  PHQ-2 Total Score 1 2 0 0 2  PHQ-9 Total Score 2 6 1 2 5    Flowsheet Row Video Visit from 11/24/2023 in [facility] Video  Visit from 09/13/2023 in [facility] UC from 08/07/2023 in [facility])  C-SSRS RISK CATEGORY Moderate Risk Error: Q3, 4, or 5 should not be populated when Q2 is No Moderate Risk     Assessment and Plan: Patient reports that his mood, anxiety, and depression continues to be well-managed.  He however is concerned fluctuating emotions and psychosis. Today patient agreeable to starting Invega  3 mg daily to help manage symptoms of psychosis and mood.  He will continue mirtazapine  as prescribed.  At this time he does not wish to restart Seroquel .    1. Generalized anxiety disorder  Continue- mirtazapine  (REMERON  SOL-TAB) 45 MG disintegrating tablet; Take 1 tablet (45 mg total) by mouth at bedtime.  Dispense: 30 tablet; Refill: 3 Continue- hydrOXYzine  (ATARAX ) 50 MG tablet; Take 1-2 tablets (50-100 mg total) by mouth 3 (three) times daily as needed.  Dispense: 180 tablet; Refill: 3  2. Schizoaffective disorder, depressive type (HCC)  Start- paliperidone  (INVEGA ) 3 MG 24 hr tablet; Take 1 tablet (3 mg total) by mouth daily.  Dispense: 30 tablet; Refill: 3 Continue- mirtazapine  (REMERON  SOL-TAB) 45 MG disintegrating tablet; Take 1 tablet (45 mg total) by mouth at bedtime.  Dispense: 30 tablet; Refill: 3   Collaboration of Care: Collaboration of Care: Other provider involved in patient's care AEB PCP  Patient/Guardian was advised Release of Information must be obtained prior to any record release in order to collaborate their care with an outside provider. Patient/Guardian was  advised if they have not already done so to contact the registration department to sign all necessary forms in order for us  to release information regarding their care.   Consent: Patient/Guardian gives verbal consent for treatment and assignment of benefits for services provided during this visit. Patient/Guardian expressed understanding and agreed to proceed.    Follow up in 2 months Zane FORBES Bach, NP 04/11/2024, 4:11 PM

## 2024-04-12 ENCOUNTER — Other Ambulatory Visit: Payer: Self-pay | Admitting: Emergency Medicine

## 2024-04-12 DIAGNOSIS — N63 Unspecified lump in unspecified breast: Secondary | ICD-10-CM

## 2024-04-16 ENCOUNTER — Encounter

## 2024-04-16 ENCOUNTER — Other Ambulatory Visit

## 2024-04-17 ENCOUNTER — Other Ambulatory Visit

## 2024-05-20 ENCOUNTER — Encounter: Admitting: Emergency Medicine

## 2024-06-20 ENCOUNTER — Telehealth (HOSPITAL_COMMUNITY): Admitting: Psychiatry

## 2025-02-03 ENCOUNTER — Ambulatory Visit
